# Patient Record
Sex: Male | Born: 1945
Health system: Southern US, Community
[De-identification: ages and names within clinical notes are randomized; demographics above are authoritative.]

## PROBLEM LIST (undated history)

## (undated) DIAGNOSIS — Z974 Presence of external hearing-aid: Secondary | ICD-10-CM

## (undated) DIAGNOSIS — C439 Malignant melanoma of skin, unspecified: Secondary | ICD-10-CM

## (undated) DIAGNOSIS — C61 Malignant neoplasm of prostate: Secondary | ICD-10-CM

## (undated) DIAGNOSIS — K5792 Diverticulitis of intestine, part unspecified, without perforation or abscess without bleeding: Secondary | ICD-10-CM

## (undated) DIAGNOSIS — E785 Hyperlipidemia, unspecified: Secondary | ICD-10-CM

## (undated) DIAGNOSIS — Z973 Presence of spectacles and contact lenses: Secondary | ICD-10-CM

## (undated) DIAGNOSIS — I10 Essential (primary) hypertension: Secondary | ICD-10-CM

## (undated) DIAGNOSIS — R972 Elevated prostate specific antigen [PSA]: Secondary | ICD-10-CM

## (undated) HISTORY — PX: OTHER SURGICAL HISTORY: SHX169

## (undated) HISTORY — DX: Essential (primary) hypertension: I10

## (undated) HISTORY — PX: SPINE SURGERY: SHX786

## (undated) HISTORY — PX: EYE SURGERY: SHX253

## (undated) HISTORY — DX: Hyperlipidemia, unspecified: E78.5

## (undated) HISTORY — DX: Diverticulitis of intestine, part unspecified, without perforation or abscess without bleeding: K57.92

---

## 1997-12-19 ENCOUNTER — Other Ambulatory Visit: Admission: RE | Admit: 1997-12-19 | Discharge: 1997-12-19 | Payer: Self-pay | Admitting: Family Medicine

## 1997-12-19 ENCOUNTER — Encounter: Admission: RE | Admit: 1997-12-19 | Discharge: 1997-12-19 | Payer: Self-pay | Admitting: Sports Medicine

## 1998-05-18 ENCOUNTER — Ambulatory Visit (HOSPITAL_COMMUNITY): Admission: RE | Admit: 1998-05-18 | Discharge: 1998-05-19 | Payer: Self-pay | Admitting: Ophthalmology

## 1998-05-18 ENCOUNTER — Encounter: Payer: Self-pay | Admitting: Ophthalmology

## 1999-01-11 ENCOUNTER — Encounter: Admission: RE | Admit: 1999-01-11 | Discharge: 1999-01-11 | Payer: Self-pay | Admitting: Family Medicine

## 1999-02-23 ENCOUNTER — Encounter: Admission: RE | Admit: 1999-02-23 | Discharge: 1999-02-23 | Payer: Self-pay | Admitting: Family Medicine

## 1999-03-13 ENCOUNTER — Encounter: Admission: RE | Admit: 1999-03-13 | Discharge: 1999-03-13 | Payer: Self-pay | Admitting: Family Medicine

## 1999-06-07 ENCOUNTER — Encounter: Admission: RE | Admit: 1999-06-07 | Discharge: 1999-06-07 | Payer: Self-pay | Admitting: Family Medicine

## 1999-09-07 ENCOUNTER — Encounter: Admission: RE | Admit: 1999-09-07 | Discharge: 1999-09-07 | Payer: Self-pay | Admitting: Family Medicine

## 2000-09-08 ENCOUNTER — Encounter: Admission: RE | Admit: 2000-09-08 | Discharge: 2000-09-08 | Payer: Self-pay | Admitting: Family Medicine

## 2000-09-09 ENCOUNTER — Encounter: Admission: RE | Admit: 2000-09-09 | Discharge: 2000-09-09 | Payer: Self-pay | Admitting: Sports Medicine

## 2001-06-04 ENCOUNTER — Encounter: Admission: RE | Admit: 2001-06-04 | Discharge: 2001-06-04 | Payer: Self-pay | Admitting: Family Medicine

## 2001-06-04 ENCOUNTER — Encounter: Payer: Self-pay | Admitting: Family Medicine

## 2001-06-26 ENCOUNTER — Encounter: Admission: RE | Admit: 2001-06-26 | Discharge: 2001-09-24 | Payer: Self-pay | Admitting: Family Medicine

## 2001-09-21 ENCOUNTER — Encounter: Admission: RE | Admit: 2001-09-21 | Discharge: 2001-09-21 | Payer: Self-pay | Admitting: Family Medicine

## 2001-09-21 ENCOUNTER — Ambulatory Visit (HOSPITAL_COMMUNITY): Admission: RE | Admit: 2001-09-21 | Discharge: 2001-09-21 | Payer: Self-pay | Admitting: Family Medicine

## 2001-09-22 ENCOUNTER — Encounter: Admission: RE | Admit: 2001-09-22 | Discharge: 2001-09-22 | Payer: Self-pay | Admitting: Family Medicine

## 2001-10-20 ENCOUNTER — Encounter: Admission: RE | Admit: 2001-10-20 | Discharge: 2001-10-20 | Payer: Self-pay | Admitting: Family Medicine

## 2001-10-28 ENCOUNTER — Encounter: Payer: Self-pay | Admitting: Neurology

## 2001-10-28 ENCOUNTER — Ambulatory Visit (HOSPITAL_COMMUNITY): Admission: RE | Admit: 2001-10-28 | Discharge: 2001-10-28 | Payer: Self-pay | Admitting: Neurology

## 2001-11-02 ENCOUNTER — Encounter: Admission: RE | Admit: 2001-11-02 | Discharge: 2001-11-02 | Payer: Self-pay | Admitting: Family Medicine

## 2001-11-12 ENCOUNTER — Encounter: Payer: Self-pay | Admitting: Neurology

## 2001-11-12 ENCOUNTER — Ambulatory Visit (HOSPITAL_COMMUNITY): Admission: RE | Admit: 2001-11-12 | Discharge: 2001-11-12 | Payer: Self-pay | Admitting: Neurology

## 2002-09-21 ENCOUNTER — Encounter: Admission: RE | Admit: 2002-09-21 | Discharge: 2002-09-21 | Payer: Self-pay | Admitting: Family Medicine

## 2002-09-24 ENCOUNTER — Encounter: Admission: RE | Admit: 2002-09-24 | Discharge: 2002-09-24 | Payer: Self-pay | Admitting: Family Medicine

## 2003-09-20 ENCOUNTER — Encounter: Admission: RE | Admit: 2003-09-20 | Discharge: 2003-09-20 | Payer: Self-pay | Admitting: Family Medicine

## 2003-09-23 ENCOUNTER — Encounter: Admission: RE | Admit: 2003-09-23 | Discharge: 2003-09-23 | Payer: Self-pay | Admitting: Family Medicine

## 2004-09-17 ENCOUNTER — Ambulatory Visit: Payer: Self-pay | Admitting: Family Medicine

## 2004-09-26 ENCOUNTER — Ambulatory Visit: Payer: Self-pay | Admitting: Family Medicine

## 2004-10-08 ENCOUNTER — Ambulatory Visit: Payer: Self-pay | Admitting: Family Medicine

## 2005-09-04 ENCOUNTER — Ambulatory Visit: Payer: Self-pay | Admitting: Family Medicine

## 2005-09-06 ENCOUNTER — Ambulatory Visit: Payer: Self-pay | Admitting: Family Medicine

## 2005-09-10 ENCOUNTER — Ambulatory Visit (HOSPITAL_COMMUNITY): Admission: RE | Admit: 2005-09-10 | Discharge: 2005-09-10 | Payer: Self-pay | Admitting: Family Medicine

## 2006-01-03 ENCOUNTER — Ambulatory Visit: Payer: Self-pay | Admitting: Family Medicine

## 2006-03-06 ENCOUNTER — Ambulatory Visit: Payer: Self-pay | Admitting: Family Medicine

## 2006-09-18 DIAGNOSIS — G8929 Other chronic pain: Secondary | ICD-10-CM | POA: Insufficient documentation

## 2006-09-18 DIAGNOSIS — M5382 Other specified dorsopathies, cervical region: Secondary | ICD-10-CM

## 2006-09-18 DIAGNOSIS — N400 Enlarged prostate without lower urinary tract symptoms: Secondary | ICD-10-CM | POA: Insufficient documentation

## 2006-09-18 DIAGNOSIS — I10 Essential (primary) hypertension: Secondary | ICD-10-CM | POA: Insufficient documentation

## 2006-09-18 DIAGNOSIS — K5732 Diverticulitis of large intestine without perforation or abscess without bleeding: Secondary | ICD-10-CM | POA: Insufficient documentation

## 2006-09-18 DIAGNOSIS — M545 Low back pain: Secondary | ICD-10-CM

## 2006-09-18 DIAGNOSIS — E785 Hyperlipidemia, unspecified: Secondary | ICD-10-CM | POA: Insufficient documentation

## 2006-09-18 DIAGNOSIS — E7800 Pure hypercholesterolemia, unspecified: Secondary | ICD-10-CM

## 2006-09-18 HISTORY — DX: Pure hypercholesterolemia, unspecified: E78.00

## 2006-09-18 HISTORY — DX: Diverticulitis of large intestine without perforation or abscess without bleeding: K57.32

## 2006-09-18 HISTORY — DX: Other specified dorsopathies, cervical region: M53.82

## 2006-09-29 ENCOUNTER — Encounter: Payer: Self-pay | Admitting: Family Medicine

## 2006-09-29 ENCOUNTER — Ambulatory Visit: Payer: Self-pay | Admitting: Family Medicine

## 2006-09-29 LAB — CONVERTED CEMR LAB
ALT: 10 units/L (ref 0–53)
AST: 16 units/L (ref 0–37)
Albumin: 4.2 g/dL (ref 3.5–5.2)
Alkaline Phosphatase: 76 units/L (ref 39–117)
BUN: 15 mg/dL (ref 6–23)
CO2: 25 meq/L (ref 19–32)
Calcium: 9.2 mg/dL (ref 8.4–10.5)
Chloride: 107 meq/L (ref 96–112)
Cholesterol: 139 mg/dL (ref 0–200)
Creatinine, Ser: 0.96 mg/dL (ref 0.40–1.50)
Glucose, Bld: 107 mg/dL — ABNORMAL HIGH (ref 70–99)
HDL: 48 mg/dL (ref >39)
LDL Cholesterol: 77 mg/dL (ref 0–99)
PSA: 2 ng/mL (ref 0.10–4.00)
Potassium: 4.4 meq/L (ref 3.5–5.3)
Sodium: 145 meq/L (ref 135–145)
Total Bilirubin: 0.7 mg/dL (ref 0.3–1.2)
Total CHOL/HDL Ratio: 2.9
Total Protein: 6.7 g/dL (ref 6.0–8.3)
Triglycerides: 72 mg/dL (ref <150)
VLDL: 14 mg/dL (ref 0–40)

## 2006-10-03 ENCOUNTER — Ambulatory Visit: Payer: Self-pay | Admitting: Family Medicine

## 2007-03-12 ENCOUNTER — Telehealth: Payer: Self-pay | Admitting: Family Medicine

## 2007-08-13 ENCOUNTER — Telehealth: Payer: Self-pay | Admitting: *Deleted

## 2007-09-02 ENCOUNTER — Ambulatory Visit: Payer: Self-pay | Admitting: Family Medicine

## 2007-09-02 LAB — CONVERTED CEMR LAB
ALT: 14 units/L (ref 0–53)
AST: 21 units/L (ref 0–37)
Albumin: 4.5 g/dL (ref 3.5–5.2)
Alkaline Phosphatase: 69 units/L (ref 39–117)
BUN: 13 mg/dL (ref 6–23)
CO2: 29 meq/L (ref 19–32)
Calcium: 9.5 mg/dL (ref 8.4–10.5)
Chloride: 105 meq/L (ref 96–112)
Cholesterol: 161 mg/dL (ref 0–200)
Creatinine, Ser: 1.01 mg/dL (ref 0.40–1.50)
Glucose, Bld: 106 mg/dL — ABNORMAL HIGH (ref 70–99)
HDL: 49 mg/dL (ref >39)
LDL Cholesterol: 96 mg/dL (ref 0–99)
PSA: 2.54 ng/mL (ref 0.10–4.00)
Potassium: 4.8 meq/L (ref 3.5–5.3)
Sodium: 142 meq/L (ref 135–145)
Total Bilirubin: 1.1 mg/dL (ref 0.3–1.2)
Total CHOL/HDL Ratio: 3.3
Total Protein: 7.2 g/dL (ref 6.0–8.3)
Triglycerides: 81 mg/dL (ref <150)
VLDL: 16 mg/dL (ref 0–40)

## 2007-09-07 ENCOUNTER — Ambulatory Visit: Payer: Self-pay | Admitting: Family Medicine

## 2007-11-16 ENCOUNTER — Telehealth: Payer: Self-pay | Admitting: Family Medicine

## 2008-10-04 ENCOUNTER — Telehealth: Payer: Self-pay | Admitting: Family Medicine

## 2008-10-19 ENCOUNTER — Ambulatory Visit: Payer: Self-pay | Admitting: Family Medicine

## 2008-10-19 ENCOUNTER — Encounter: Payer: Self-pay | Admitting: Family Medicine

## 2008-10-19 LAB — CONVERTED CEMR LAB
ALT: 14 units/L (ref 0–53)
AST: 23 units/L (ref 0–37)
Albumin: 4.3 g/dL (ref 3.5–5.2)
Alkaline Phosphatase: 74 units/L (ref 39–117)
BUN: 16 mg/dL (ref 6–23)
CO2: 26 meq/L (ref 19–32)
Calcium: 9.5 mg/dL (ref 8.4–10.5)
Chloride: 102 meq/L (ref 96–112)
Cholesterol: 165 mg/dL (ref 0–200)
Creatinine, Ser: 1.07 mg/dL (ref 0.40–1.50)
Glucose, Bld: 99 mg/dL (ref 70–99)
HCT: 45.1 % (ref 39.0–52.0)
HDL: 59 mg/dL (ref >39)
Hemoglobin: 15.1 g/dL (ref 13.0–17.0)
LDL Cholesterol: 93 mg/dL (ref 0–99)
MCHC: 33.5 g/dL (ref 30.0–36.0)
MCV: 96.8 fL (ref 78.0–100.0)
PSA: 2.74 ng/mL (ref 0.10–4.00)
Platelets: 168 10*3/uL (ref 150–400)
Potassium: 4.2 meq/L (ref 3.5–5.3)
RBC: 4.66 M/uL (ref 4.22–5.81)
RDW: 13.6 % (ref 11.5–15.5)
Sodium: 142 meq/L (ref 135–145)
Total Bilirubin: 1.2 mg/dL (ref 0.3–1.2)
Total CHOL/HDL Ratio: 2.8
Total Protein: 7.1 g/dL (ref 6.0–8.3)
Triglycerides: 65 mg/dL (ref <150)
VLDL: 13 mg/dL (ref 0–40)
Vit D, 25-Hydroxy: 22 ng/mL — ABNORMAL LOW (ref 30–89)
WBC: 5.3 10*3/uL (ref 4.0–10.5)

## 2008-10-26 ENCOUNTER — Ambulatory Visit: Payer: Self-pay | Admitting: Family Medicine

## 2008-10-26 DIAGNOSIS — L570 Actinic keratosis: Secondary | ICD-10-CM | POA: Insufficient documentation

## 2008-10-26 DIAGNOSIS — N529 Male erectile dysfunction, unspecified: Secondary | ICD-10-CM | POA: Insufficient documentation

## 2008-10-26 HISTORY — DX: Male erectile dysfunction, unspecified: N52.9

## 2008-10-26 HISTORY — DX: Actinic keratosis: L57.0

## 2008-10-27 ENCOUNTER — Encounter: Payer: Self-pay | Admitting: Family Medicine

## 2008-10-27 ENCOUNTER — Ambulatory Visit: Payer: Self-pay | Admitting: Family Medicine

## 2009-01-13 ENCOUNTER — Encounter: Payer: Self-pay | Admitting: Family Medicine

## 2010-03-19 ENCOUNTER — Encounter: Payer: Self-pay | Admitting: Family Medicine

## 2010-07-26 ENCOUNTER — Encounter: Payer: Self-pay | Admitting: Family Medicine

## 2010-07-31 ENCOUNTER — Ambulatory Visit: Admission: RE | Admit: 2010-07-31 | Discharge: 2010-07-31 | Payer: Self-pay | Source: Home / Self Care

## 2010-08-01 ENCOUNTER — Encounter: Payer: Self-pay | Admitting: Family Medicine

## 2010-08-01 LAB — CONVERTED CEMR LAB
ALT: 18 U/L
AST: 23 U/L
Albumin: 4.6 g/dL
Alkaline Phosphatase: 77 U/L
BUN: 18 mg/dL
CO2: 29 meq/L
Calcium: 9.7 mg/dL
Chloride: 105 meq/L
Cholesterol: 210 mg/dL — ABNORMAL HIGH
Creatinine, Ser: 1.07 mg/dL
Glucose, Bld: 107 mg/dL — ABNORMAL HIGH
HCT: 48.6 %
HDL: 54 mg/dL
Hemoglobin: 15.3 g/dL
LDL Cholesterol: 137 mg/dL — ABNORMAL HIGH
MCHC: 31.5 g/dL
MCV: 99.2 fL
PSA: 2.23 ng/mL
Platelets: 189 K/uL
Potassium: 4.4 meq/L
RBC: 4.9 M/uL
RDW: 13.7 %
Sodium: 143 meq/L
Total Bilirubin: 0.9 mg/dL
Total CHOL/HDL Ratio: 3.9
Total Protein: 7.1 g/dL
Triglycerides: 93 mg/dL
VLDL: 19 mg/dL
WBC: 6.1 10*3/microliter

## 2010-08-03 ENCOUNTER — Ambulatory Visit
Admission: RE | Admit: 2010-08-03 | Discharge: 2010-08-03 | Payer: Self-pay | Source: Home / Self Care | Attending: Family Medicine | Admitting: Family Medicine

## 2010-08-21 NOTE — Miscellaneous (Signed)
Summary: Rx refills  Clinical Lists Changes Called and given refills.  [place] Medications: Changed medication from ACYCLOVIR 800 MG TABS (ACYCLOVIR) Take 1 tablet by mouth twice a day to ACYCLOVIR 800 MG TABS (ACYCLOVIR) Take 1 tablet by mouth twice a day - Signed Changed medication from CIALIS 20 MG TABS (TADALAFIL) One by mouth daily as needed. to CIALIS 20 MG TABS (TADALAFIL) One by mouth daily as needed. - Signed Rx of ACYCLOVIR 800 MG TABS (ACYCLOVIR) Take 1 tablet by mouth twice a day;  #60 x 4;  Signed;  Entered by: Doralee Albino MD;  Authorized by: Doralee Albino MD;  Method used: [facility] to [facility]  [phone]*, [address] [place], [place]  [address], Ph: [phone] or [phone], Fax: [phone] Rx of CIALIS 20 MG TABS (TADALAFIL) One by mouth daily as needed.;  #6 x 12;  Signed;  Entered by: Doralee Albino MD;  Authorized by: Doralee Albino MD;  Method used: Print then Give to Patient    Prescriptions: CIALIS 20 MG TABS (TADALAFIL) One by mouth daily as needed.  #6 x 12   Entered and Authorized by:   Doralee Albino MD   Signed by:   Doralee Albino MD on 03/19/2010   Method used:   Print then Give to Patient   RxID:   [id number] ACYCLOVIR 800 MG TABS (ACYCLOVIR) Take 1 tablet by mouth twice a day  #60 x 4   Entered and Authorized by:   Doralee Albino MD   Signed by:   Doralee Albino MD on 03/19/2010   Method used:   Electronically to        [facility]  [phone]* (retail)       [address] [place], [place]  [address]       Ph: [phone] or [phone]       Fax: [phone]   RxID:   [id number]

## 2010-08-23 ENCOUNTER — Encounter: Payer: Self-pay | Admitting: *Deleted

## 2010-08-23 NOTE — Miscellaneous (Signed)
Summary: Lab orders before physical  Clinical Lists Changes  Problems: Removed problem of NEED PROPH VACCINATION&INOCULAT OTH VIRAL DZ (ICD-V04.89) Removed problem of IMPAIRED FASTING GLUCOSE (ICD-790.21) Removed problem of ROUTINE GENERAL MEDICAL EXAM@HEALTH  CARE FACL (ICD-V70.0) Removed problem of AFTERCARE, LONG-TERM USE, MEDICATIONS NEC (ICD-V58.69) Removed problem of SCREENING FOR MALIGNANT NEOPLASM, PROSTATE (ICD-V76.44) Orders: Added new Test order of Comp Met-[facility] [phone]) - Signed Added new Test order of Lipid-[facility] [phone]) - Signed Added new Test order of CBC-[facility] ([id number]) - Signed Added new Test order of PSA-[facility] ([id number]) - Signed

## 2010-08-23 NOTE — Assessment & Plan Note (Signed)
Summary: cpe/eo   Vital Signs:  Patient profile:   65 year old male Height:      77 inches Weight:      205.7 pounds BMI:     24.48 Temp:     97.9 degrees F oral Pulse rate:   59 / minute BP sitting:   152 / 82  (left arm) Cuff size:   regular  Vitals Entered By: Jimmy Footman, CMA (August 03, 2010 8:59 AM) CC: cpe Is Patient Diabetic? No Pain Assessment Patient in pain? no        CC:  cpe.  History of Present Illness: Brings in BP sheet.  Home BPs are great.  BP in vitals was rushed. Younger brother had sudden cardiac death 3 weeks ago - revisit risk factors.  He has always bruised easily and therefore I have previously avoided ASA for primary prevention of CAD Review blood work done before PE Skin lesions need checked. Had one bout of diverticulitis based on symptoms - took augmentin and symptoms resolved over  ~1 week.   LDL up a bit - admits to period of unhealthy eating and does not take lipitor daily.  He will take regularly  Habits & Providers  Alcohol-Tobacco-Diet     Alcohol drinks/day: <1     Tobacco Status: never     Diet Comments: health  Exercise-Depression-Behavior     Does Patient Exercise: yes     Exercise Counseling: not indicated; exercise is adequate     Type of exercise: treadmill     Exercise (avg: min/session): 30-60     Times/week: 5     Have you felt down or hopeless? no     Have you felt little pleasure in things? no     Depression Counseling: not indicated; screening negative for depression     STD Risk: never     Drug Use: never     Seat Belt Use: always     Sun Exposure: infrequent  Current Medications (verified): 1)  Acyclovir 800 Mg Tabs (Acyclovir) .... Take 1 Tablet By Mouth Twice A Day 2)  Amoxicillin-Pot Clavulanate 875-125 Mg Tabs (Amoxicillin-Pot Clavulanate) .... Take 1 Tablet By Mouth Twice A Day 3)  Diazepam 5 Mg Tabs (Diazepam) .Marland Kitchen.. 1 Tablet By Mouth At Bedtime 4)  Lipitor 10 Mg Tabs (Atorvastatin Calcium) .... Take 1  Tablet By Mouth At Bedtime 5)  Cialis 20 Mg Tabs (Tadalafil) .... One By Mouth Daily As Needed.  Allergies (verified): No Known Drug Allergies  Past History:  Past medical, surgical, family and social histories (including risk factors) reviewed, and no changes noted (except as noted below).  Past Medical History: Reviewed history from 10/03/2006 and no changes required. bleeds easily but normal coags , Recurrent herpes stomatitis Has been screened 2007 for AAA due to pos FHx  Past Surgical History: Reviewed history from 09/18/2006 and no changes required. IVP and cysto 1999 normal -, lumbar laminectomy/diskectomy 1996 -, stress [phone] normal -  Family History: Reviewed history from 10/03/2006 and no changes required. - DM, Ca,  + HBP, obesity, CHF, strongly positive for abd aortic aneurism, weakly + Altzheimers, CAD  Social History: Reviewed history from 09/07/2007 and no changes required. nonsmoker, insignificant ETOH; healthy diet; exercises on treadmill 7d/wkSTD Risk:  never Drug Use:  never Seat Belt Use:  always Sun Exposure-Excessive:  infrequent  Review of Systems  The patient denies chest pain, dyspnea on exertion, peripheral edema, abdominal pain, unusual weight change, and abnormal bleeding.  Physical Exam  General:  Well-developed,well-nourished,in no acute distress; alert,appropriate and cooperative throughout examination Head:  actinic keratosis on scalp, frozen Eyes:  No corneal or conjunctival inflammation noted. EOMI. Perrla. Funduscopic exam benign, without hemorrhages, exudates or papilledema. Vision grossly normal. Ears:  External ear exam shows no significant lesions or deformities.  Otoscopic examination reveals clear canals, tympanic membranes are intact bilaterally without bulging, retraction, inflammation or discharge. Hearing is grossly normal bilaterally. Neck:  No deformities, masses, or tenderness noted. Lungs:  Normal respiratory effort,  chest expands symmetrically. Lungs are clear to auscultation, no crackles or wheezes. Heart:  Normal rate and regular rhythm. S1 and S2 normal without gallop, murmur, click, rub or other extra sounds. Abdomen:  Bowel sounds positive,abdomen soft and non-tender without masses, organomegaly or hernias noted. Extremities:  No clubbing, cyanosis, edema, or deformity noted with normal full range of motion of all joints.   Neurologic:  No cranial nerve deficits noted. Station and gait are normal. Plantar reflexes are down-going bilaterally. DTRs are symmetrical throughout. Sensory, motor and coordinative functions appear intact.   Impression & Recommendations:  Problem # 1:  Preventive Health Care (ICD-V70.0)  Problem # 2:  HYPERTENSION, BENIGN SYSTEMIC (ICD-401.1) Add daily 81 mg ASA for primary prevention of CAD. No change - follow home BPs  BP today: 152/82 Prior BP: 119/71 (10/26/2008)  Labs Reviewed: K+: 4.4 (08/01/2010) Creat: : 1.07 (08/01/2010)   Chol: 210 (08/01/2010)   HDL: 54 (08/01/2010)   LDL: 137 (08/01/2010)   TG: 93 (08/01/2010)  Problem # 3:  HYPERCHOLESTEROLEMIA (ICD-272.0) Better compliance with daily lipitor, recheck LDL in 3 months. His updated medication list for this problem includes:    Lipitor 10 Mg Tabs (Atorvastatin calcium) .Marland Kitchen... Take 1 tablet by mouth at bedtime  Future Orders: Direct LDL-[facility] ([id number]) ... 07/25/2011  Complete Medication List: 1)  Acyclovir 800 Mg Tabs (Acyclovir) .... Take 1 tablet by mouth twice a day 2)  Amoxicillin-pot Clavulanate 875-125 Mg Tabs (Amoxicillin-pot clavulanate) .... Take 1 tablet by mouth twice a day 3)  Diazepam 5 Mg Tabs (Diazepam) .Marland Kitchen.. 1 tablet by mouth at bedtime 4)  Lipitor 10 Mg Tabs (Atorvastatin calcium) .... Take 1 tablet by mouth at bedtime 5)  Cialis 20 Mg Tabs (Tadalafil) .... One by mouth daily as needed. 6)  Aspirin 81 Mg Tbec (Aspirin) .... One by mouth every day  Other Orders: [facility] - Est  40-64 yrs  ([address]) Prescriptions: ACYCLOVIR 800 MG TABS (ACYCLOVIR) Take 1 tablet by mouth twice a day  #60 x 3   Entered and Authorized by:   Doralee Albino MD   Signed by:   Doralee Albino MD on 08/03/2010   Method used:   Handwritten   RxID:   [id number] AMOXICILLIN-POT CLAVULANATE 875-125 MG TABS (AMOXICILLIN-POT CLAVULANATE) Take 1 tablet by mouth twice a day  #20 x 3   Entered and Authorized by:   Doralee Albino MD   Signed by:   Doralee Albino MD on 08/03/2010   Method used:   Handwritten   RxID:   [id number]    Orders Added: 1)  Direct LDL-[facility] [[id number]] 2)  [facility] - Est  40-64 yrs [[id number]]

## 2011-02-28 ENCOUNTER — Other Ambulatory Visit: Payer: Self-pay | Admitting: Family Medicine

## 2011-02-28 DIAGNOSIS — N529 Male erectile dysfunction, unspecified: Secondary | ICD-10-CM

## 2011-02-28 MED ORDER — VARDENAFIL HCL 10 MG PO TABS: 10.0000 mg | ORAL_TABLET | Freq: Every day | ORAL | Status: DC | PRN

## 2011-02-28 NOTE — Assessment & Plan Note (Signed)
Switch from cialis to levitra due to cost

## 2011-03-01 ENCOUNTER — Other Ambulatory Visit: Payer: Self-pay | Admitting: Family Medicine

## 2011-03-01 DIAGNOSIS — N529 Male erectile dysfunction, unspecified: Secondary | ICD-10-CM

## 2011-03-01 MED ORDER — VARDENAFIL HCL 20 MG PO TABS: 20.0000 mg | ORAL_TABLET | ORAL | Status: DC | PRN

## 2011-03-01 NOTE — Assessment & Plan Note (Signed)
Inexplicably, cost is lower for higher dose.  He will use 20 mg dose, perhaps cutting in half

## 2011-05-03 ENCOUNTER — Telehealth: Payer: Self-pay | Admitting: Family Medicine

## 2011-05-03 MED ORDER — AMOXICILLIN-POT CLAVULANATE 875-125 MG PO TABS: 1.0000 | ORAL_TABLET | Freq: Two times a day (BID) | ORAL | Status: DC

## 2011-05-03 NOTE — Telephone Encounter (Signed)
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  Is aware Dr. Leveda Anna is out of town.  Would like you to check with Dr. Sheffield Slider to have him send to [facility] at [facility].  Would like Dr. Sheffield Slider to call him at home if he need to discuss further.

## 2011-05-03 NOTE — Telephone Encounter (Signed)
Will forward to Dr Hale 

## 2011-05-03 NOTE — Telephone Encounter (Signed)
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 His Aumentin Rx is old. His colonoscopy in 2007 showed diverticulosis only and was to be repeated in 10 years. I am refilling his Augmentin and he will come to see Dr Leveda Anna if his symptoms are not improved in a few days.

## 2011-08-01 ENCOUNTER — Telehealth: Payer: Self-pay | Admitting: Family Medicine

## 2011-08-01 DIAGNOSIS — E7800 Pure hypercholesterolemia, unspecified: Secondary | ICD-10-CM

## 2011-08-01 DIAGNOSIS — I10 Essential (primary) hypertension: Secondary | ICD-10-CM

## 2011-08-01 NOTE — Telephone Encounter (Signed)
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 Leveda Anna would like ordered Monday.  There are no orders though.

## 2011-08-02 NOTE — Assessment & Plan Note (Signed)
Check labs prior to physical

## 2011-08-05 ENCOUNTER — Other Ambulatory Visit: Payer: BC Managed Care – PPO

## 2011-08-05 DIAGNOSIS — I10 Essential (primary) hypertension: Secondary | ICD-10-CM

## 2011-08-05 DIAGNOSIS — E7800 Pure hypercholesterolemia, unspecified: Secondary | ICD-10-CM

## 2011-08-05 LAB — LIPID PANEL
Cholesterol: 163 mg/dL (ref 0–200)
LDL Cholesterol: 99 mg/dL (ref 0–99)
Triglycerides: 72 mg/dL (ref <150)
VLDL: 14 mg/dL (ref 0–40)

## 2011-08-05 LAB — CBC
MCH: 31.6 pg (ref 26.0–34.0)
MCHC: 32.2 g/dL (ref 30.0–36.0)
RDW: 13.2 % (ref 11.5–15.5)

## 2011-08-05 LAB — COMPLETE METABOLIC PANEL WITH GFR
ALT: 11 U/L (ref 0–53)
AST: 27 U/L (ref 0–37)
Albumin: 4.2 g/dL (ref 3.5–5.2)
Alkaline Phosphatase: 72 U/L (ref 39–117)
BUN: 18 mg/dL (ref 6–23)
Creat: 1 mg/dL (ref 0.50–1.35)
GFR, Est African American: >89 mL/min
GFR, Est Non African American: 79 mL/min
Total Bilirubin: 0.8 mg/dL (ref 0.3–1.2)
Total Protein: 6.6 g/dL (ref 6.0–8.3)

## 2011-08-05 NOTE — Progress Notes (Signed)
Cmp, cbc and flp done today Alan Gonzalez 

## 2011-08-06 ENCOUNTER — Encounter: Payer: Self-pay | Admitting: Family Medicine

## 2011-08-09 ENCOUNTER — Encounter: Payer: Self-pay | Admitting: Family Medicine

## 2011-08-09 ENCOUNTER — Ambulatory Visit (INDEPENDENT_AMBULATORY_CARE_PROVIDER_SITE_OTHER): Payer: BC Managed Care – PPO | Admitting: Family Medicine

## 2011-08-09 VITALS — BP 138/85 | HR 65 | Resp 12 | Ht 77.0 in | Wt 200.0 lb

## 2011-08-09 DIAGNOSIS — I10 Essential (primary) hypertension: Secondary | ICD-10-CM

## 2011-08-09 DIAGNOSIS — K5732 Diverticulitis of large intestine without perforation or abscess without bleeding: Secondary | ICD-10-CM

## 2011-08-09 DIAGNOSIS — E7800 Pure hypercholesterolemia, unspecified: Secondary | ICD-10-CM

## 2011-08-09 DIAGNOSIS — Z23 Encounter for immunization: Secondary | ICD-10-CM

## 2011-08-09 MED ORDER — ACYCLOVIR 800 MG PO TABS: 800.0000 mg | ORAL_TABLET | Freq: Two times a day (BID) | ORAL | Status: DC

## 2011-08-09 MED ORDER — OLOPATADINE HCL 0.1 % OP SOLN: 1.0000 [drp] | Freq: Two times a day (BID) | OPHTHALMIC | Status: AC

## 2011-08-09 NOTE — Progress Notes (Signed)
  Subjective:    Patient ID: Alan Gonzalez, male    DOB: October 14, 1945, 66 y.o.   MRN: [id number]  HPI  Annual physical.  Doing great.  No complaints.  He has really done great with his back pain with no flairs for several years.  Fit, active and eats a healthy diet. Has occasional flair of oral herpes simplex - cont acyclovir Had one flair of diverticulitis in Oct which resolved in 3-4 days with augmentin. Up to date on health maint except needs tetanus Labs from earlier this week all good.  Cont current treatments. Discussed PSA.  Will no longer test given his age and USPSTF recs.    Review of Systems Denies CP, SOB, DOE, bleeding or stool changes     Objective:   Physical Exam HEENT nl Neck supple Lungs clear Cardiac RRR without m or g Abd benign Ext nl       Assessment & Plan:

## 2011-08-09 NOTE — Assessment & Plan Note (Signed)
Home BP record reveils well controled with diet and exercise

## 2011-08-09 NOTE — Assessment & Plan Note (Signed)
Cont current treatment.

## 2011-08-09 NOTE — Patient Instructions (Signed)
Great work with diet and exercise. Stay on all the same meds You received a tetanus shot today.

## 2011-08-09 NOTE — Assessment & Plan Note (Signed)
Well controled on current meds.  He is primary prevention

## 2012-03-16 ENCOUNTER — Other Ambulatory Visit: Payer: Self-pay | Admitting: Family Medicine

## 2012-03-16 MED ORDER — ATORVASTATIN CALCIUM 10 MG PO TABS: 10.0000 mg | ORAL_TABLET | Freq: Every day | ORAL | Status: DC

## 2012-03-16 MED ORDER — AMOXICILLIN-POT CLAVULANATE 875-125 MG PO TABS: 1.0000 | ORAL_TABLET | Freq: Two times a day (BID) | ORAL | Status: DC

## 2012-03-16 MED ORDER — ACYCLOVIR 800 MG PO TABS: 800.0000 mg | ORAL_TABLET | Freq: Two times a day (BID) | ORAL | Status: DC

## 2012-04-09 ENCOUNTER — Ambulatory Visit (INDEPENDENT_AMBULATORY_CARE_PROVIDER_SITE_OTHER): Payer: Medicare Other | Admitting: *Deleted

## 2012-04-09 ENCOUNTER — Ambulatory Visit: Payer: BC Managed Care – PPO | Admitting: Home Health Services

## 2012-04-09 DIAGNOSIS — Z23 Encounter for immunization: Secondary | ICD-10-CM

## 2012-04-22 ENCOUNTER — Ambulatory Visit (INDEPENDENT_AMBULATORY_CARE_PROVIDER_SITE_OTHER): Payer: Medicare Other | Admitting: Home Health Services

## 2012-04-22 ENCOUNTER — Encounter: Payer: Self-pay | Admitting: Home Health Services

## 2012-04-22 VITALS — BP 143/89 | HR 53 | Temp 97.6°F | Ht 77.0 in | Wt 197.6 lb

## 2012-04-22 DIAGNOSIS — Z139 Encounter for screening, unspecified: Secondary | ICD-10-CM

## 2012-04-22 DIAGNOSIS — Z0000 Encounter for general adult medical examination without abnormal findings: Secondary | ICD-10-CM | POA: Diagnosis not present

## 2012-04-22 NOTE — Progress Notes (Signed)
Patient here for annual wellness visit, patient reports: Risk Factors/Conditions needing evaluation or treatment: Pt does not have any new risk factors that need evaluation. Home Safety: Pt lives with wife in 2 story home.  Pt reports having smoke detectors and adaptive equipment in bathroom. Other Information: Corrective lens: Pt wears daily corrective lens.  Pt has annual eye exams. Dentures: Pt does not have dentures.  Pt has annual dental exams. Memory: Pt denies memory problems. Patient's Mini Mental Score (recorded in doc. flowsheet): 30  Balance/Gait: Pt does not have any notictable impairment Balance Abnormal Patient value  Sitting balance    Sit to stand    Attempts to arise    Immediate standing balance    Standing balance    Nudge    Eyes closed- Romberg    Tandem stance    Back lean    Neck Rotation    360 degree turn    Sitting down     Gait Abnormal Patient value  Initiation of gait    Step length-left    Step length-right    Step height-left    Step height-right    Step symmetry    Step continuity    Path deviation    Trunk movement    Walking stance        Annual Wellness Visit Requirements Recorded Today In  Medical, family, social history Past Medical, Family, Social History Section  Current providers Care team  Current medications Medications  Wt, BP, Ht, BMI Vital signs  Visual acuity (welcome visit) Hearing/vision  Hearing assessment (welcome visit) Hearing/vision  Tobacco, alcohol, illicit drug use History  ADL Nurse Assessment  Depression Screening Nurse Assessment  Cognitive impairment Nurse Assessment  Mini Mental Status Document Flowsheet  Fall Risk Nurse Assessment  Home Safety Progress Note  End of Life Planning (welcome visit) Social Documentation  Medicare preventative services Progress Note  Risk factors/conditions needing evaluation/treatment Progress Note  Personalized health advice Patient Instructions, goals, letter  Diet &  Exercise Social Documentation  Emergency Contact Social Documentation  Seat Belts Social Documentation  Sun exposure/protection Social Documentation    Medicare Prevention Plan:   Recommended Medicare Prevention Screenings Men over 65 Test For Frequency Date of Last- BOLD if needed  Colorectal Cancer 1-10 yrs 3/07  Prostate Cancer Never or yearly Discuss with PCP if concerned  Aortic Aneurysm Once if 65-75 with hx of smoking Pt reported done 5 years ago  Cholesterol 5 yrs 1/13  Diabetes yearly 1/13  HIV yearly delcined  Influenza Shot yearly 9/13  Pneumonia Shot once Pt will consider   Zostavax Shot once 4/10

## 2012-04-23 ENCOUNTER — Encounter: Payer: Self-pay | Admitting: Home Health Services

## 2012-04-23 NOTE — Patient Instructions (Signed)
1. Continue exercising 30 minutes daily.  Consider incorporating strengthening exercises into your routine. 2. Consider getting pneumococcal vaccine.  3. Continue eating balance diet of vegetables/fruits, lean protein, and some starches. 4. Follow up with Dr. Leveda Anna around February for lipid check.

## 2012-04-23 NOTE — Progress Notes (Signed)
Patient ID: Alan Gonzalez, male   DOB: [birth year], 66 y.o.   MRN: [id number] I have reviewed this visit and discussed with Arlys John and agree with her documentation.

## 2012-05-01 DIAGNOSIS — H02059 Trichiasis without entropian unspecified eye, unspecified eyelid: Secondary | ICD-10-CM | POA: Diagnosis not present

## 2012-05-01 DIAGNOSIS — H1045 Other chronic allergic conjunctivitis: Secondary | ICD-10-CM | POA: Diagnosis not present

## 2012-05-01 DIAGNOSIS — H2510 Age-related nuclear cataract, unspecified eye: Secondary | ICD-10-CM | POA: Diagnosis not present

## 2012-05-26 ENCOUNTER — Other Ambulatory Visit: Payer: Self-pay | Admitting: Family Medicine

## 2012-05-26 DIAGNOSIS — N529 Male erectile dysfunction, unspecified: Secondary | ICD-10-CM

## 2012-05-26 MED ORDER — VARDENAFIL HCL 20 MG PO TABS: 20.0000 mg | ORAL_TABLET | ORAL | Status: DC | PRN

## 2012-07-10 DIAGNOSIS — L03119 Cellulitis of unspecified part of limb: Secondary | ICD-10-CM | POA: Diagnosis not present

## 2012-07-10 DIAGNOSIS — L02419 Cutaneous abscess of limb, unspecified: Secondary | ICD-10-CM | POA: Diagnosis not present

## 2012-08-17 ENCOUNTER — Other Ambulatory Visit: Payer: Self-pay | Admitting: Family Medicine

## 2012-08-17 DIAGNOSIS — E7800 Pure hypercholesterolemia, unspecified: Secondary | ICD-10-CM

## 2012-08-17 DIAGNOSIS — I10 Essential (primary) hypertension: Secondary | ICD-10-CM

## 2012-08-17 NOTE — Progress Notes (Signed)
Seeing me in 5 days.  Ordered fasting blood work.  Called and notified.

## 2012-08-20 ENCOUNTER — Other Ambulatory Visit: Payer: Medicare Other

## 2012-08-20 DIAGNOSIS — E7800 Pure hypercholesterolemia, unspecified: Secondary | ICD-10-CM

## 2012-08-20 LAB — LIPID PANEL
LDL Cholesterol: 150 mg/dL — ABNORMAL HIGH (ref 0–99)
Triglycerides: 63 mg/dL (ref <150)
VLDL: 13 mg/dL (ref 0–40)

## 2012-08-20 LAB — COMPLETE METABOLIC PANEL WITH GFR
ALT: 12 U/L (ref 0–53)
AST: 22 U/L (ref 0–37)
Albumin: 4.3 g/dL (ref 3.5–5.2)
CO2: 29 mEq/L (ref 19–32)
Calcium: 9.3 mg/dL (ref 8.4–10.5)
Chloride: 104 mEq/L (ref 96–112)
Creat: 1.01 mg/dL (ref 0.50–1.35)
GFR, Est African American: >89 mL/min
Potassium: 4.2 mEq/L (ref 3.5–5.3)

## 2012-08-20 NOTE — Progress Notes (Signed)
CMP DONE TODAY Alan Gonzalez 

## 2012-08-20 NOTE — Progress Notes (Signed)
FLP DRAWN BAJORDAN, MLS

## 2012-08-20 NOTE — Addendum Note (Signed)
Addended by: [place], [place] Yeats on: 08/20/2012 11:14 AM   Modules accepted: Orders

## 2012-08-21 ENCOUNTER — Ambulatory Visit (INDEPENDENT_AMBULATORY_CARE_PROVIDER_SITE_OTHER): Payer: Medicare Other | Admitting: Family Medicine

## 2012-08-21 ENCOUNTER — Encounter: Payer: Self-pay | Admitting: Family Medicine

## 2012-08-21 VITALS — BP 133/83 | HR 50 | Temp 98.1°F | Ht 77.0 in | Wt 198.5 lb

## 2012-08-21 DIAGNOSIS — Z23 Encounter for immunization: Secondary | ICD-10-CM

## 2012-08-21 DIAGNOSIS — L989 Disorder of the skin and subcutaneous tissue, unspecified: Secondary | ICD-10-CM | POA: Insufficient documentation

## 2012-08-21 DIAGNOSIS — L03116 Cellulitis of left lower limb: Secondary | ICD-10-CM

## 2012-08-21 DIAGNOSIS — L02419 Cutaneous abscess of limb, unspecified: Secondary | ICD-10-CM | POA: Diagnosis not present

## 2012-08-21 DIAGNOSIS — I10 Essential (primary) hypertension: Secondary | ICD-10-CM

## 2012-08-21 DIAGNOSIS — E7800 Pure hypercholesterolemia, unspecified: Secondary | ICD-10-CM

## 2012-08-21 DIAGNOSIS — L03119 Cellulitis of unspecified part of limb: Secondary | ICD-10-CM | POA: Diagnosis not present

## 2012-08-21 HISTORY — DX: Cellulitis of left lower limb: L03.116

## 2012-08-21 MED ORDER — DOXYCYCLINE HYCLATE 100 MG PO TABS: 100.0000 mg | ORAL_TABLET | Freq: Two times a day (BID) | ORAL | Status: DC

## 2012-08-21 NOTE — Assessment & Plan Note (Signed)
Two worrisome skin lesions for biopsy (see PE)

## 2012-08-21 NOTE — Patient Instructions (Addendum)
Start back on your lipitor. Watch the BP.  The conservative numbers for home blood pressure treatment cutoff are 130/80. Make an appointment soon for the skin lesion removal. Sign up for my chart. Pick up the antibiotic for your leg

## 2012-08-21 NOTE — Assessment & Plan Note (Addendum)
Doxy, Mild, no evidence of osteo

## 2012-08-21 NOTE — Progress Notes (Signed)
  Subjective:    Patient ID: Alan Gonzalez, male    DOB: Mar 19, 1946, 67 y.o.   MRN: [id number]  HPI Here for review of chol results.  Purposely off x 3 months to see if still needs.  He does. Following home BPs which are running good.  No change needed Leg injury one month ago when ladder fell beneath him.  Right ant shin initially infected, healed and no breaking down again. Needs pneumovax Skin lesion on Left temple and Rt shoulder.  Review of Systems     Objective:   Physical Exam Lungs clear Cardiac RRR without m or g Rt leg ant shin skin breakdown and erythema,  No mass. No boney pain. Left temple and left shoulder irregular pigmented lesions which are probably pigmented seb Ks but enough worry for melanoma to biopsy.      Assessment & Plan:

## 2012-08-21 NOTE — Assessment & Plan Note (Signed)
Ten year risk of MI based on current #s is 13%.  Based on last year #s is 10% He will restart lipitor.

## 2012-08-21 NOTE — Assessment & Plan Note (Signed)
Well controled with diet and exercise.

## 2012-08-26 ENCOUNTER — Other Ambulatory Visit: Payer: Self-pay | Admitting: Dermatology

## 2012-08-26 DIAGNOSIS — L821 Other seborrheic keratosis: Secondary | ICD-10-CM | POA: Diagnosis not present

## 2012-08-26 DIAGNOSIS — D235 Other benign neoplasm of skin of trunk: Secondary | ICD-10-CM | POA: Diagnosis not present

## 2012-08-26 DIAGNOSIS — C44611 Basal cell carcinoma of skin of unspecified upper limb, including shoulder: Secondary | ICD-10-CM | POA: Diagnosis not present

## 2012-08-26 DIAGNOSIS — D485 Neoplasm of uncertain behavior of skin: Secondary | ICD-10-CM | POA: Diagnosis not present

## 2012-08-26 DIAGNOSIS — D1801 Hemangioma of skin and subcutaneous tissue: Secondary | ICD-10-CM | POA: Diagnosis not present

## 2013-05-03 DIAGNOSIS — Z23 Encounter for immunization: Secondary | ICD-10-CM | POA: Diagnosis not present

## 2013-05-27 ENCOUNTER — Other Ambulatory Visit: Payer: Self-pay

## 2013-06-15 ENCOUNTER — Other Ambulatory Visit: Payer: Self-pay | Admitting: Family Medicine

## 2013-06-15 DIAGNOSIS — N529 Male erectile dysfunction, unspecified: Secondary | ICD-10-CM

## 2013-06-16 NOTE — Assessment & Plan Note (Signed)
Refill per e request 

## 2013-07-26 ENCOUNTER — Other Ambulatory Visit: Payer: Self-pay | Admitting: Family Medicine

## 2013-07-26 DIAGNOSIS — D696 Thrombocytopenia, unspecified: Secondary | ICD-10-CM

## 2013-07-26 DIAGNOSIS — E7800 Pure hypercholesterolemia, unspecified: Secondary | ICD-10-CM

## 2013-07-26 DIAGNOSIS — I10 Essential (primary) hypertension: Secondary | ICD-10-CM

## 2013-07-26 HISTORY — DX: Thrombocytopenia, unspecified: D69.6

## 2013-07-27 DIAGNOSIS — D1801 Hemangioma of skin and subcutaneous tissue: Secondary | ICD-10-CM | POA: Diagnosis not present

## 2013-07-27 DIAGNOSIS — D485 Neoplasm of uncertain behavior of skin: Secondary | ICD-10-CM | POA: Diagnosis not present

## 2013-07-27 DIAGNOSIS — L821 Other seborrheic keratosis: Secondary | ICD-10-CM | POA: Diagnosis not present

## 2013-08-23 ENCOUNTER — Encounter: Payer: Self-pay | Admitting: Home Health Services

## 2013-08-23 ENCOUNTER — Other Ambulatory Visit: Payer: Medicare Other

## 2013-08-23 ENCOUNTER — Ambulatory Visit (INDEPENDENT_AMBULATORY_CARE_PROVIDER_SITE_OTHER): Payer: Medicare Other | Admitting: Home Health Services

## 2013-08-23 VITALS — Ht 77.0 in | Wt 195.0 lb

## 2013-08-23 DIAGNOSIS — I10 Essential (primary) hypertension: Secondary | ICD-10-CM

## 2013-08-23 DIAGNOSIS — D696 Thrombocytopenia, unspecified: Secondary | ICD-10-CM

## 2013-08-23 DIAGNOSIS — E7800 Pure hypercholesterolemia, unspecified: Secondary | ICD-10-CM | POA: Diagnosis not present

## 2013-08-23 DIAGNOSIS — Z0000 Encounter for general adult medical examination without abnormal findings: Secondary | ICD-10-CM | POA: Diagnosis not present

## 2013-08-23 LAB — COMPLETE METABOLIC PANEL WITH GFR
ALBUMIN: 4.1 g/dL (ref 3.5–5.2)
ALT: 17 U/L (ref 0–53)
AST: 34 U/L (ref 0–37)
Alkaline Phosphatase: 65 U/L (ref 39–117)
BUN: 13 mg/dL (ref 6–23)
CALCIUM: 9.2 mg/dL (ref 8.4–10.5)
CHLORIDE: 102 meq/L (ref 96–112)
CO2: 31 mEq/L (ref 19–32)
Creat: 1.03 mg/dL (ref 0.50–1.35)
GFR, Est African American: 87 mL/min
GFR, Est Non African American: 75 mL/min
Glucose, Bld: 100 mg/dL — ABNORMAL HIGH (ref 70–99)
POTASSIUM: 4.3 meq/L (ref 3.5–5.3)
SODIUM: 142 meq/L (ref 135–145)
Total Bilirubin: 1.2 mg/dL (ref 0.2–1.2)
Total Protein: 6.5 g/dL (ref 6.0–8.3)

## 2013-08-23 LAB — LIPID PANEL
Cholesterol: 149 mg/dL (ref 0–200)
HDL: 56 mg/dL (ref >39)
LDL CALC: 80 mg/dL (ref 0–99)
Total CHOL/HDL Ratio: 2.7 Ratio
Triglycerides: 64 mg/dL (ref <150)
VLDL: 13 mg/dL (ref 0–40)

## 2013-08-23 LAB — CBC
HCT: 43.8 % (ref 39.0–52.0)
Hemoglobin: 14.8 g/dL (ref 13.0–17.0)
MCH: 32 pg (ref 26.0–34.0)
MCHC: 33.8 g/dL (ref 30.0–36.0)
MCV: 94.8 fL (ref 78.0–100.0)
PLATELETS: 185 10*3/uL (ref 150–400)
RBC: 4.62 MIL/uL (ref 4.22–5.81)
RDW: 14 % (ref 11.5–15.5)
WBC: 5.2 10*3/uL (ref 4.0–10.5)

## 2013-08-23 NOTE — Progress Notes (Signed)
CMP,FLP AND CBC DONE TODAY Jennalyn Cawley 

## 2013-08-23 NOTE — Progress Notes (Signed)
Patient here for annual wellness visit, patient reports: Risk Factors/Conditions needing evaluation or treatment: Pt does not have any new risk factors that need evaluation. Home Safety: Pt lives with wife in 2 story home.  Pt reports having smoke detectors and does not have adaptive equipment in bathroom.  Other Information: Corrective lens: Pt wears daily corrective lens.  Has regular eye exams. Dentures: Pt does not have any dentures. Has regular dental exams. Memory: Pt denies any memory problems.  Patient's Mini Mental Score (recorded in doc. flowsheet): 30 Pt reports some problems with hearing but nothing unmanageable.    Balance/Gait: Pt does not have any noticeable impairment when walking.  Pt reports falling 1x this past year off a ladder with minimal injury.   Pt exercises 30-45 minutes daily.  Balance Abnormal Patient value  Sitting balance    Sit to stand    Attempts to arise    Immediate standing balance    Standing balance    Nudge    Eyes closed- Romberg    Tandem stance    Back lean    Neck Rotation    360 degree turn    Sitting down     Gait Abnormal Patient value  Initiation of gait    Step length-left    Step length-right    Step height-left    Step height-right    Step symmetry    Step continuity    Path deviation    Trunk movement    Walking stance        Annual Wellness Visit Requirements Recorded Today In  Medical, family, social history Past Medical, Family, Social History Section  Current providers Care team  Current medications Medications  Wt, BP, Ht, BMI Vital signs  Tobacco, alcohol, illicit drug use History  ADL Nurse Assessment  Depression Screening Nurse Assessment  Cognitive impairment Nurse Assessment  Mini Mental Status Document Flowsheet  Fall Risk Fall/Depression  Home Safety Progress Note  End of Life Planning (welcome visit) Social Documentation  Medicare preventative services Progress Note  Risk factors/conditions needing  evaluation/treatment Progress Note  Personalized health advice Patient Instructions, goals, letter  Diet & Exercise Social Documentation  Emergency Contact Social Documentation  Seat Belts Social Documentation  Sun exposure/protection Social Documentation

## 2013-08-23 NOTE — Progress Notes (Signed)
Patient ID: Alan Gonzalez, male   DOB: 04-19-1946, 68 y.o.   MRN: [id number] I have reviewed this visit and discussed with Alan Gonzalez and agree with her documentation

## 2013-08-26 ENCOUNTER — Encounter: Payer: Self-pay | Admitting: Family Medicine

## 2013-08-27 ENCOUNTER — Ambulatory Visit (INDEPENDENT_AMBULATORY_CARE_PROVIDER_SITE_OTHER): Payer: Medicare Other | Admitting: Family Medicine

## 2013-08-27 ENCOUNTER — Encounter: Payer: Self-pay | Admitting: Family Medicine

## 2013-08-27 VITALS — BP 124/77 | HR 61 | Wt 195.1 lb

## 2013-08-27 DIAGNOSIS — Z23 Encounter for immunization: Secondary | ICD-10-CM | POA: Diagnosis not present

## 2013-08-27 DIAGNOSIS — D696 Thrombocytopenia, unspecified: Secondary | ICD-10-CM | POA: Diagnosis not present

## 2013-08-27 DIAGNOSIS — N529 Male erectile dysfunction, unspecified: Secondary | ICD-10-CM | POA: Diagnosis not present

## 2013-08-27 DIAGNOSIS — L821 Other seborrheic keratosis: Secondary | ICD-10-CM

## 2013-08-27 DIAGNOSIS — E7800 Pure hypercholesterolemia, unspecified: Secondary | ICD-10-CM

## 2013-08-27 HISTORY — DX: Other seborrheic keratosis: L82.1

## 2013-08-27 MED ORDER — ATORVASTATIN CALCIUM 10 MG PO TABS: 10.0000 mg | ORAL_TABLET | Freq: Every day | ORAL | Status: DC

## 2013-08-27 NOTE — Assessment & Plan Note (Signed)
Stable on current meds 

## 2013-08-27 NOTE — Assessment & Plan Note (Signed)
Stable

## 2013-08-27 NOTE — Assessment & Plan Note (Signed)
Two frozen

## 2013-08-27 NOTE — Patient Instructions (Signed)
Great seeing you. Great work on the cholesterol. Let me know about the screening results. Also let me and derm know how you reacted to the freezing.

## 2013-08-27 NOTE — Progress Notes (Signed)
   Subjective:    Patient ID: Alan Gonzalez, male    DOB: December 27, 1945, 68 y.o.   MRN: [id number]  HPI FU high cholesterol.  Labs done before hand show great response to lipitor 10.  Continue.  He has notice pattern of mild increase in BS with lipitor.  No worry with FBS=100 and his body habitus.  Needs congugated pneumococcal vaccine. Asked about community screening flyer and I recommended AAA screening (family history) and bone density.  He will do and get me results.  Two pigmented seb Ks wants frozen      Review of Systems     Objective:   Physical Exam  Pigmented seb K on Left brow and left shoulder, both frozen.        Assessment & Plan:

## 2013-08-27 NOTE — Assessment & Plan Note (Signed)
Good response to Rx

## 2013-12-29 ENCOUNTER — Encounter: Payer: Self-pay | Admitting: Family Medicine

## 2013-12-29 DIAGNOSIS — Z0000 Encounter for general adult medical examination without abnormal findings: Secondary | ICD-10-CM | POA: Insufficient documentation

## 2013-12-29 NOTE — Progress Notes (Signed)
Patient ID: Alan Gonzalez, male   DOB: Feb 16, 1946, 68 y.o.   MRN: [id number] Documented outside screening labs under problem list

## 2014-01-18 ENCOUNTER — Other Ambulatory Visit: Payer: Self-pay | Admitting: Family Medicine

## 2014-01-28 DIAGNOSIS — H2510 Age-related nuclear cataract, unspecified eye: Secondary | ICD-10-CM | POA: Diagnosis not present

## 2014-01-28 DIAGNOSIS — H1045 Other chronic allergic conjunctivitis: Secondary | ICD-10-CM | POA: Diagnosis not present

## 2014-01-28 DIAGNOSIS — H43819 Vitreous degeneration, unspecified eye: Secondary | ICD-10-CM | POA: Diagnosis not present

## 2014-02-23 DIAGNOSIS — L0100 Impetigo, unspecified: Secondary | ICD-10-CM | POA: Diagnosis not present

## 2014-02-23 DIAGNOSIS — R21 Rash and other nonspecific skin eruption: Secondary | ICD-10-CM | POA: Diagnosis not present

## 2014-02-23 DIAGNOSIS — B029 Zoster without complications: Secondary | ICD-10-CM | POA: Diagnosis not present

## 2014-05-13 DIAGNOSIS — Z23 Encounter for immunization: Secondary | ICD-10-CM | POA: Diagnosis not present

## 2014-07-26 DIAGNOSIS — D225 Melanocytic nevi of trunk: Secondary | ICD-10-CM | POA: Diagnosis not present

## 2014-07-26 DIAGNOSIS — D485 Neoplasm of uncertain behavior of skin: Secondary | ICD-10-CM | POA: Diagnosis not present

## 2014-07-26 DIAGNOSIS — L570 Actinic keratosis: Secondary | ICD-10-CM | POA: Diagnosis not present

## 2014-07-26 DIAGNOSIS — L821 Other seborrheic keratosis: Secondary | ICD-10-CM | POA: Diagnosis not present

## 2014-07-26 DIAGNOSIS — D1801 Hemangioma of skin and subcutaneous tissue: Secondary | ICD-10-CM | POA: Diagnosis not present

## 2014-09-09 ENCOUNTER — Encounter: Payer: Self-pay | Admitting: Family Medicine

## 2014-09-09 ENCOUNTER — Ambulatory Visit (INDEPENDENT_AMBULATORY_CARE_PROVIDER_SITE_OTHER): Payer: Medicare Other | Admitting: Family Medicine

## 2014-09-09 VITALS — BP 142/81 | HR 54 | Temp 97.7°F | Ht 77.0 in | Wt 200.3 lb

## 2014-09-09 DIAGNOSIS — E7800 Pure hypercholesterolemia, unspecified: Secondary | ICD-10-CM

## 2014-09-09 DIAGNOSIS — K5732 Diverticulitis of large intestine without perforation or abscess without bleeding: Secondary | ICD-10-CM | POA: Diagnosis not present

## 2014-09-09 DIAGNOSIS — G609 Hereditary and idiopathic neuropathy, unspecified: Secondary | ICD-10-CM | POA: Diagnosis not present

## 2014-09-09 DIAGNOSIS — B001 Herpesviral vesicular dermatitis: Secondary | ICD-10-CM | POA: Insufficient documentation

## 2014-09-09 DIAGNOSIS — B009 Herpesviral infection, unspecified: Secondary | ICD-10-CM

## 2014-09-09 DIAGNOSIS — R739 Hyperglycemia, unspecified: Secondary | ICD-10-CM

## 2014-09-09 DIAGNOSIS — G629 Polyneuropathy, unspecified: Secondary | ICD-10-CM

## 2014-09-09 DIAGNOSIS — B0089 Other herpesviral infection: Secondary | ICD-10-CM

## 2014-09-09 DIAGNOSIS — R5383 Other fatigue: Secondary | ICD-10-CM | POA: Insufficient documentation

## 2014-09-09 DIAGNOSIS — E559 Vitamin D deficiency, unspecified: Secondary | ICD-10-CM | POA: Diagnosis not present

## 2014-09-09 HISTORY — DX: Hyperglycemia, unspecified: R73.9

## 2014-09-09 HISTORY — DX: Vitamin D deficiency, unspecified: E55.9

## 2014-09-09 HISTORY — DX: Polyneuropathy, unspecified: G62.9

## 2014-09-09 HISTORY — DX: Other herpesviral infection: B00.89

## 2014-09-09 LAB — CBC
HEMATOCRIT: 46.7 % (ref 39.0–52.0)
HEMOGLOBIN: 15.8 g/dL (ref 13.0–17.0)
MCH: 31.8 pg (ref 26.0–34.0)
MCHC: 33.8 g/dL (ref 30.0–36.0)
MCV: 94 fL (ref 78.0–100.0)
MPV: 11.3 fL (ref 8.6–12.4)
Platelets: 179 10*3/uL (ref 150–400)
RBC: 4.97 MIL/uL (ref 4.22–5.81)
RDW: 14.2 % (ref 11.5–15.5)
WBC: 5.6 10*3/uL (ref 4.0–10.5)

## 2014-09-09 LAB — COMPREHENSIVE METABOLIC PANEL
ALT: 14 U/L (ref 0–53)
AST: 20 U/L (ref 0–37)
Albumin: 4.4 g/dL (ref 3.5–5.2)
Alkaline Phosphatase: 67 U/L (ref 39–117)
BUN: 17 mg/dL (ref 6–23)
CHLORIDE: 107 meq/L (ref 96–112)
CO2: 30 mEq/L (ref 19–32)
Calcium: 9.7 mg/dL (ref 8.4–10.5)
Creat: 1.06 mg/dL (ref 0.50–1.35)
GLUCOSE: 101 mg/dL — AB (ref 70–99)
POTASSIUM: 5.5 meq/L — AB (ref 3.5–5.3)
Sodium: 142 mEq/L (ref 135–145)
TOTAL PROTEIN: 7.2 g/dL (ref 6.0–8.3)
Total Bilirubin: 0.7 mg/dL (ref 0.2–1.2)

## 2014-09-09 LAB — LIPID PANEL
CHOLESTEROL: 238 mg/dL — AB (ref 0–200)
HDL: 56 mg/dL (ref >39)
LDL Cholesterol: 165 mg/dL — ABNORMAL HIGH (ref 0–99)
TRIGLYCERIDES: 86 mg/dL (ref <150)
Total CHOL/HDL Ratio: 4.3 Ratio
VLDL: 17 mg/dL (ref 0–40)

## 2014-09-09 LAB — POCT GLYCOSYLATED HEMOGLOBIN (HGB A1C): HEMOGLOBIN A1C: 5.6

## 2014-09-09 MED ORDER — AMOXICILLIN-POT CLAVULANATE 875-125 MG PO TABS: 1.0000 | ORAL_TABLET | Freq: Two times a day (BID) | ORAL | Status: DC

## 2014-09-09 MED ORDER — ACYCLOVIR 800 MG PO TABS: 800.0000 mg | ORAL_TABLET | Freq: Two times a day (BID) | ORAL | Status: DC

## 2014-09-09 MED ORDER — ATORVASTATIN CALCIUM 10 MG PO TABS: 10.0000 mg | ORAL_TABLET | Freq: Every day | ORAL | Status: DC

## 2014-09-09 NOTE — Assessment & Plan Note (Addendum)
Hx of low vit d.  Needs recheck

## 2014-09-09 NOTE — Progress Notes (Signed)
   Subjective:    Patient ID: Alan Gonzalez, male    DOB: 31-Mar-1946, 69 y.o.   MRN: [id number]  HPI Here for recheck of multiple problems and a refill of medications.  Patient prefers written prescriptions. 1. Recurrent oral herpes simplex.  Well managed with acyclovir.  Needs refill 2. Occasional diverticulitis.  Manages with diet and intermitant antibiotics.  Needs refill.  One episode in the last 12 months. 3. New complaint of fatigue.  Does not stop him from being very active.  He just has a new, distinct feeling of fatigue.  No depressive symptoms.  No dyspnea, chest pain or bleeding.   4. Needs lipid panel.  Because he has lost weight and last lipid panel was great, we agreed to stop lipitor 2 months ago.  This FLP is off medications. 5. Sensation of mild numbness in both feet.      Review of Systems     Objective:   Physical ExamHeent normal, conjunctiva do not suggest anemia. Neck no thyromegally Lungs clear Cardiac RRR without m or g Abd benign Ext good pulses, no edema. Sensation grossly intact. No foot callous or skin breakdown.        Assessment & Plan:

## 2014-09-09 NOTE — Patient Instructions (Signed)
I will call with lab results. Sorry for the computer issues.   I doubt anything will come of these complaints.

## 2014-09-09 NOTE — Assessment & Plan Note (Signed)
Check off statin

## 2014-09-09 NOTE — Assessment & Plan Note (Signed)
Elevated on previous labs

## 2014-09-09 NOTE — Assessment & Plan Note (Signed)
Nothing focal.  Will check screening labs.

## 2014-09-09 NOTE — Assessment & Plan Note (Signed)
Bilateral nature suggest neuropathy rather than radiculopathy.  Screening labs.

## 2014-09-10 LAB — VITAMIN B12: Vitamin B-12: 266 pg/mL (ref 211–911)

## 2014-09-10 LAB — VITAMIN D 25 HYDROXY (VIT D DEFICIENCY, FRACTURES): VIT D 25 HYDROXY: 20 ng/mL — AB (ref 30–100)

## 2014-09-10 LAB — TSH: TSH: 1.883 u[IU]/mL (ref 0.350–4.500)

## 2014-09-12 ENCOUNTER — Encounter: Payer: Self-pay | Admitting: Family Medicine

## 2014-09-21 ENCOUNTER — Other Ambulatory Visit: Payer: Self-pay | Admitting: Radiology

## 2015-03-13 ENCOUNTER — Encounter: Payer: Self-pay | Admitting: Family Medicine

## 2015-03-13 ENCOUNTER — Ambulatory Visit
Admission: RE | Admit: 2015-03-13 | Discharge: 2015-03-13 | Disposition: A | Discharge status: Home / Self Care | Payer: Medicare Other | Source: Ambulatory Visit | Attending: Family Medicine | Admitting: Family Medicine

## 2015-03-13 ENCOUNTER — Ambulatory Visit (INDEPENDENT_AMBULATORY_CARE_PROVIDER_SITE_OTHER): Payer: Medicare Other | Admitting: Family Medicine

## 2015-03-13 VITALS — BP 148/80 | HR 60 | Resp 12

## 2015-03-13 DIAGNOSIS — N200 Calculus of kidney: Secondary | ICD-10-CM

## 2015-03-13 DIAGNOSIS — R109 Unspecified abdominal pain: Secondary | ICD-10-CM

## 2015-03-13 DIAGNOSIS — K5900 Constipation, unspecified: Secondary | ICD-10-CM | POA: Diagnosis not present

## 2015-03-13 DIAGNOSIS — R1011 Right upper quadrant pain: Secondary | ICD-10-CM | POA: Diagnosis not present

## 2015-03-13 HISTORY — DX: Calculus of kidney: N20.0

## 2015-03-13 LAB — POCT URINALYSIS DIPSTICK
Bilirubin, UA: NEGATIVE
Glucose, UA: NEGATIVE
Nitrite, UA: NEGATIVE
PH UA: 6.5
PROTEIN UA: 30
SPEC GRAV UA: 1.02
Urobilinogen, UA: 0.2

## 2015-03-13 LAB — POCT UA - MICROSCOPIC ONLY

## 2015-03-13 MED ORDER — HYDROCODONE-ACETAMINOPHEN 5-325 MG PO TABS: 1.0000 | ORAL_TABLET | Freq: Four times a day (QID) | ORAL | Status: DC | PRN

## 2015-03-13 MED ORDER — TAMSULOSIN HCL 0.4 MG PO CAPS: 0.4000 mg | ORAL_CAPSULE | Freq: Every day | ORAL | Status: DC

## 2015-03-13 NOTE — Progress Notes (Signed)
   Subjective:    Patient ID: Alan Gonzalez, male    DOB: Dec 01, 1945, 69 y.o.   MRN: [id number]  HPI Awoke last night with right flank pain radiating to rt lower quadrent and groin.  Some frequency.  No dysuria.  No fever chills, or vomiting.  Pain has come and gone.  Severe 8/10 at its worst.  No previous kidney stone.  Did have brown urine.  Does have hx of diverticulitis.  This is a different pain character and location.   Fine until onset of pain.     Review of Systems     Objective:   Physical Exam VS noted Lungs clear Abd no CVA tenderness Some mild right mid to lower quadrant tenderness.          Assessment & Plan:

## 2015-03-13 NOTE — Assessment & Plan Note (Signed)
Start with abd flat plate to see if I can confirm kidney stone.  Seems highly likely with classic history and bloody urine.  Rx vicodan and flomax.  Strain urine.

## 2015-03-13 NOTE — Patient Instructions (Signed)
Get your x ray - I will call with results.  We can always follow up with the CT scan if still questions. Call me immediately if fever or chills.   The flomax is to help the ureter muscles relax.  Strain your urine.  Bring in stone if you catch it.   Drink lots of fluids.

## 2015-04-04 ENCOUNTER — Other Ambulatory Visit: Payer: Medicare Other

## 2015-04-04 ENCOUNTER — Other Ambulatory Visit: Payer: Self-pay | Admitting: Family Medicine

## 2015-04-04 DIAGNOSIS — N200 Calculus of kidney: Secondary | ICD-10-CM | POA: Diagnosis not present

## 2015-04-04 NOTE — Assessment & Plan Note (Signed)
Patient has collected several small "salt crystal" sized particles by straining urine.  He will bring in for stone analysis.  Order entered.

## 2015-04-04 NOTE — Progress Notes (Signed)
Stone analysis done today Warden/ranger

## 2015-04-07 LAB — STONE ANALYSIS: STONE WEIGHT KSTONE: 0.001 g

## 2015-04-10 ENCOUNTER — Telehealth: Payer: Self-pay | Admitting: Family Medicine

## 2015-04-10 DIAGNOSIS — N200 Calculus of kidney: Secondary | ICD-10-CM

## 2015-04-10 NOTE — Assessment & Plan Note (Signed)
Informed patient.  He will stay hydrated and get serum uric acid level drawn.

## 2015-04-10 NOTE — Telephone Encounter (Signed)
Stone analysis shows uric acid kidney stone.

## 2015-04-12 ENCOUNTER — Other Ambulatory Visit: Payer: Medicare Other

## 2015-04-12 DIAGNOSIS — N200 Calculus of kidney: Secondary | ICD-10-CM

## 2015-04-12 DIAGNOSIS — M1008 Idiopathic gout, vertebrae: Secondary | ICD-10-CM | POA: Diagnosis not present

## 2015-04-12 LAB — URIC ACID: URIC ACID, SERUM: 6.2 mg/dL (ref 4.0–7.8)

## 2015-04-12 NOTE — Progress Notes (Signed)
Uric acid done today marci holder 

## 2015-04-13 NOTE — Progress Notes (Signed)
Patient ID: Alan Gonzalez, male   DOB: 29-Sep-1945, 69 y.o.   MRN: [id number] Called and left message that uric acid was normal.  Options are to wait, this was his first kidney stone, or to treat with potassium citrate.  He will call or e mail back to discuss further.

## 2015-05-03 ENCOUNTER — Ambulatory Visit (HOSPITAL_COMMUNITY)
Admission: RE | Admit: 2015-05-03 | Discharge: 2015-05-03 | Disposition: A | Discharge status: Home / Self Care | Payer: Medicare Other | Source: Ambulatory Visit | Attending: Family Medicine | Admitting: Family Medicine

## 2015-05-03 ENCOUNTER — Ambulatory Visit (INDEPENDENT_AMBULATORY_CARE_PROVIDER_SITE_OTHER): Payer: Medicare Other | Admitting: Family Medicine

## 2015-05-03 VITALS — BP 150/88 | HR 57 | Temp 97.0°F | Ht 77.0 in | Wt 208.2 lb

## 2015-05-03 DIAGNOSIS — E7800 Pure hypercholesterolemia, unspecified: Secondary | ICD-10-CM | POA: Diagnosis not present

## 2015-05-03 DIAGNOSIS — R739 Hyperglycemia, unspecified: Secondary | ICD-10-CM | POA: Diagnosis not present

## 2015-05-03 DIAGNOSIS — R001 Bradycardia, unspecified: Secondary | ICD-10-CM | POA: Insufficient documentation

## 2015-05-03 DIAGNOSIS — R0609 Other forms of dyspnea: Secondary | ICD-10-CM | POA: Diagnosis not present

## 2015-05-03 DIAGNOSIS — R61 Generalized hyperhidrosis: Secondary | ICD-10-CM | POA: Diagnosis not present

## 2015-05-03 DIAGNOSIS — R9431 Abnormal electrocardiogram [ECG] [EKG]: Secondary | ICD-10-CM | POA: Insufficient documentation

## 2015-05-03 DIAGNOSIS — Z23 Encounter for immunization: Secondary | ICD-10-CM | POA: Diagnosis not present

## 2015-05-03 DIAGNOSIS — R0600 Dyspnea, unspecified: Secondary | ICD-10-CM | POA: Insufficient documentation

## 2015-05-03 DIAGNOSIS — Z1159 Encounter for screening for other viral diseases: Secondary | ICD-10-CM | POA: Diagnosis not present

## 2015-05-03 LAB — HEPATITIS C ANTIBODY: HCV Ab: NEGATIVE

## 2015-05-03 LAB — LDL CHOLESTEROL, DIRECT: LDL DIRECT: 105 mg/dL (ref <130)

## 2015-05-03 NOTE — Patient Instructions (Signed)
I will be in touch. Likely cardiology referral See if you can buy or borrow a blood sugar meter and test when you are feeling bad.

## 2015-05-03 NOTE — Assessment & Plan Note (Addendum)
Needs direct LDL now that back on statin.  Fine as expected.

## 2015-05-04 ENCOUNTER — Encounter: Payer: Self-pay | Admitting: Family Medicine

## 2015-05-04 NOTE — Assessment & Plan Note (Signed)
Only with spells.  While his symptoms all fit nicely with hypoglycemia, I am worried that I am missing something cardiac.  Will check echo.  Likely follow with cards referral and stress testing.

## 2015-05-04 NOTE — Progress Notes (Signed)
   Subjective:    Patient ID: Alan Gonzalez, male    DOB: [birth year], 69 y.o.   MRN: [id number]  HPI Follow up spells.  I have the working diagnosis of hypoglycemia.  Has spells of lightheadedness, generalized weakness, sweating and clamminess.  If he has during activity, he must stop because of weakness.  Usually relieved by eating something.  Denies tachycardia, palpitations or chest pain.  These spells are more likely to occur with activity but some have occurred at rest.  He also has some sense of SOB during these spells.   Risk factors for CAD are hypercholesterolemia and family history.  Has not been on ASA due to easy bruising.  Very active and fit.  The spells are not predictable.  Often can do max exercise without problems.    Review of Systems     Objective:   Physical ExamLungs clear  Cardiac RRR without m or g Ext normal pulses, no edema        Assessment & Plan:

## 2015-05-04 NOTE — Assessment & Plan Note (Signed)
I would like to confirm if possible.  He will try to get a blood sugar meter and test when spell happens.

## 2015-05-10 ENCOUNTER — Other Ambulatory Visit: Payer: Self-pay

## 2015-05-10 ENCOUNTER — Other Ambulatory Visit: Payer: Self-pay | Admitting: Family Medicine

## 2015-05-10 ENCOUNTER — Ambulatory Visit (HOSPITAL_COMMUNITY): Payer: Medicare Other | Attending: Cardiology

## 2015-05-10 DIAGNOSIS — I340 Nonrheumatic mitral (valve) insufficiency: Secondary | ICD-10-CM | POA: Insufficient documentation

## 2015-05-10 DIAGNOSIS — R0609 Other forms of dyspnea: Secondary | ICD-10-CM

## 2015-05-10 DIAGNOSIS — R0600 Dyspnea, unspecified: Secondary | ICD-10-CM | POA: Diagnosis not present

## 2015-05-10 DIAGNOSIS — I351 Nonrheumatic aortic (valve) insufficiency: Secondary | ICD-10-CM | POA: Diagnosis not present

## 2015-05-10 NOTE — Assessment & Plan Note (Signed)
Echo good.  Still feels bad, generally weak.

## 2015-05-10 NOTE — Assessment & Plan Note (Signed)
Echo normal.  Still feels bad.  Will proceed with cards referral and likely stress test.

## 2015-05-16 ENCOUNTER — Encounter: Payer: Self-pay | Admitting: Interventional Cardiology

## 2015-05-16 ENCOUNTER — Ambulatory Visit (INDEPENDENT_AMBULATORY_CARE_PROVIDER_SITE_OTHER): Payer: Medicare Other | Admitting: Interventional Cardiology

## 2015-05-16 ENCOUNTER — Encounter (HOSPITAL_COMMUNITY): Payer: Self-pay | Admitting: *Deleted

## 2015-05-16 ENCOUNTER — Encounter: Payer: Self-pay | Admitting: *Deleted

## 2015-05-16 VITALS — BP 150/86 | HR 55 | Ht 77.0 in | Wt 209.4 lb

## 2015-05-16 DIAGNOSIS — R9431 Abnormal electrocardiogram [ECG] [EKG]: Secondary | ICD-10-CM | POA: Diagnosis not present

## 2015-05-16 DIAGNOSIS — I10 Essential (primary) hypertension: Secondary | ICD-10-CM

## 2015-05-16 DIAGNOSIS — I5189 Other ill-defined heart diseases: Secondary | ICD-10-CM

## 2015-05-16 DIAGNOSIS — R0609 Other forms of dyspnea: Secondary | ICD-10-CM | POA: Diagnosis not present

## 2015-05-16 DIAGNOSIS — E7800 Pure hypercholesterolemia, unspecified: Secondary | ICD-10-CM | POA: Diagnosis not present

## 2015-05-16 DIAGNOSIS — I519 Heart disease, unspecified: Secondary | ICD-10-CM

## 2015-05-16 DIAGNOSIS — R0600 Dyspnea, unspecified: Secondary | ICD-10-CM

## 2015-05-16 HISTORY — DX: Other ill-defined heart diseases: I51.89

## 2015-05-16 NOTE — Progress Notes (Signed)
Cardiology Office Note   Date:  05/16/2015   ID:  Alan Gonzalez, DOB Jan 02, 1946, MRN [id number]  PCP:  Zigmund Gottron, MD  Cardiologist:  Sinclair Grooms, MD   Chief Complaint  Patient presents with  . Shortness of Breath      History of Present Illness: Alan Gonzalez is a 69 y.o. male who presents for evaluation of exercise intolerance and exertional dyspnea.  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 He engages in cardio and isometric activities. Over the past 2-4 months he has experienced intolerance to his exercise protocol both aerobic and anaerobic. Exercise tolerance is decreased. There is some exertional dyspnea. He denies chest discomfort. He has not noted lower extremity swelling or orthopnea. Palpitations have not been a component of his complaint. He has never had syncope.  He has had some diaphoresis and lightheadedness.    Past Medical History  Diagnosis Date  . Hyperlipidemia   . Hypertension   . Diverticulitis     Past Surgical History  Procedure Laterality Date  . Spine surgery       Current Outpatient Prescriptions  Medication Sig Dispense Refill  . acyclovir (ZOVIRAX) 800 MG tablet Take 1 tablet (800 mg total) by mouth 2 (two) times daily. 20 tablet 12  . amoxicillin-clavulanate (AUGMENTIN) 875-125 MG per tablet Take 1 tablet by mouth 2 (two) times daily. 30 tablet 3  . atorvastatin (LIPITOR) 10 MG tablet Take 1 tablet (10 mg total) by mouth at bedtime. 90 tablet 3  . tamsulosin (FLOMAX) 0.4 MG CAPS capsule Take 1 capsule (0.4 mg total) by mouth daily. 30 capsule 0   No current facility-administered medications for this visit.    Allergies:   Review of patient's allergies indicates no known allergies.    Social History:  The patient  reports that he has never smoked. He has never used smokeless tobacco. He reports that he drinks about 3.5 oz of alcohol per week. He  reports that he does not use illicit drugs.   Family History:  The patient's family history includes COPD in his father; Gonzalez in his mother and sister; Heart disease in his brother.    ROS:  Please see the history of present illness.   Otherwise, review of systems are positive for nocturnal headaches that awaken him from sleep, easy bruising, lightheadedness and dizziness without syncope, some vision disturbance, and snoring. He awakens twice each night. When he awakens with headache, his blood pressure is always elevated. He monitors his blood pressure routinely and has noted blood pressures generally less than 979 mmHg systolic.   All other systems are reviewed and negative.    PHYSICAL EXAM: VS:  BP 150/86 mmHg  Pulse 55  Ht 6\' 5"  (1.956 m)  Wt 94.983 kg (209 lb 6.4 oz)  BMI 24.83 kg/m2  SpO2 98% , BMI Body mass index is 24.83 kg/(m^2). GEN: Well nourished, well developed, in no acute distress HEENT: normal Neck: no JVD, carotid bruits, or masses Cardiac: RRR.  There is no murmur, rub, or gallop. There is no edema. Respiratory:  clear to auscultation bilaterally, normal work of breathing. GI: soft, nontender, nondistended, + BS MS: no deformity or atrophy Skin: warm and dry, no rash Neuro:  Strength and sensation are intact Psych: euthymic mood, full affect   EKG:  EKG is not ordered today. The ekg on 05/03/15 demonstrates sinus bradycardia, incomplete right bundle branch block, poor R wave progression  V1 through V4 compatible with prior anterior infarction (versus malposition of precordial leads)   Recent Labs: 09/09/2014: ALT 14; BUN 17; Creat 1.06; Hemoglobin 15.8; Platelets 179; Potassium 5.5*; Sodium 142; TSH 1.883    Lipid Panel    Component Value Date/Time   CHOL 238* 09/09/2014 0940   TRIG 86 09/09/2014 0940   HDL 56 09/09/2014 0940   CHOLHDL 4.3 09/09/2014 0940   VLDL 17 09/09/2014 0940   LDLCALC 165* 09/09/2014 0940   LDLDIRECT 105 05/03/2015 1054      Wt  Readings from Last 3 Encounters:  05/16/15 94.983 kg (209 lb 6.4 oz)  05/03/15 94.439 kg (208 lb 3.2 oz)  09/09/14 90.855 kg (200 lb 4.8 oz)      Other studies Reviewed: Additional studies/ records that were reviewed today include: primary physician office notes.. The findings include suspicion of hypoglycemia per Dr. Andria Frames..  Echocardiogram: 05/10/15 Study Conclusions  - Left ventricle: The cavity size was normal. Wall thickness was normal. Systolic function was normal. The estimated ejection fraction was in the range of 55% to 60%. Wall motion was normal; there were no regional wall motion abnormalities. Doppler parameters are consistent with abnormal left ventricular relaxation (grade 1 diastolic dysfunction). - Aortic valve: There was no stenosis. There was trivial regurgitation. - Mitral valve: There was trivial regurgitation. - Right ventricle: The cavity size was normal. Systolic function was normal. - Tricuspid valve: Peak RV-RA gradient (S): 12 mm Hg. - Pulmonary arteries: PA peak pressure: 15 mm Hg (S). - Inferior vena cava: The vessel was normal in size. The respirophasic diameter changes were in the normal range (>= 50%), consistent with normal central venous pressure.   ASSESSMENT AND PLAN:  1. Exertional dyspnea Rule out myocardial ischemia especially given the EKG appearance.  2. Abnormal EKG with poor R-wave progression, likely lead position Poor R-wave progression V1 through V4 and incomplete right bundle branch block. High-risk false positive electrocardiographic response.  3. Hypertension essential. Exercise treadmill testing will help evaluate level of blood pressure control.  4. Hyperlipidemia Currently on statin therapy  5. Diastolic dysfunction without evidence of volume overload or true diastolic heart failure  Current medicines are reviewed at length with the patient today.  The patient has the following concerns regarding  medicines: none.  The following changes/actions have been instituted:    Stress Cardiolite  Follow-up will be pending findings.  Labs/ tests ordered today include:   Orders Placed This Encounter  Procedures  . Myocardial Perfusion Imaging     Disposition:   FU with HS in PRN    Signed, Sinclair Grooms, MD  05/16/2015 1:15 PM    [facility] [address], [place], [place]  [address] Phone: [phone]; Fax: [phone]

## 2015-05-16 NOTE — Patient Instructions (Signed)
Medication Instructions:  No changes.  Labwork: None today  Testing/Procedures: Your physician has requested that you have en exercise stress myoview. For further information please visit [email or web address]. Please follow instruction sheet, as given.    Follow-Up: You do not need to schedule a follow up appointment with Dr Tamala Julian.        If you need a refill on your cardiac medications before your next appointment, please call your pharmacy.

## 2015-05-16 NOTE — Progress Notes (Signed)
Patient given detailed instructions per Myocardial Perfusion Study Information Sheet for the test on 05/18/15 at 0730. Patient notified to arrive 15 minutes early and that it is imperative to arrive on time for appointment to keep from having the test rescheduled.  If you need to cancel or reschedule your appointment, please call the office within 24 hours of your appointment. Failure to do so may result in a cancellation of your appointment, and a $50 no show fee. Patient verbalized understanding.Ysabela Keisler, Ranae Palms

## 2015-05-18 ENCOUNTER — Ambulatory Visit (HOSPITAL_COMMUNITY): Payer: Medicare Other | Attending: Cardiovascular Disease

## 2015-05-18 DIAGNOSIS — R9431 Abnormal electrocardiogram [ECG] [EKG]: Secondary | ICD-10-CM | POA: Insufficient documentation

## 2015-05-18 DIAGNOSIS — R5383 Other fatigue: Secondary | ICD-10-CM | POA: Insufficient documentation

## 2015-05-18 DIAGNOSIS — R42 Dizziness and giddiness: Secondary | ICD-10-CM | POA: Insufficient documentation

## 2015-05-18 DIAGNOSIS — R9439 Abnormal result of other cardiovascular function study: Secondary | ICD-10-CM | POA: Insufficient documentation

## 2015-05-18 DIAGNOSIS — E7800 Pure hypercholesterolemia, unspecified: Secondary | ICD-10-CM | POA: Diagnosis not present

## 2015-05-18 DIAGNOSIS — R0609 Other forms of dyspnea: Secondary | ICD-10-CM | POA: Insufficient documentation

## 2015-05-18 DIAGNOSIS — I10 Essential (primary) hypertension: Secondary | ICD-10-CM | POA: Diagnosis not present

## 2015-05-18 DIAGNOSIS — R0600 Dyspnea, unspecified: Secondary | ICD-10-CM

## 2015-05-18 MED ORDER — TECHNETIUM TC 99M SESTAMIBI GENERIC - CARDIOLITE
31.1000 | Freq: Once | INTRAVENOUS | Status: AC | PRN
  Administered 2015-05-18: 31.1 via INTRAVENOUS

## 2015-05-18 MED ORDER — TECHNETIUM TC 99M SESTAMIBI GENERIC - CARDIOLITE
10.3000 | Freq: Once | INTRAVENOUS | Status: AC | PRN
  Administered 2015-05-18: 10 via INTRAVENOUS

## 2015-05-19 LAB — MYOCARDIAL PERFUSION IMAGING
CHL CUP MPHR: 152 {beats}/min
CHL CUP NUCLEAR SDS: 0
CHL CUP RESTING HR STRESS: 51 {beats}/min
CHL CUP STRESS STAGE 1 DBP: 88 mmHg
CHL CUP STRESS STAGE 1 SBP: 150 mmHg
CHL CUP STRESS STAGE 2 SPEED: 0 mph
CHL CUP STRESS STAGE 3 GRADE: 10 %
CHL CUP STRESS STAGE 3 HR: 76 {beats}/min
CHL CUP STRESS STAGE 4 DBP: 84 mmHg
CHL CUP STRESS STAGE 4 SPEED: 2.5 mph
CHL CUP STRESS STAGE 6 SPEED: 4.2 mph
CHL CUP STRESS STAGE 7 HR: 131 {beats}/min
CHL CUP STRESS STAGE 7 SPEED: 0 mph
CSEPEW: 13.4 METS
CSEPPBP: 202 mmHg
CSEPPHR: 162 {beats}/min
CSEPPMHR: 106 %
Exercise duration (min): 12 min
Exercise duration (sec): 0 s
LHR: 0.31
LV dias vol: 141 mL
LV sys vol: 75 mL
Percent HR: 106 %
RPE: 16
SRS: 3
SSS: 3
Stage 1 Grade: 0 %
Stage 1 HR: 61 {beats}/min
Stage 1 Speed: 0 mph
Stage 2 Grade: 0 %
Stage 2 HR: 61 {beats}/min
Stage 3 Speed: 1.7 mph
Stage 4 Grade: 12 %
Stage 4 HR: 96 {beats}/min
Stage 4 SBP: 196 mmHg
Stage 5 DBP: 86 mmHg
Stage 5 Grade: 14 %
Stage 5 HR: 130 {beats}/min
Stage 5 SBP: 209 mmHg
Stage 5 Speed: 3.4 mph
Stage 6 DBP: 87 mmHg
Stage 6 Grade: 16 %
Stage 6 HR: 162 {beats}/min
Stage 6 SBP: 202 mmHg
Stage 7 DBP: 86 mmHg
Stage 7 Grade: 0 %
Stage 7 SBP: 192 mmHg
Stage 8 Grade: 0 %
Stage 8 Speed: 0 mph
TID: 1.02

## 2015-05-29 ENCOUNTER — Telehealth: Payer: Self-pay

## 2015-05-29 NOTE — Telephone Encounter (Signed)
-----   Message from Belva Crome, MD sent at 05/26/2015  2:16 PM EDT ----- EF 47% but this is related to modality not being a good measure of LV function . We already know the heart is strong by echo (more reliable). No blood flow abnormality noted, therefore, reassured that heart is doing well.

## 2015-05-29 NOTE — Telephone Encounter (Signed)
Called to give pt myoview results.lmtcb 

## 2015-05-30 NOTE — Telephone Encounter (Signed)
F/u  Pt returning phone call- will be available until 3:30pm; Please call back and discuss.

## 2015-05-30 NOTE — Telephone Encounter (Signed)
Returned pt call. Pt aware of myoview results. EF 47% but this is related to modality not being a good measure of LV function . We already know the heart is strong by echo (more reliable).No blood flow abnormality noted, therefore, reassured that heart is doing well. Pt sts that he runs on the treadmill 3-4 a week, his heartrate goes up ton the 200bpms during exercise.pt recovery time in normal, he would like Dr.Smith's opinion on whether or not he should push his self that hard. Adv him I will fwd the question to Dr.Smith

## 2015-06-01 ENCOUNTER — Encounter: Payer: Self-pay | Admitting: Interventional Cardiology

## 2015-06-02 NOTE — Telephone Encounter (Signed)
Avoid activities that increase the heart rate greater than 160 bpm

## 2015-06-05 NOTE — Telephone Encounter (Signed)
Dr.Smith's response sent via my chart, as a f/u response to pt question

## 2015-06-06 ENCOUNTER — Telehealth: Payer: Self-pay

## 2015-06-06 MED ORDER — AMLODIPINE BESYLATE 5 MG PO TABS: 5.0000 mg | ORAL_TABLET | Freq: Every day | ORAL | Status: DC

## 2015-06-06 NOTE — Telephone Encounter (Signed)
Pt aware of Dr.Smith's instructions that were sent via My Chart Please send to his pharmacy amlodipine 5 mg daily.  Needs to monitor BP at home.  Needs to monitor and report how he is doing after 1-2 weeks.  F/u with me or extender in 1 month.  Rx sent to pt pharmacy [facility]. F/u appt scheduled with Dr.Smith for 12/16 8:45am Pt will bring bp readings with him to  his upcoming appt

## 2015-07-07 ENCOUNTER — Ambulatory Visit (INDEPENDENT_AMBULATORY_CARE_PROVIDER_SITE_OTHER): Payer: Medicare Other | Admitting: Interventional Cardiology

## 2015-07-07 ENCOUNTER — Encounter: Payer: Self-pay | Admitting: Interventional Cardiology

## 2015-07-07 VITALS — BP 138/82 | HR 55 | Ht 77.0 in | Wt 209.4 lb

## 2015-07-07 DIAGNOSIS — R9431 Abnormal electrocardiogram [ECG] [EKG]: Secondary | ICD-10-CM

## 2015-07-07 DIAGNOSIS — R0609 Other forms of dyspnea: Secondary | ICD-10-CM

## 2015-07-07 DIAGNOSIS — E7800 Pure hypercholesterolemia, unspecified: Secondary | ICD-10-CM

## 2015-07-07 DIAGNOSIS — I10 Essential (primary) hypertension: Secondary | ICD-10-CM | POA: Diagnosis not present

## 2015-07-07 DIAGNOSIS — R0600 Dyspnea, unspecified: Secondary | ICD-10-CM

## 2015-07-07 DIAGNOSIS — I519 Heart disease, unspecified: Secondary | ICD-10-CM

## 2015-07-07 NOTE — Patient Instructions (Addendum)
Medication Instructions:  Your physician has recommended you make the following change in your medication:  1.  STOP the Amlodipine  Labwork: NONE ORDERED  Testing/Procedures: NONE ORDERED  Follow-Up: Your physician wants you to follow-up in: AS NEEDED   Any Other Special Instructions Will Be Listed Below (If Applicable).   If you need a refill on your cardiac medications before your next appointment, please call your pharmacy.

## 2015-07-07 NOTE — Progress Notes (Signed)
Cardiology Office Note   Date:  07/07/2015   ID:  Alan Gonzalez, DOB 04/17/1946, MRN [id number]  PCP:  Zigmund Gottron, MD  Cardiologist:  Sinclair Grooms, MD   Chief Complaint  Patient presents with  . Shortness of Breath      History of Present Illness: Alan Gonzalez is a 69 y.o. male who presents for fatigue and exertional dyspnea  Alan Gonzalez is been evaluated and has normal LV function by echo without structural abnormality. He has a negative myocardial perfusion study without evidence of ischemia. He had have mild hypertensive blood pressure response with exercise and also preexercise. Because of this amlodipine 5 mg per day was started. He brought in convincing data premedication and post medication that his blood pressures are basically the same and essentially normal. He has no side effects on amlodipine.    Past Medical History  Diagnosis Date  . Hyperlipidemia   . Hypertension   . Diverticulitis     Past Surgical History  Procedure Laterality Date  . Spine surgery       Current Outpatient Prescriptions  Medication Sig Dispense Refill  . acyclovir (ZOVIRAX) 800 MG tablet Take 800 mg by mouth 2 (two) times daily as needed (MOUTH ULCERS).    Marland Kitchen amLODipine (NORVASC) 5 MG tablet Take 1 tablet (5 mg total) by mouth daily. 30 tablet 11  . amoxicillin-clavulanate (AUGMENTIN) 875-125 MG tablet Take 1 tablet by mouth 2 (two) times daily as needed (Diverticulitis).    Marland Kitchen atorvastatin (LIPITOR) 10 MG tablet Take 1 tablet (10 mg total) by mouth at bedtime. 90 tablet 3   No current facility-administered medications for this visit.    Allergies:   Review of patient's allergies indicates no known allergies.    Social History:  The patient  reports that he has never smoked. He has never used smokeless tobacco. He reports that he drinks about 3.5 oz of alcohol per week. He reports that he does not use illicit drugs.   Family History:  The patient's family history  includes COPD in his father; Cancer in his mother and sister; Heart disease in his brother.    ROS:  Please see the history of present illness.   Otherwise, review of systems are positive for none.   All other systems are reviewed and negative.    PHYSICAL EXAM: VS:  BP 138/82 mmHg  Pulse 55  Ht 6\' 5"  (1.956 m)  Wt 209 lb 6.4 oz (94.983 kg)  BMI 24.83 kg/m2  SpO2 98% , BMI Body mass index is 24.83 kg/(m^2). GEN: Well nourished, well developed, in no acute distress HEENT: normal Neck: no JVD, carotid bruits, or masses Cardiac: RRR.  There is no murmur, rub, or gallop. There is no edema. Respiratory:  clear to auscultation bilaterally, normal work of breathing. GI: soft, nontender, nondistended, + BS MS: no deformity or atrophy Skin: warm and dry, no rash Neuro:  Strength and sensation are intact Psych: euthymic mood, full affect   EKG:  EKG is not ordered today.    Recent Labs: 09/09/2014: ALT 14; BUN 17; Creat 1.06; Hemoglobin 15.8; Platelets 179; Potassium 5.5*; Sodium 142; TSH 1.883    Lipid Panel    Component Value Date/Time   CHOL 238* 09/09/2014 0940   TRIG 86 09/09/2014 0940   HDL 56 09/09/2014 0940   CHOLHDL 4.3 09/09/2014 0940   VLDL 17 09/09/2014 0940   LDLCALC 165* 09/09/2014 0940   LDLDIRECT 105 05/03/2015 1054  Wt Readings from Last 3 Encounters:  07/07/15 209 lb 6.4 oz (94.983 kg)  05/18/15 209 lb (94.802 kg)  05/16/15 209 lb 6.4 oz (94.983 kg)      Other studies Reviewed: Additional studies/ records that were reviewed today include: Blood pressure data recorded by the patient. The findings include blood pressures on amlodipine and off amlodipine on normal.    ASSESSMENT AND PLAN:  1. Left ventricular diastolic dysfunction Possibly accounting for some exercise induced dyspnea  2. Essential hypertension Excellent control  3. HYPERCHOLESTEROLEMIA Followed by primary care  4. Abnormal EKG No structural or blood flow abnormalities have  been identified by extensive evaluation  5. Exertional dyspnea Exercise induced dyspnea is not relieved by calcium channel blocker therapy. I will therefore discontinue amlodipine and place no restrictions on the patient.    Current medicines are reviewed at length with the patient today.  The patient has the following concerns regarding medicines: Wonders if he needs to stay on any therapy.  The following changes/actions have been instituted:    Discontinue amlodipine  Cardiology follow-up not required  Labs/ tests ordered today include:  No orders of the defined types were placed in this encounter.     Disposition:   FU with HS in PRN  Signed, Sinclair Grooms, MD  07/07/2015 9:15 AM    [facility] [place], [place], [place]  [address] Phone: [phone]; Fax: [phone]

## 2015-08-16 DIAGNOSIS — D485 Neoplasm of uncertain behavior of skin: Secondary | ICD-10-CM | POA: Diagnosis not present

## 2015-08-16 DIAGNOSIS — L723 Sebaceous cyst: Secondary | ICD-10-CM | POA: Diagnosis not present

## 2015-08-16 DIAGNOSIS — C44319 Basal cell carcinoma of skin of other parts of face: Secondary | ICD-10-CM | POA: Diagnosis not present

## 2015-08-16 DIAGNOSIS — L821 Other seborrheic keratosis: Secondary | ICD-10-CM | POA: Diagnosis not present

## 2015-08-16 DIAGNOSIS — D1801 Hemangioma of skin and subcutaneous tissue: Secondary | ICD-10-CM | POA: Diagnosis not present

## 2015-08-16 DIAGNOSIS — D225 Melanocytic nevi of trunk: Secondary | ICD-10-CM | POA: Diagnosis not present

## 2015-08-31 ENCOUNTER — Encounter: Payer: Self-pay | Admitting: Family Medicine

## 2015-08-31 ENCOUNTER — Ambulatory Visit (INDEPENDENT_AMBULATORY_CARE_PROVIDER_SITE_OTHER): Payer: Medicare Other | Admitting: Family Medicine

## 2015-08-31 VITALS — BP 138/76 | HR 56 | Temp 97.9°F | Ht 77.0 in | Wt 206.6 lb

## 2015-08-31 DIAGNOSIS — I5189 Other ill-defined heart diseases: Secondary | ICD-10-CM

## 2015-08-31 DIAGNOSIS — E7800 Pure hypercholesterolemia, unspecified: Secondary | ICD-10-CM | POA: Diagnosis not present

## 2015-08-31 DIAGNOSIS — I10 Essential (primary) hypertension: Secondary | ICD-10-CM

## 2015-08-31 DIAGNOSIS — I519 Heart disease, unspecified: Secondary | ICD-10-CM | POA: Diagnosis not present

## 2015-08-31 NOTE — Assessment & Plan Note (Addendum)
Borderline.  Some white coat.  BP highest early in morning and during exercise.  Majority of resting BP is normal Continue to monitor.  No meds for now.

## 2015-08-31 NOTE — Patient Instructions (Signed)
Get your fasting labs any time after 2/19.  Call the day before. Keep doing what your doing and enjoy the granddaughter.

## 2015-09-01 NOTE — Assessment & Plan Note (Signed)
Asymptomatic on now meds but on a low salt diet.  No change.

## 2015-09-01 NOTE — Progress Notes (Signed)
   Subjective:    Patient ID: Alan Gonzalez, male    DOB: June 26, 1946, 70 y.o.   MRN: [id number]  HPI Follow up hypertension and high cholesterol.  Issues 1. Had a work up of spells by cards, including a stress test.  He had an exaggerated BP response to initial exercise.  Dr. Tamala Julian put him on amlodipine and then decided to take him off.  Fitness level is great.  BPs are highest at onset of exercise and on morning awakening.  He brings in an extensive BP log which confirms that far more than 1/2 of BPs are at goal.  Spells are less frequent. 2. Possible hypoglycemia as the cause of spells.  He has monitored his BS and also brings in a log.  No sugars are low (and now are excessviely high.)  He has checked his BS during a lightheaded spell and the measure is typically in the 70-80 range.  He has lessened the frequency of spells with more frequent snacks and regularly eating breakfast. 3. He needs to wait another couple of weeks prior to next lipid panel.  Tolerating atorvastatin without problems. 4. Wt is right at midpoint of ideal BMI.  He ask because his diet change has caused him to gain about 10 lbs. 4. He had grade 1 diastolic dysfunction on echo.  No SOB or ankle swelling.  Great exercise capacity.     Review of Systems     Objective:   Physical ExamLungs clear Cardiac RRR without m or g        Assessment & Plan:

## 2015-09-01 NOTE — Assessment & Plan Note (Signed)
Check lipids in two weeks.

## 2015-09-07 DIAGNOSIS — C44319 Basal cell carcinoma of skin of other parts of face: Secondary | ICD-10-CM | POA: Diagnosis not present

## 2015-09-15 ENCOUNTER — Other Ambulatory Visit: Payer: Medicare Other

## 2015-09-15 DIAGNOSIS — I10 Essential (primary) hypertension: Secondary | ICD-10-CM | POA: Diagnosis not present

## 2015-09-15 LAB — COMPLETE METABOLIC PANEL WITH GFR
ALBUMIN: 4 g/dL (ref 3.6–5.1)
ALK PHOS: 64 U/L (ref 40–115)
ALT: 17 U/L (ref 9–46)
AST: 21 U/L (ref 10–35)
BUN: 18 mg/dL (ref 7–25)
CO2: 30 mmol/L (ref 20–31)
Calcium: 9.1 mg/dL (ref 8.6–10.3)
Chloride: 103 mmol/L (ref 98–110)
Creat: 1.25 mg/dL (ref 0.70–1.25)
GFR, EST AFRICAN AMERICAN: 68 mL/min (ref >=60)
GFR, EST NON AFRICAN AMERICAN: 59 mL/min — AB (ref >=60)
GLUCOSE: 103 mg/dL — AB (ref 65–99)
POTASSIUM: 5 mmol/L (ref 3.5–5.3)
SODIUM: 140 mmol/L (ref 135–146)
Total Bilirubin: 0.7 mg/dL (ref 0.2–1.2)
Total Protein: 6.8 g/dL (ref 6.1–8.1)

## 2015-09-15 LAB — LIPID PANEL
Cholesterol: 177 mg/dL (ref 125–200)
HDL: 56 mg/dL (ref >=40)
LDL CALC: 104 mg/dL (ref <130)
Total CHOL/HDL Ratio: 3.2 Ratio (ref <=5.0)
Triglycerides: 85 mg/dL (ref <150)
VLDL: 17 mg/dL (ref <30)

## 2015-09-15 NOTE — Progress Notes (Signed)
cmp and flp done today Alan Gonzalez 

## 2015-09-18 ENCOUNTER — Encounter: Payer: Self-pay | Admitting: Family Medicine

## 2015-10-26 ENCOUNTER — Other Ambulatory Visit: Payer: Self-pay | Admitting: Family Medicine

## 2015-11-02 DIAGNOSIS — K5730 Diverticulosis of large intestine without perforation or abscess without bleeding: Secondary | ICD-10-CM | POA: Diagnosis not present

## 2015-11-02 DIAGNOSIS — Z1211 Encounter for screening for malignant neoplasm of colon: Secondary | ICD-10-CM | POA: Diagnosis not present

## 2016-02-02 DIAGNOSIS — H2513 Age-related nuclear cataract, bilateral: Secondary | ICD-10-CM | POA: Diagnosis not present

## 2016-02-02 DIAGNOSIS — H10413 Chronic giant papillary conjunctivitis, bilateral: Secondary | ICD-10-CM | POA: Diagnosis not present

## 2016-02-20 IMAGING — NM NM MISC PROCEDURE
6 series · 36 of 36 positions shown · non-contrast
Comparison: none

[Series 1: stress-gsp · 6.40mm/px · 6 of 512 frames shown]
[frame 43/512]
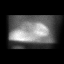
[frame 128/512]
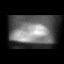
[frame 214/512]
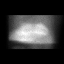
[frame 299/512]
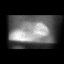
[frame 384/512]
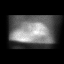
[frame 470/512]
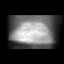

[Series 1: wbr_r-card_st rest · 6.4mm · 6.40mm/px · 6 of 24 frames shown]
[frame 3/24]
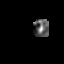
[frame 7/24]
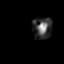
[frame 11/24]
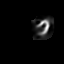
[frame 15/24]
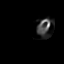
[frame 19/24]
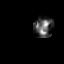
[frame 23/24]
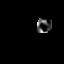

[Series 1: wbr_s-card_st stress-sum-em · 6.4mm · 6.40mm/px · 6 of 23 frames shown]
[frame 2/23]
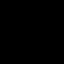
[frame 6/23]
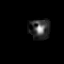
[frame 10/23]
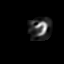
[frame 14/23]
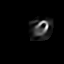
[frame 18/23]
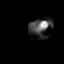
[frame 22/23]
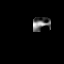

[Series 1: rest · 6.40mm/px · 6 of 64 frames shown]
[frame 6/64]
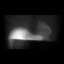
[frame 16/64]
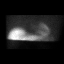
[frame 27/64]
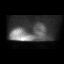
[frame 38/64]
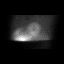
[frame 48/64]
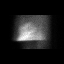
[frame 59/64]
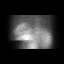

[Series 1: stress-sum-em · 6.40mm/px · 6 of 64 frames shown]
[frame 6/64]
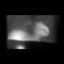
[frame 16/64]
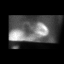
[frame 27/64]
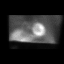
[frame 38/64]
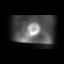
[frame 48/64]
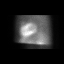
[frame 59/64]
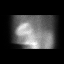

[Series 1: wbr_s-card_st stress-gsp · 6.4mm · 6.40mm/px · 6 of 177 frames shown]
[frame 15/177]
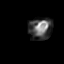
[frame 45/177]
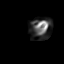
[frame 74/177]
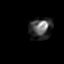
[frame 104/177]
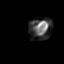
[frame 133/177]
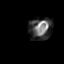
[frame 163/177]
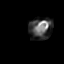

[36 of 36 positions shown; findings below may reference images not displayed]

Canned report from images found in remote index.

Refer to host system for actual result text.

## 2016-05-27 DIAGNOSIS — Z23 Encounter for immunization: Secondary | ICD-10-CM | POA: Diagnosis not present

## 2016-06-04 ENCOUNTER — Other Ambulatory Visit: Payer: Self-pay | Admitting: Family Medicine

## 2016-06-04 MED ORDER — DIAZEPAM 5 MG PO TABS: 5.0000 mg | ORAL_TABLET | Freq: Two times a day (BID) | ORAL | 1 refills | Status: DC | PRN

## 2016-08-21 DIAGNOSIS — Z85828 Personal history of other malignant neoplasm of skin: Secondary | ICD-10-CM | POA: Diagnosis not present

## 2016-08-21 DIAGNOSIS — D1801 Hemangioma of skin and subcutaneous tissue: Secondary | ICD-10-CM | POA: Diagnosis not present

## 2016-08-21 DIAGNOSIS — D485 Neoplasm of uncertain behavior of skin: Secondary | ICD-10-CM | POA: Diagnosis not present

## 2016-08-21 DIAGNOSIS — L570 Actinic keratosis: Secondary | ICD-10-CM | POA: Diagnosis not present

## 2016-08-21 DIAGNOSIS — D225 Melanocytic nevi of trunk: Secondary | ICD-10-CM | POA: Diagnosis not present

## 2016-08-26 ENCOUNTER — Telehealth: Payer: Self-pay

## 2016-08-26 NOTE — Telephone Encounter (Signed)
Last year blood work done 2/24.  Should wait one year to be certain Medicare pays.  Patient informed.

## 2016-08-26 NOTE — Telephone Encounter (Signed)
Pt has an appt on 2/15. Do you want him to come in and get his blood work done before the appt? He has medicaid, not sure they will pay for it if done before appointment. Please call on [phone] Ottis Stain, CMA

## 2016-09-05 ENCOUNTER — Ambulatory Visit (INDEPENDENT_AMBULATORY_CARE_PROVIDER_SITE_OTHER): Payer: Medicare Other | Admitting: Family Medicine

## 2016-09-05 ENCOUNTER — Encounter: Payer: Self-pay | Admitting: Family Medicine

## 2016-09-05 DIAGNOSIS — G629 Polyneuropathy, unspecified: Secondary | ICD-10-CM

## 2016-09-05 DIAGNOSIS — E559 Vitamin D deficiency, unspecified: Secondary | ICD-10-CM | POA: Diagnosis present

## 2016-09-05 DIAGNOSIS — E7800 Pure hypercholesterolemia, unspecified: Secondary | ICD-10-CM | POA: Diagnosis not present

## 2016-09-05 DIAGNOSIS — I10 Essential (primary) hypertension: Secondary | ICD-10-CM

## 2016-09-05 DIAGNOSIS — D696 Thrombocytopenia, unspecified: Secondary | ICD-10-CM

## 2016-09-05 NOTE — Assessment & Plan Note (Signed)
Recheck platelets.  He is not on ASA for primary prevention of CAD because of this issue.

## 2016-09-05 NOTE — Patient Instructions (Signed)
I will measure your vitamin d level again.  You should take a vit d (with calcium) daily.   You can get your blood work done any time after 09/14/16. The new shingles vaccine is Shingrix if you want to google it.

## 2016-09-05 NOTE — Assessment & Plan Note (Signed)
Recheck level 

## 2016-09-05 NOTE — Progress Notes (Signed)
   Subjective:    Patient ID: Alan Gonzalez, male    DOB: Jul 30, 1945, 71 y.o.   MRN: [id number]  HPI FU Multi issues: 1. 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  Required considerable physical exertion and some stress.  He did lose some weight in the process.  He also has gotten less sleep over the past 6 months. 2. Back pain is stable.  I did recently refill the diazepam which he uses rarely as a muscle relaxer. 3. C/O new bilateral elbow pain.  Worse in the mornings.  Feels it is deep in joints.  Also has a second type pain when he leans on his elbows. 4. Believes he injured left thumb sometime during the construction process.  He wants me to look in that it has not healed over the past month.   5. Diet remains generally healthy.  Amount of physical activity is great. 6. Up to date on HPDP.  Will need the new Shingrix vaccine.  Due for lipid panel later this month.   7. Neuropathy. Stable.  Both feet are frequently numb.  Denies dysasthesias.     Review of Systems Denies HA, CP, DOE, change in appetite, bowel or bladder.  No bleeding.  No worrisome skin lesions.  No memory concerns.       Objective:   Physical Exam HEENT normal Neck supple without masses Lungs clear Cardiac RRR without m or g Abd benign Ext no edema. Neuro motor and sensory grossly intact.        Assessment & Plan:

## 2016-09-05 NOTE — Assessment & Plan Note (Signed)
Nicely controled by diet and exercise.

## 2016-09-05 NOTE — Assessment & Plan Note (Signed)
Very healthy male with good habits and no at risk behavior.

## 2016-09-05 NOTE — Assessment & Plan Note (Signed)
Recheck labs, cont atorvastatin.

## 2016-09-05 NOTE — Assessment & Plan Note (Signed)
Stable, no meds needed.

## 2016-09-17 ENCOUNTER — Other Ambulatory Visit: Payer: Medicare Other

## 2016-09-17 DIAGNOSIS — E559 Vitamin D deficiency, unspecified: Secondary | ICD-10-CM

## 2016-09-17 DIAGNOSIS — E7800 Pure hypercholesterolemia, unspecified: Secondary | ICD-10-CM | POA: Diagnosis not present

## 2016-09-17 DIAGNOSIS — D696 Thrombocytopenia, unspecified: Secondary | ICD-10-CM

## 2016-09-17 LAB — LIPID PANEL
CHOL/HDL RATIO: 2.9 ratio (ref <5.0)
Cholesterol: 171 mg/dL (ref <200)
HDL: 59 mg/dL (ref >40)
LDL Cholesterol: 99 mg/dL (ref <100)
Triglycerides: 63 mg/dL (ref <150)
VLDL: 13 mg/dL (ref <30)

## 2016-09-17 LAB — CBC
HCT: 44.9 % (ref 38.5–50.0)
Hemoglobin: 14.8 g/dL (ref 13.2–17.1)
MCH: 31 pg (ref 27.0–33.0)
MCHC: 33 g/dL (ref 32.0–36.0)
MCV: 93.9 fL (ref 80.0–100.0)
MPV: 12 fL (ref 7.5–12.5)
PLATELETS: 184 10*3/uL (ref 140–400)
RBC: 4.78 MIL/uL (ref 4.20–5.80)
RDW: 13.9 % (ref 11.0–15.0)
WBC: 5.8 10*3/uL (ref 3.8–10.8)

## 2016-09-17 LAB — COMPLETE METABOLIC PANEL WITH GFR
ALBUMIN: 4 g/dL (ref 3.6–5.1)
ALK PHOS: 75 U/L (ref 40–115)
ALT: 14 U/L (ref 9–46)
AST: 19 U/L (ref 10–35)
BILIRUBIN TOTAL: 0.5 mg/dL (ref 0.2–1.2)
BUN: 16 mg/dL (ref 7–25)
CALCIUM: 9.1 mg/dL (ref 8.6–10.3)
CO2: 26 mmol/L (ref 20–31)
Chloride: 106 mmol/L (ref 98–110)
Creat: 1.1 mg/dL (ref 0.70–1.25)
GFR, EST AFRICAN AMERICAN: 79 mL/min (ref >=60)
GFR, Est Non African American: 68 mL/min (ref >=60)
GLUCOSE: 108 mg/dL — AB (ref 65–99)
POTASSIUM: 4.9 mmol/L (ref 3.5–5.3)
Sodium: 141 mmol/L (ref 135–146)
TOTAL PROTEIN: 6.9 g/dL (ref 6.1–8.1)

## 2016-09-18 ENCOUNTER — Encounter: Payer: Self-pay | Admitting: Family Medicine

## 2016-09-18 LAB — VITAMIN D 25 HYDROXY (VIT D DEFICIENCY, FRACTURES): Vit D, 25-Hydroxy: 27 ng/mL — ABNORMAL LOW (ref 30–100)

## 2017-02-04 ENCOUNTER — Other Ambulatory Visit: Payer: Self-pay | Admitting: Family Medicine

## 2017-02-17 ENCOUNTER — Other Ambulatory Visit: Payer: Self-pay | Admitting: Family Medicine

## 2017-02-17 MED ORDER — SILDENAFIL CITRATE 100 MG PO TABS: 100.0000 mg | ORAL_TABLET | Freq: Every day | ORAL | 3 refills | Status: DC | PRN

## 2017-04-07 DIAGNOSIS — Z23 Encounter for immunization: Secondary | ICD-10-CM | POA: Diagnosis not present

## 2017-06-05 ENCOUNTER — Ambulatory Visit (INDEPENDENT_AMBULATORY_CARE_PROVIDER_SITE_OTHER): Payer: Medicare Other | Admitting: *Deleted

## 2017-06-05 ENCOUNTER — Other Ambulatory Visit: Payer: Self-pay

## 2017-06-05 ENCOUNTER — Encounter: Payer: Self-pay | Admitting: *Deleted

## 2017-06-05 VITALS — BP 130/70 | HR 55 | Temp 97.7°F | Ht 77.0 in | Wt 207.2 lb

## 2017-06-05 DIAGNOSIS — Z0000 Encounter for general adult medical examination without abnormal findings: Secondary | ICD-10-CM

## 2017-06-05 NOTE — Progress Notes (Signed)
Patient ID: Alan Gonzalez, male   DOB: [birth year], 71 y.o.   MRN: [id number] I have reviewed this visit and discussed with Howell Rucks, RN, BSN, and agree with her documentation.

## 2017-06-05 NOTE — Patient Instructions (Addendum)
Alan Gonzalez,  Thank you for taking time to come for yourMedicare Wellness Visit. I appreciate your ongoing commitment to your health goals. Please review the following plan we discussed and let me know if I can assist you in the future.   These are the goals we discussed:  Goals    None    Maintain current level of physical activities   Fall Prevention in the Home Falls can cause injuries. They can happen to people of all ages. There are many things you can do to make your home safe and to help prevent falls. What can I do on the outside of my home?  Regularly fix the edges of walkways and driveways and fix any cracks.  Remove anything that might make you trip as you walk through a door, such as a raised step or threshold.  Trim any bushes or trees on the path to your home.  Use bright outdoor lighting.  Clear any walking paths of anything that might make someone trip, such as rocks or tools.  Regularly check to see if handrails are loose or broken. Make sure that both sides of any steps have handrails.  Any raised decks and porches should have guardrails on the edges.  Have any leaves, snow, or ice cleared regularly.  Use sand or salt on walking paths during winter.  Clean up any spills in your garage right away. This includes oil or grease spills. What can I do in the bathroom?  Use night lights.  Install grab bars by the toilet and in the tub and shower. Do not use towel bars as grab bars.  Use non-skid mats or decals in the tub or shower.  If you need to sit down in the shower, use a plastic, non-slip stool.  Keep the floor dry. Clean up any water that spills on the floor as soon as it happens.  Remove soap buildup in the tub or shower regularly.  Attach bath mats securely with double-sided non-slip rug tape.  Do not have throw rugs and other things on the floor that can make you trip. What can I do in the bedroom?  Use night lights.  Make sure that you  have a light by your bed that is easy to reach.  Do not use any sheets or blankets that are too big for your bed. They should not hang down onto the floor.  Have a firm chair that has side arms. You can use this for support while you get dressed.  Do not have throw rugs and other things on the floor that can make you trip. What can I do in the kitchen?  Clean up any spills right away.  Avoid walking on wet floors.  Keep items that you use a lot in easy-to-reach places.  If you need to reach something above you, use a strong step stool that has a grab bar.  Keep electrical cords out of the way.  Do not use floor polish or wax that makes floors slippery. If you must use wax, use non-skid floor wax.  Do not have throw rugs and other things on the floor that can make you trip. What can I do with my stairs?  Do not leave any items on the stairs.  Make sure that there are handrails on both sides of the stairs and use them. Fix handrails that are broken or loose. Make sure that handrails are as long as the stairways.  Check any carpeting to make sure  that it is firmly attached to the stairs. Fix any carpet that is loose or worn.  Avoid having throw rugs at the top or bottom of the stairs. If you do have throw rugs, attach them to the floor with carpet tape.  Make sure that you have a light switch at the top of the stairs and the bottom of the stairs. If you do not have them, ask someone to add them for you. What else can I do to help prevent falls?  Wear shoes that: ? Do not have high heels. ? Have rubber bottoms. ? Are comfortable and fit you well. ? Are closed at the toe. Do not wear sandals.  If you use a stepladder: ? Make sure that it is fully opened. Do not climb a closed stepladder. ? Make sure that both sides of the stepladder are locked into place. ? Ask someone to hold it for you, if possible.  Clearly mark and make sure that you can see: ? Any grab bars or  handrails. ? First and last steps. ? Where the edge of each step is.  Use tools that help you move around (mobility aids) if they are needed. These include: ? Canes. ? Walkers. ? Scooters. ? Crutches.  Turn on the lights when you go into a dark area. Replace any light bulbs as soon as they burn out.  Set up your furniture so you have a clear path. Avoid moving your furniture around.  If any of your floors are uneven, fix them.  If there are any pets around you, be aware of where they are.  Review your medicines with your doctor. Some medicines can make you feel dizzy. This can increase your chance of falling. Ask your doctor what other things that you can do to help prevent falls. This information is not intended to replace advice given to you by your health care provider. Make sure you discuss any questions you have with your health care provider. Document Released: 05/04/2009 Document Revised: 12/14/2015 Document Reviewed: 08/12/2014 Elsevier Interactive Patient Education  2018 [employer] Maintenance, Male A healthy lifestyle and preventive care is important for your health and wellness. Ask your health care provider about what schedule of regular examinations is right for you. What should I know about weight and diet? Eat a Healthy Diet  Eat plenty of vegetables, fruits, whole grains, low-fat dairy products, and lean protein.  Do not eat a lot of foods high in solid fats, added sugars, or salt.  Maintain a Healthy Weight Regular exercise can help you achieve or maintain a healthy weight. You should:  Do at least 150 minutes of exercise each week. The exercise should increase your heart rate and make you sweat (moderate-intensity exercise).  Do strength-training exercises at least twice a week.  Watch Your Levels of Cholesterol and Blood Lipids  Have your blood tested for lipids and cholesterol every 5 years starting at 70 years of age. If you are at high  risk for heart disease, you should start having your blood tested when you are 70 years old. You may need to have your cholesterol levels checked more often if: ? Your lipid or cholesterol levels are high. ? You are older than 70 years of age. ? You are at high risk for heart disease.  What should I know about cancer screening? Many types of cancers can be detected early and may often be prevented. Lung Cancer  You should be screened every year  for lung cancer if: ? You are a current smoker who has smoked for at least 30 years. ? You are a former smoker who has quit within the past 15 years.  Talk to your health care provider about your screening options, when you should start screening, and how often you should be screened.  Colorectal Cancer  Routine colorectal cancer screening usually begins at 70 years of age and should be repeated every 5-10 years until you are 70 years old. You may need to be screened more often if early forms of precancerous polyps or small growths are found. Your health care provider may recommend screening at an earlier age if you have risk factors for colon cancer.  Your health care provider may recommend using home test kits to check for hidden blood in the stool.  A small camera at the end of a tube can be used to examine your colon (sigmoidoscopy or colonoscopy). This checks for the earliest forms of colorectal cancer.  Prostate and Testicular Cancer  Depending on your age and overall health, your health care provider may do certain tests to screen for prostate and testicular cancer.  Talk to your health care provider about any symptoms or concerns you have about testicular or prostate cancer.  Skin Cancer  Check your skin from head to toe regularly.  Tell your health care provider about any new moles or changes in moles, especially if: ? There is a change in a mole's size, shape, or color. ? You have a mole that is larger than a pencil  eraser.  Always use sunscreen. Apply sunscreen liberally and repeat throughout the day.  Protect yourself by wearing long sleeves, pants, a wide-brimmed hat, and sunglasses when outside.  What should I know about heart disease, diabetes, and high blood pressure?  If you are 8-79 years of age, have your blood pressure checked every 3-5 years. If you are 62 years of age or older, have your blood pressure checked every year. You should have your blood pressure measured twice-once when you are at a hospital or clinic, and once when you are not at a hospital or clinic. Record the average of the two measurements. To check your blood pressure when you are not at a hospital or clinic, you can use: ? An automated blood pressure machine at a pharmacy. ? A home blood pressure monitor.  Talk to your health care provider about your target blood pressure.  If you are between 51-30 years old, ask your health care provider if you should take aspirin to prevent heart disease.  Have regular diabetes screenings by checking your fasting blood sugar level. ? If you are at a normal weight and have a low risk for diabetes, have this test once every three years after the age of 17. ? If you are overweight and have a high risk for diabetes, consider being tested at a younger age or more often.  A one-time screening for abdominal aortic aneurysm (AAA) by ultrasound is recommended for men aged 69-75 years who are current or former smokers. What should I know about preventing infection? Hepatitis B If you have a higher risk for hepatitis B, you should be screened for this virus. Talk with your health care provider to find out if you are at risk for hepatitis B infection. Hepatitis C Blood testing is recommended for:  Everyone born from [birth year] through 1964.  Anyone with known risk factors for hepatitis C.  Sexually Transmitted Diseases (STDs)  You should  be screened each year for STDs including gonorrhea and  chlamydia if: ? You are sexually active and are younger than 71 years of age. ? You are older than 70 years of age and your health care provider tells you that you are at risk for this type of infection. ? Your sexual activity has changed since you were last screened and you are at an increased risk for chlamydia or gonorrhea. Ask your health care provider if you are at risk.  Talk with your health care provider about whether you are at high risk of being infected with HIV. Your health care provider may recommend a prescription medicine to help prevent HIV infection.  What else can I do?  Schedule regular health, dental, and eye exams.  Stay current with your vaccines (immunizations).  Do not use any tobacco products, such as cigarettes, chewing tobacco, and e-cigarettes. If you need help quitting, ask your health care provider.  Limit alcohol intake to no more than 2 drinks per day. One drink equals 12 ounces of beer, 5 ounces of wine, or 1 ounces of hard liquor.  Do not use street drugs.  Do not share needles.  Ask your health care provider for help if you need support or information about quitting drugs.  Tell your health care provider if you often feel depressed.  Tell your health care provider if you have ever been abused or do not feel safe at home. This information is not intended to replace advice given to you by your health care provider. Make sure you discuss any questions you have with your health care provider. Document Released: 01/04/2008 Document Revised: 03/06/2016 Document Reviewed: 04/11/2015 Elsevier Interactive Patient Education  [employer].

## 2017-06-05 NOTE — Progress Notes (Signed)
Subjective:   Alan Gonzalez is a 71 y.o. male who presents for Medicare Annual/Subsequent preventive examination.  Cardiac Risk Factors include: advanced age (>4men, >22 women);dyslipidemia;hypertension;male gender     Objective:    Vitals: BP 130/70 (BP Location: Left Arm, Patient Position: Sitting, Cuff Size: Normal)   Pulse (!) 55   Temp 97.7 F (36.5 C) (Oral)   Ht 6\' 5"  (1.956 m)   Wt 207 lb 3.2 oz (94 kg)   SpO2 99%   BMI 24.57 kg/m   Body mass index is 24.57 kg/m.  Tobacco Social History   Tobacco Use  Smoking Status Never Smoker  Smokeless Tobacco Never Used     Patient has never smoked and has no plans to start.   Past Medical History:  Diagnosis Date  . Diverticulitis   . Hyperlipidemia   . Hypertension    Past Surgical History:  Procedure Laterality Date  . SPINE SURGERY     Family History  Problem Relation Age of Onset  . Cancer Mother        Vulvar  . COPD Father   . Cancer Sister        breast  . Heart disease Sister   . Heart disease Brother   . Thyroid disease Sister    Social History   Substance and Sexual Activity  Sexual Activity Yes   Comment: monagomous    Outpatient Encounter Medications as of 06/05/2017  Medication Sig  . atorvastatin (LIPITOR) 10 MG tablet TAKE 1 TABLET BY MOUTH DAILY AT BEDTIME  . sildenafil (VIAGRA) 100 MG tablet Take 1 tablet (100 mg total) by mouth daily as needed for erectile dysfunction.  Marland Kitchen acyclovir (ZOVIRAX) 800 MG tablet Take 800 mg by mouth 2 (two) times daily as needed (MOUTH ULCERS).  Marland Kitchen amoxicillin-clavulanate (AUGMENTIN) 875-125 MG tablet Take 1 tablet by mouth 2 (two) times daily as needed (Diverticulitis).  . diazepam (VALIUM) 5 MG tablet Take 1 tablet (5 mg total) by mouth every 12 (twelve) hours as needed for muscle spasms. (Patient not taking: Reported on 06/05/2017)   No facility-administered encounter medications on file as of 06/05/2017.   Patient also takes Vit D 1000 IU  daily  Activities of Daily Living In your present state of health, do you have any difficulty performing the following activities: 06/05/2017  Hearing? Y  Vision? Y  Difficulty concentrating or making decisions? N  Walking or climbing stairs? N  Dressing or bathing? N  Doing errands, shopping? N  Preparing Food and eating ? N  Using the Toilet? N  In the past six months, have you accidently leaked urine? N  Do you have problems with loss of bowel control? N  Managing your Medications? N  Managing your Finances? N  Housekeeping or managing your Housekeeping? N  Some recent data might be hidden   Home Safety:  My home has a working smoke alarm:  Yes X 3           My home throw rugs have been fastened down to the floor or removed:  No, discussed removing or tacking down I have a non-slip surface or non-slip mats in the bathtub and shower:  Non-slip surface        All my home's stairs have handrails, including any outdoor stairs  Two level home with handrails inside and 2 outside steps without handrail          My home's floors, stairs and hallways are free from clutter, wires and  cords:  Yes     I have animals in my home  No I wear seatbelts consistently:  Yes   Patient Care Team: Zenia Resides, MD as PCP - General Clent Jacks, MD (Ophthalmology) Mariana Arn (Dentistry)   Assessment:     Exercise Activities and Dietary recommendations Current Exercise Habits: Structured exercise class, Type of exercise: strength training/weights;Other - see comments(Aerobics), Time (Minutes): 60, Frequency (Times/Week): 6, Weekly Exercise (Minutes/Week): 360, Intensity: Intense, Exercise limited by: None identified  Goals    None    Maintain current level of physical activity (pt-stated)  Fall Risk Fall Risk  06/05/2017 09/05/2016 08/31/2015 05/03/2015 09/09/2014  Falls in the past year? No No No No No  Number falls in past yr: - - - - -  Injury with Fall? - - - - -   Depression  Screen PHQ 2/9 Scores 06/05/2017 09/05/2016 08/31/2015 05/03/2015  PHQ - 2 Score 0 0 0 0   TUG Test:  Done in 7 seconds. Falls prevention discussed in detail and literature given.  Cognitive Function: Mini-Cog  Passed with score 4/5   Cognitive Function MMSE - Mini Mental State Exam 08/23/2013 04/23/2012  Orientation to time 5 5  Orientation to Place 5 5  Registration 3 3  Attention/ Calculation 5 5  Recall 3 3  Language- name 2 objects 2 2  Language- repeat 1 1  Language- follow 3 step command 3 3  Language- read & follow direction 1 1  Write a sentence 1 1  Copy design 1 1  Total score 30 30        Immunization History  Administered Date(s) Administered  . Influenza Split 04/09/2012  . Influenza Whole 05/24/2008  . Influenza,inj,Quad PF,6+ Mos 06/02/2014, 05/03/2015  . Influenza-Unspecified 05/03/2013, 06/05/2016  . Pneumococcal Conjugate-13 08/27/2013  . Pneumococcal Polysaccharide-23 08/21/2012  . Td 02/20/1999  . Tdap 08/09/2011  . Zoster 10/27/2008   Screening Tests Health Maintenance  Topic Date Due  . INFLUENZA VACCINE  02/19/2017  . LIPID PANEL  09/17/2017  . TETANUS/TDAP  08/08/2021  . COLONOSCOPY  11/01/2025  . Hepatitis C Screening  Completed  . PNA vac Low Risk Adult  Completed      Plan:   Patient received flu vaccine at [facility] on 04/25/2017. Added to historical immunizations.  I have personally reviewed and noted the following in the patient's chart:   . Medical and social history . Use of alcohol, tobacco or illicit drugs  . Current medications and supplements . Functional ability and status . Nutritional status . Physical activity . Advanced directives . List of other physicians . Hospitalizations, surgeries, and ER visits in previous 12 months . Vitals . Screenings to include cognitive, depression, and falls . Referrals and appointments  In addition, I have reviewed and discussed with patient certain preventive protocols, quality  metrics, and best practice recommendations. A written personalized care plan for preventive services as well as general preventive health recommendations were provided to patient.     Velora Heckler, RN  06/05/2017

## 2017-08-12 ENCOUNTER — Telehealth: Payer: Self-pay | Admitting: Family Medicine

## 2017-08-12 NOTE — Telephone Encounter (Signed)
Last bloodwork done on 09/17/16.  To insure Medicare coverage, wait a full calendar year.  Called and LM with patient that we would check labs after the visit.

## 2017-08-12 NOTE — Telephone Encounter (Signed)
Pt called to make his yearly physical appt w/Hensel. He made it for 2/19 and would like someone to give him a call back to tell him if Hensel wants him to get bloodwork done before the physical. Please advise

## 2017-08-25 DIAGNOSIS — Z85828 Personal history of other malignant neoplasm of skin: Secondary | ICD-10-CM | POA: Diagnosis not present

## 2017-08-25 DIAGNOSIS — L723 Sebaceous cyst: Secondary | ICD-10-CM | POA: Diagnosis not present

## 2017-08-25 DIAGNOSIS — D224 Melanocytic nevi of scalp and neck: Secondary | ICD-10-CM | POA: Diagnosis not present

## 2017-08-25 DIAGNOSIS — D225 Melanocytic nevi of trunk: Secondary | ICD-10-CM | POA: Diagnosis not present

## 2017-09-09 ENCOUNTER — Ambulatory Visit (INDEPENDENT_AMBULATORY_CARE_PROVIDER_SITE_OTHER): Payer: Medicare Other | Admitting: Family Medicine

## 2017-09-09 ENCOUNTER — Other Ambulatory Visit: Payer: Self-pay

## 2017-09-09 ENCOUNTER — Encounter: Payer: Self-pay | Admitting: Family Medicine

## 2017-09-09 DIAGNOSIS — E7800 Pure hypercholesterolemia, unspecified: Secondary | ICD-10-CM

## 2017-09-09 DIAGNOSIS — D696 Thrombocytopenia, unspecified: Secondary | ICD-10-CM | POA: Diagnosis not present

## 2017-09-09 DIAGNOSIS — N200 Calculus of kidney: Secondary | ICD-10-CM | POA: Diagnosis not present

## 2017-09-09 DIAGNOSIS — Z0000 Encounter for general adult medical examination without abnormal findings: Secondary | ICD-10-CM

## 2017-09-09 DIAGNOSIS — Z8349 Family history of other endocrine, nutritional and metabolic diseases: Secondary | ICD-10-CM | POA: Insufficient documentation

## 2017-09-09 DIAGNOSIS — Z0001 Encounter for general adult medical examination with abnormal findings: Secondary | ICD-10-CM | POA: Diagnosis not present

## 2017-09-09 DIAGNOSIS — R319 Hematuria, unspecified: Secondary | ICD-10-CM | POA: Diagnosis not present

## 2017-09-09 HISTORY — DX: Family history of other endocrine, nutritional and metabolic diseases: Z83.49

## 2017-09-09 NOTE — Assessment & Plan Note (Signed)
Very health male with no at risk behaviors. 

## 2017-09-09 NOTE — Assessment & Plan Note (Signed)
Will refer to uro.  I want to attribute all this to kidney stones but am cognizant of the possibility of bladder or renal cancer.

## 2017-09-09 NOTE — Assessment & Plan Note (Addendum)
Will check ferritin and recommended genetic testing.

## 2017-09-09 NOTE — Patient Instructions (Signed)
Get your blood work done any time after 2/27. Someone will call with the urology referral Send me the genetic tests to order for hemochromatosis. I will call with results.   Stay healthy.

## 2017-09-09 NOTE — Assessment & Plan Note (Signed)
Check labs 

## 2017-09-09 NOTE — Assessment & Plan Note (Signed)
Likely cause of hematuria.  Still will proceed with urology referral.

## 2017-09-09 NOTE — Assessment & Plan Note (Signed)
Cbc

## 2017-09-09 NOTE — Progress Notes (Signed)
   Subjective:    Patient ID: Alan Gonzalez, male    DOB: 04-Jun-1946, 72 y.o.   MRN: [id number]  HPI Annual health maint exam.  Alan Gonzalez continues to be a very healthy male with great lifestyle choices and no at risk behavior.  Has vigorous activities (largely built his own [place] cabin[place]  No tobacco, moderate alcohol.  Monogamous.  Wears seat belt and no risk taking behaviors.  A few issues. 1. High cholesterol.  Due for check. 2. Hx of kidney stones.  Several recent bouts of gross hematuria accompanied by mild right flank discomfort.  This was a previous problem for which he remotely had cysto to RO bladder cancer.   3. Mild erectile dysfunction with suboptimal response to cialis.   4. Thrombocytopenia - more a qualitative platelet defect with easy bleeding.  No ASA and minimal NSAIDs.   5. New problem to me - Father had symptomatic hemochromatosis requiring regular blood letting.  By Alan Gonzalez daughter is a [employer] and tells [place] that he should be tested - mostly for the sake of his children.  Alan Gonzalez is asymptomatic unless you attribute some vague, intermitant fatique to hemochromatosis.  Never had elevated LFTs. 6. Otherwise up to date on all HPDP recs.    Review of Systems No CP, DOB, cough, or change in appetite, BM or weight.     Objective:   Physical Exam HEENT WNL,  Neck supple without mass Lungs clear, Cardiac RRR without m or g Abd benign Ext WNL       Assessment & Plan:

## 2017-09-16 ENCOUNTER — Telehealth: Payer: Self-pay | Admitting: Family Medicine

## 2017-09-16 NOTE — Telephone Encounter (Signed)
Pt wanted me to let Hensel know that he still has not gotten a call from the urologist.

## 2017-09-17 NOTE — Telephone Encounter (Signed)
I will ask [facility] team nurses to check.

## 2017-09-18 ENCOUNTER — Other Ambulatory Visit: Payer: Medicare Other

## 2017-09-18 DIAGNOSIS — D696 Thrombocytopenia, unspecified: Secondary | ICD-10-CM

## 2017-09-18 DIAGNOSIS — E7800 Pure hypercholesterolemia, unspecified: Secondary | ICD-10-CM | POA: Diagnosis not present

## 2017-09-18 DIAGNOSIS — Z8349 Family history of other endocrine, nutritional and metabolic diseases: Secondary | ICD-10-CM

## 2017-09-19 ENCOUNTER — Encounter: Payer: Self-pay | Admitting: Family Medicine

## 2017-09-23 LAB — CMP14+EGFR
A/G RATIO: 1.5 (ref 1.2–2.2)
ALBUMIN: 4.3 g/dL (ref 3.5–4.8)
ALT: 14 IU/L (ref 0–44)
AST: 16 IU/L (ref 0–40)
Alkaline Phosphatase: 91 IU/L (ref 39–117)
BUN / CREAT RATIO: 14 (ref 10–24)
BUN: 15 mg/dL (ref 8–27)
Bilirubin Total: 0.6 mg/dL (ref 0.0–1.2)
CALCIUM: 9.4 mg/dL (ref 8.6–10.2)
CO2: 25 mmol/L (ref 20–29)
CREATININE: 1.11 mg/dL (ref 0.76–1.27)
Chloride: 102 mmol/L (ref 96–106)
GFR, EST AFRICAN AMERICAN: 77 mL/min/{1.73_m2} (ref >59)
GFR, EST NON AFRICAN AMERICAN: 67 mL/min/{1.73_m2} (ref >59)
GLOBULIN, TOTAL: 2.8 g/dL (ref 1.5–4.5)
Glucose: 104 mg/dL — ABNORMAL HIGH (ref 65–99)
POTASSIUM: 4.8 mmol/L (ref 3.5–5.2)
Sodium: 142 mmol/L (ref 134–144)
Total Protein: 7.1 g/dL (ref 6.0–8.5)

## 2017-09-23 LAB — FERRITIN: Ferritin: 87 ng/mL (ref 30–400)

## 2017-09-23 LAB — CBC
HEMATOCRIT: 45.2 % (ref 37.5–51.0)
Hemoglobin: 15 g/dL (ref 13.0–17.7)
MCH: 31.4 pg (ref 26.6–33.0)
MCHC: 33.2 g/dL (ref 31.5–35.7)
MCV: 95 fL (ref 79–97)
PLATELETS: 203 10*3/uL (ref 150–379)
RBC: 4.78 x10E6/uL (ref 4.14–5.80)
RDW: 13.6 % (ref 12.3–15.4)
WBC: 6.7 10*3/uL (ref 3.4–10.8)

## 2017-09-23 LAB — LIPID PANEL
CHOLESTEROL TOTAL: 169 mg/dL (ref 100–199)
Chol/HDL Ratio: 3.5 ratio (ref 0.0–5.0)
HDL: 48 mg/dL (ref >39)
LDL Calculated: 103 mg/dL — ABNORMAL HIGH (ref 0–99)
TRIGLYCERIDES: 88 mg/dL (ref 0–149)
VLDL Cholesterol Cal: 18 mg/dL (ref 5–40)

## 2017-09-23 LAB — HEMOCHROMATOSIS DNA-PCR(C282Y,H63D)

## 2017-10-07 DIAGNOSIS — R310 Gross hematuria: Secondary | ICD-10-CM | POA: Diagnosis not present

## 2017-10-07 DIAGNOSIS — N486 Induration penis plastica: Secondary | ICD-10-CM | POA: Diagnosis not present

## 2017-10-07 DIAGNOSIS — N5201 Erectile dysfunction due to arterial insufficiency: Secondary | ICD-10-CM | POA: Diagnosis not present

## 2017-10-16 DIAGNOSIS — N200 Calculus of kidney: Secondary | ICD-10-CM | POA: Diagnosis not present

## 2017-10-16 DIAGNOSIS — R310 Gross hematuria: Secondary | ICD-10-CM | POA: Diagnosis not present

## 2017-10-21 DIAGNOSIS — N486 Induration penis plastica: Secondary | ICD-10-CM | POA: Diagnosis not present

## 2017-10-21 DIAGNOSIS — N5201 Erectile dysfunction due to arterial insufficiency: Secondary | ICD-10-CM | POA: Diagnosis not present

## 2017-10-21 DIAGNOSIS — R310 Gross hematuria: Secondary | ICD-10-CM | POA: Diagnosis not present

## 2017-10-21 DIAGNOSIS — N200 Calculus of kidney: Secondary | ICD-10-CM | POA: Diagnosis not present

## 2017-10-23 DIAGNOSIS — H10413 Chronic giant papillary conjunctivitis, bilateral: Secondary | ICD-10-CM | POA: Diagnosis not present

## 2017-10-23 DIAGNOSIS — H2513 Age-related nuclear cataract, bilateral: Secondary | ICD-10-CM | POA: Diagnosis not present

## 2017-12-08 DIAGNOSIS — L02212 Cutaneous abscess of back [any part, except buttock]: Secondary | ICD-10-CM | POA: Diagnosis not present

## 2017-12-08 DIAGNOSIS — L720 Epidermal cyst: Secondary | ICD-10-CM | POA: Diagnosis not present

## 2017-12-08 DIAGNOSIS — Z85828 Personal history of other malignant neoplasm of skin: Secondary | ICD-10-CM | POA: Diagnosis not present

## 2018-03-17 DIAGNOSIS — Z85828 Personal history of other malignant neoplasm of skin: Secondary | ICD-10-CM | POA: Diagnosis not present

## 2018-03-17 DIAGNOSIS — D230 Other benign neoplasm of skin of lip: Secondary | ICD-10-CM | POA: Diagnosis not present

## 2018-03-19 DIAGNOSIS — N200 Calculus of kidney: Secondary | ICD-10-CM | POA: Diagnosis not present

## 2018-04-08 ENCOUNTER — Other Ambulatory Visit: Payer: Self-pay | Admitting: Family Medicine

## 2018-05-04 ENCOUNTER — Other Ambulatory Visit: Payer: Self-pay | Admitting: Family Medicine

## 2018-05-07 DIAGNOSIS — R69 Illness, unspecified: Secondary | ICD-10-CM | POA: Diagnosis not present

## 2018-08-26 DIAGNOSIS — L821 Other seborrheic keratosis: Secondary | ICD-10-CM | POA: Diagnosis not present

## 2018-08-26 DIAGNOSIS — D485 Neoplasm of uncertain behavior of skin: Secondary | ICD-10-CM | POA: Diagnosis not present

## 2018-08-26 DIAGNOSIS — D225 Melanocytic nevi of trunk: Secondary | ICD-10-CM | POA: Diagnosis not present

## 2018-08-26 DIAGNOSIS — D1801 Hemangioma of skin and subcutaneous tissue: Secondary | ICD-10-CM | POA: Diagnosis not present

## 2018-08-26 DIAGNOSIS — Z85828 Personal history of other malignant neoplasm of skin: Secondary | ICD-10-CM | POA: Diagnosis not present

## 2018-09-01 DIAGNOSIS — Z0120 Encounter for dental examination and cleaning without abnormal findings: Secondary | ICD-10-CM | POA: Diagnosis not present

## 2018-09-10 ENCOUNTER — Encounter: Payer: Self-pay | Admitting: Family Medicine

## 2018-09-10 ENCOUNTER — Ambulatory Visit (INDEPENDENT_AMBULATORY_CARE_PROVIDER_SITE_OTHER): Payer: Medicare Other | Admitting: Family Medicine

## 2018-09-10 ENCOUNTER — Other Ambulatory Visit: Payer: Self-pay

## 2018-09-10 DIAGNOSIS — K5732 Diverticulitis of large intestine without perforation or abscess without bleeding: Secondary | ICD-10-CM | POA: Diagnosis not present

## 2018-09-10 DIAGNOSIS — Z0000 Encounter for general adult medical examination without abnormal findings: Secondary | ICD-10-CM

## 2018-09-10 DIAGNOSIS — E7800 Pure hypercholesterolemia, unspecified: Secondary | ICD-10-CM | POA: Diagnosis not present

## 2018-09-10 DIAGNOSIS — G8929 Other chronic pain: Secondary | ICD-10-CM

## 2018-09-10 DIAGNOSIS — M545 Low back pain, unspecified: Secondary | ICD-10-CM

## 2018-09-10 DIAGNOSIS — N529 Male erectile dysfunction, unspecified: Secondary | ICD-10-CM

## 2018-09-10 DIAGNOSIS — D696 Thrombocytopenia, unspecified: Secondary | ICD-10-CM | POA: Diagnosis not present

## 2018-09-10 MED ORDER — DIAZEPAM 5 MG PO TABS: 5.0000 mg | ORAL_TABLET | Freq: Two times a day (BID) | ORAL | 1 refills | Status: DC | PRN

## 2018-09-10 MED ORDER — AMOXICILLIN-POT CLAVULANATE 875-125 MG PO TABS: 1.0000 | ORAL_TABLET | Freq: Two times a day (BID) | ORAL | 3 refills | Status: DC | PRN

## 2018-09-10 NOTE — Patient Instructions (Addendum)
Great seeing you.   No changes.  I will call with lab results See me in one year

## 2018-09-11 LAB — CMP14+EGFR
A/G RATIO: 1.7 (ref 1.2–2.2)
ALT: 16 IU/L (ref 0–44)
AST: 19 IU/L (ref 0–40)
Albumin: 4.6 g/dL (ref 3.7–4.7)
Alkaline Phosphatase: 89 IU/L (ref 39–117)
BUN/Creatinine Ratio: 15 (ref 10–24)
BUN: 19 mg/dL (ref 8–27)
Bilirubin Total: 0.7 mg/dL (ref 0.0–1.2)
CO2: 25 mmol/L (ref 20–29)
Calcium: 9.6 mg/dL (ref 8.6–10.2)
Chloride: 103 mmol/L (ref 96–106)
Creatinine, Ser: 1.23 mg/dL (ref 0.76–1.27)
GFR calc Af Amer: 68 mL/min/{1.73_m2} (ref >59)
GFR calc non Af Amer: 59 mL/min/{1.73_m2} — ABNORMAL LOW (ref >59)
Globulin, Total: 2.7 g/dL (ref 1.5–4.5)
Glucose: 108 mg/dL — ABNORMAL HIGH (ref 65–99)
Potassium: 5.5 mmol/L — ABNORMAL HIGH (ref 3.5–5.2)
SODIUM: 141 mmol/L (ref 134–144)
Total Protein: 7.3 g/dL (ref 6.0–8.5)

## 2018-09-11 LAB — CBC
Hematocrit: 49 % (ref 37.5–51.0)
Hemoglobin: 16.4 g/dL (ref 13.0–17.7)
MCH: 31.7 pg (ref 26.6–33.0)
MCHC: 33.5 g/dL (ref 31.5–35.7)
MCV: 95 fL (ref 79–97)
Platelets: 207 10*3/uL (ref 150–450)
RBC: 5.18 x10E6/uL (ref 4.14–5.80)
RDW: 12.1 % (ref 11.6–15.4)
WBC: 6.2 10*3/uL (ref 3.4–10.8)

## 2018-09-11 LAB — LIPID PANEL
CHOLESTEROL TOTAL: 186 mg/dL (ref 100–199)
Chol/HDL Ratio: 3.3 ratio (ref 0.0–5.0)
HDL: 57 mg/dL (ref >39)
LDL CALC: 113 mg/dL — AB (ref 0–99)
TRIGLYCERIDES: 78 mg/dL (ref 0–149)
VLDL Cholesterol Cal: 16 mg/dL (ref 5–40)

## 2018-09-11 NOTE — Assessment & Plan Note (Signed)
Continue rare prn diazepam despite being Beers drug.  He is a very young 80.

## 2018-09-11 NOTE — Assessment & Plan Note (Signed)
Healthy male with no at risk behaviors. 

## 2018-09-11 NOTE — Assessment & Plan Note (Signed)
Stable on current dose of viagra

## 2018-09-11 NOTE — Progress Notes (Signed)
Established Patient Office Visit  Subjective:  Patient ID: Alan Gonzalez, male    DOB: [birth year]  Age: 73 y.o. MRN: [id number]  CC:  Chief Complaint  Patient presents with  . Annual Exam    HPI Alan Gonzalez presents for annual exam plus chronic issues. Largely healthy with no major concerns.  Minor concerns are: 1. Borderline HBP.  Was on meds for a while, now off.  List of home BP readings all good.  Continue to observe.  I took label of hypertension off problem list. 2. ED.  Treated well with prn Viagra.  Asks about marker for ASCVD.  No other sx.  Denies CP, SOB leg swelling. 3. Chronic low back pain.  Stable.  REmote surgery.  Does well with very rare use of diazepam as a muscle relaxer.   4. Divericulosis with occ flairs of diverticulitis.  He has done very well with home augmentin which he takes once or twice a year. 5. Recurrent oral herpes.  No recent outbreaks.  Does want to keep the acyclovir on he med list. 6. Hypercholesterolemia.  Needs labs.  On low dose atorvastatin for primary prevention.  HPDP up to date.  As above, needs lipids. Very active and healthy diet.  No at risk behaviors.   Past Medical History:  Diagnosis Date  . Diverticulitis   . Hyperlipidemia   . Hypertension     Past Surgical History:  Procedure Laterality Date  . SPINE SURGERY      Family History  Problem Relation Age of Onset  . Cancer Mother        Vulvar  . COPD Father   . Cancer Sister        breast  . Heart disease Sister   . Heart disease Brother   . Thyroid disease Sister     Social History   Socioeconomic History  . Marital status: Married    Spouse name: Alan Gonzalez  . Number of children: 3  . Years of education: PHD  . Highest education level: Not on file  Occupational History  . Occupation: PHD-physcologist     Employer: [employer]  Social Needs  . Financial resource strain: Not on file  . Food insecurity:    Worry: Not on file    Inability: Not  on file  . Transportation needs:    Medical: Not on file    Non-medical: Not on file  Tobacco Use  . Smoking status: Never Smoker  . Smokeless tobacco: Never Used  Substance and Sexual Activity  . Alcohol use: Yes    Alcohol/week: 7.0 standard drinks    Types: 7 drink(s) per week  . Drug use: No  . Sexual activity: Yes    Comment: monagomous  Lifestyle  . Physical activity:    Days per week: Not on file    Minutes per session: Not on file  . Stress: Not on file  Relationships  . Social connections:    Talks on phone: Not on file    Gets together: Not on file    Attends religious service: Not on file    Active member of club or organization: Not on file    Attends meetings of clubs or organizations: Not on file    Relationship status: Not on file  . Intimate partner violence:    Fear of current or ex partner: Not on file    Emotionally abused: Not on file    Physically abused: Not on file  Forced sexual activity: Not on file  Other Topics Concern  . Not on file  Social History Narrative   Health Care POA:    Emergency Contact: wife, Alan Gonzalez [phone]   End of Life Plan:    Diet: Pt has a varied diet of protein, starch, and vegetables/fruits   Seatbelts: Pt reports wearing seatbelt when in vehicles.    Alan Gonzalez Exposure/Protection: Pt reports wearing ball cap, long sleeve   Hobbies: golf, exercise, outdoors, reading      Current Social History 06/05/2017            Patient lives with wife Alan Gonzalez) in two level home 06/05/2017   Transportation: Patient has own vehicle and drives himself 82/95/6213   Important Relationships Spouse, kids, grandkids, friends 06/05/2017    Pets: None 06/05/2017   Education / Work:  PhD/ Psychologist 06/05/2017   Interests / Fun: Golf, travel, keep busy 06/05/2017   Current Stressors: Minor life hassles 06/05/2017   Religious / Personal Beliefs: Catholic 08/65/7846   L. Ducatte, RN, BSN                                                                                                   Outpatient Medications Prior to Visit  Medication Sig Dispense Refill  . acyclovir (ZOVIRAX) 800 MG tablet TAKE 1 TABLET BY MOUTH TWICE A DAY 20 tablet 11  . atorvastatin (LIPITOR) 10 MG tablet TAKE ONE TABLET BY MOUTH AT BEDTIME  90 tablet 3  . cholecalciferol (VITAMIN D) 1000 units tablet Take 1,000 Units daily by mouth.    . sildenafil (VIAGRA) 100 MG tablet Take 1 tablet (100 mg total) by mouth daily as needed for erectile dysfunction. 20 tablet 3  . amoxicillin-clavulanate (AUGMENTIN) 875-125 MG tablet Take 1 tablet by mouth 2 (two) times daily as needed (Diverticulitis).    . diazepam (VALIUM) 5 MG tablet Take 1 tablet (5 mg total) by mouth every 12 (twelve) hours as needed for muscle spasms. (Patient not taking: Reported on 06/05/2017) 30 tablet 1   No facility-administered medications prior to visit.     No Known Allergies  ROS Review of Systems    Denies CP, SOB, ABd pain, rash, focal neuro weakness or numbness. Denies change in bowel, bladder, appetite or wt Objective:    Physical Exam  BP 140/86   Pulse (!) 53   Temp 97.7 F (36.5 C) (Oral)   Ht _0  (1.956 m)   Wt 203 lb 12.8 oz (92.4 kg)   SpO2 99%   BMI 24.17 kg/m  Wt Readings from Last 3 Encounters:  09/10/18 203 lb 12.8 oz (92.4 kg)  09/09/17 206 lb 6.4 oz (93.6 kg)  06/05/17 207 lb 3.2 oz (94 kg)   VS noted.   HEENT WNL Neck supple without masses Lungs clear Cardiac RRR without m or g abd benign Ext no edema Neuro nl gait, mentation and gait.  Motor and sensory grossly normal.  There are no preventive care reminders to display for this patient.  There are no preventive care reminders to display for this patient.  Lab Results  Component Value Date   TSH 1.883 09/09/2014   Lab Results  Component Value Date   WBC 6.2 09/10/2018   HGB 16.4 09/10/2018   HCT 49.0 09/10/2018   MCV 95 09/10/2018   PLT 207 09/10/2018   Lab Results  Component Value  Date   NA 141 09/10/2018   K 5.5 (H) 09/10/2018   CO2 25 09/10/2018   GLUCOSE 108 (H) 09/10/2018   BUN 19 09/10/2018   CREATININE 1.23 09/10/2018   BILITOT 0.7 09/10/2018   ALKPHOS 89 09/10/2018   AST 19 09/10/2018   ALT 16 09/10/2018   PROT 7.3 09/10/2018   ALBUMIN 4.6 09/10/2018   CALCIUM 9.6 09/10/2018   Lab Results  Component Value Date   CHOL 186 09/10/2018   Lab Results  Component Value Date   HDL 57 09/10/2018   Lab Results  Component Value Date   LDLCALC 113 (H) 09/10/2018   Lab Results  Component Value Date   TRIG 78 09/10/2018   Lab Results  Component Value Date   CHOLHDL 3.3 09/10/2018   Lab Results  Component Value Date   HGBA1C 5.6 09/09/2014      Assessment & Plan:   Problem List Items Addressed This Visit    Thrombocytopenia ([place])   Relevant Orders   CBC (Completed)   Low back pain   Relevant Medications   diazepam (VALIUM) 5 MG tablet   HYPERCHOLESTEROLEMIA   Relevant Orders   Lipid panel (Completed)   CMP14+EGFR (Completed)   Diverticulitis of colon   Relevant Medications   amoxicillin-clavulanate (AUGMENTIN) 875-125 MG tablet      Meds ordered this encounter  Medications  . amoxicillin-clavulanate (AUGMENTIN) 875-125 MG tablet    Sig: Take 1 tablet by mouth 2 (two) times daily as needed (Diverticulitis).    Dispense:  20 tablet    Refill:  3  . diazepam (VALIUM) 5 MG tablet    Sig: Take 1 tablet (5 mg total) by mouth every 12 (twelve) hours as needed for muscle spasms.    Dispense:  30 tablet    Refill:  1    Follow-up: No follow-ups on file.    Zenia Resides, MD

## 2018-09-11 NOTE — Assessment & Plan Note (Signed)
Disussed lab result by phone.  Will bump dose to lipitor 20 mg to address slight rise in LDL and possibility of ED being a early marker for ASCVD risk.

## 2018-12-19 ENCOUNTER — Encounter: Payer: Self-pay | Admitting: Family Medicine

## 2018-12-22 MED ORDER — ROSUVASTATIN CALCIUM 10 MG PO TABS: 10.0000 mg | ORAL_TABLET | Freq: Every day | ORAL | 3 refills | Status: DC

## 2019-05-10 ENCOUNTER — Other Ambulatory Visit: Payer: Self-pay

## 2019-05-10 ENCOUNTER — Ambulatory Visit (INDEPENDENT_AMBULATORY_CARE_PROVIDER_SITE_OTHER): Payer: Medicare Other

## 2019-05-10 VITALS — BP 130/75 | Ht 77.0 in | Wt 200.0 lb

## 2019-05-10 DIAGNOSIS — Z0000 Encounter for general adult medical examination without abnormal findings: Secondary | ICD-10-CM

## 2019-05-10 NOTE — Progress Notes (Signed)
Subjective:   Alan Gonzalez is a 73 y.o. male who presents for Medicare Annual/Subsequent preventive examination.  The patient consented to a virtual visit.   Review of Systems: Defer to PCP  Cardiac Risk Factors include: advanced age (>37men, >72 women)  Objective:    Vitals: BP 130/75   Ht 6\' 5"  (1.956 m)   Wt 200 lb (90.7 kg)   BMI 23.72 kg/m   Body mass index is 23.72 kg/m.  Advanced Directives 05/10/2019 09/09/2017 06/05/2017 09/05/2016 08/31/2015 05/03/2015 09/09/2014  Does Patient Have a Medical Advance Directive? Yes No Yes Yes Yes Yes No  Type of Paramedic of [place];Living will - [place];Living will [place];Living will - - -  Does patient want to make changes to medical advance directive? - - No - Patient declined - - - -  Copy of [place] in Chart? No - copy requested - No - copy requested No - copy requested Yes Yes -  Would patient like information on creating a medical advance directive? - No - Patient declined - No - Patient declined - - No - patient declined information   Tobacco Social History   Tobacco Use  Smoking Status Never Smoker  Smokeless Tobacco Never Used       Clinical Intake:  Pre-visit preparation completed: Yes  How often do you need to have someone help you when you read instructions, pamphlets, or other written materials from your doctor or pharmacy?: 1 - Never What is the last grade level you completed in school?: College  Interpreter Needed?: No  Past Medical History:  Diagnosis Date  . Diverticulitis   . Hyperlipidemia    Past Surgical History:  Procedure Laterality Date  . SPINE SURGERY     1990s   Family History  Problem Relation Age of Onset  . Cancer Mother        Vulvar  . COPD Father   . Cancer Sister        breast  . Heart disease Sister   . Heart disease Brother   . Thyroid disease Sister    Social History    Socioeconomic History  . Marital status: Married    Spouse name: Alan Gonzalez  . Number of children: 3  . Years of education: PHD  . Highest education level: Not on file  Occupational History  . Occupation: PHD-physcologist     Employer: [employer]  Social Needs  . Financial resource strain: Not hard at all  . Food insecurity    Worry: Never true    Inability: Never true  . Transportation needs    Medical: No    Non-medical: No  Tobacco Use  . Smoking status: Never Smoker  . Smokeless tobacco: Never Used  Substance and Sexual Activity  . Alcohol use: Yes    Alcohol/week: 7.0 standard drinks    Types: 7 Glasses of wine per week    Comment: one glass of wine per night-medicinal reasons  . Drug use: No  . Sexual activity: Yes    Comment: monagomous  Lifestyle  . Physical activity    Days per week: 4 days    Minutes per session: 70 min  . Stress: Only a little  Relationships  . Social Herbalist on phone: Three times a week    Gets together: More than three times a week    Attends religious service: 1 to 4 times per year  Active member of club or organization: No    Attends meetings of clubs or organizations: Never    Relationship status: Married  Other Topics Concern  . Not on file  Social History Narrative   Emergency Contact: wife, Alan Gonzalez [phone]   Diet: Pt has a varied diet of protein, starch, and vegetables/fruits   Seatbelts: Pt reports wearing seatbelt when in vehicles.    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 acyclovir (ZOVIRAX) 800 MG tablet TAKE 1 TABLET BY MOUTH TWICE A DAY  . amoxicillin-clavulanate (AUGMENTIN) 875-125 MG tablet Take 1 tablet by mouth 2 (two) times daily as needed (Diverticulitis).  . cholecalciferol (VITAMIN D) 1000 units tablet Take 1,000 Units daily by mouth.  . diazepam (VALIUM) 5 MG tablet Take 1 tablet (5 mg total) by mouth every 12 (twelve) hours as needed for muscle spasms.  . rosuvastatin (CRESTOR) 10 MG tablet Take 1 tablet (10 mg total) by mouth daily.  . sildenafil (VIAGRA) 100 MG tablet Take 1 tablet (100 mg total) by mouth daily as needed for erectile dysfunction.   No facility-administered encounter medications on file as of 05/10/2019.    Activities of Daily Living In your present state of health, do you have any difficulty performing the following activities: 05/10/2019  Hearing? N  Vision? N  Difficulty concentrating or making decisions? N  Walking or climbing stairs? N  Dressing or bathing? N  Doing errands, shopping? N  Preparing Food and eating ? N  Using the Toilet? N  In the past six months, have you accidently leaked urine? N  Do you have problems with loss of bowel control? N  Managing your Medications? N  Managing your Finances? N  Housekeeping or managing your Housekeeping? N  Some recent data might be hidden    Patient Care Team: Zenia Resides, MD as PCP - Evalina Field, MD (Ophthalmology) Mariana Arn (Dentistry)   Assessment:   This is a routine wellness examination for Alan Gonzalez.  Exercise Activities and Dietary recommendations Current Exercise Habits: Structured exercise class, Type of exercise: strength training/weights, Time (Minutes): > 60, Frequency (Times/Week): 4,  Weekly Exercise (Minutes/Week): 0, Exercise limited by: None identified  Goals    . Maintain current level of physical activities (pt-stated)    . Maintain current level of physical activity (pt-stated)      Fall Risk  Fall Risk  05/10/2019 09/10/2018 09/09/2017 06/05/2017 09/05/2016  Falls in the past year? 0 0 No No No  Number falls in past yr: - - - - -  Injury with Fall? - - - - -   Is the patient's home free of loose throw rugs in walkways, pet beds, electrical cords, etc?   yes      Grab bars in the bathroom? yes      Handrails on the stairs?   yes      Adequate lighting?   yes  Patient rating of health (0-10): 8   Depression Screen PHQ 2/9 Scores 05/10/2019 05/10/2019 09/10/2018 09/09/2017  PHQ - 2 Score 0 0 0 0   Cognitive Function MMSE - Mini Mental State Exam 08/23/2013 04/23/2012  Orientation to time 5 5  Orientation to Place 5 5  Registration 3 3  Attention/ Calculation 5 5  Recall 3 3  Language- name 2 objects 2 2  Language- repeat 1 1  Language- follow 3 step command 3 3  Language- read & follow direction 1 1  Write a sentence 1 1  Copy design 1 1  Total score 30 30     6CIT Screen 05/10/2019  What Year? 0 points  What month? 0 points  What time? 0 points  Count back from 20 0 points  Months in reverse 0 points  Repeat phrase 0 points  Total Score 0    Immunization History  Administered Date(s) Administered  . Influenza Split 04/09/2012  . Influenza Whole 05/24/2008  . Influenza,inj,Quad PF,6+ Mos 06/02/2014, 05/03/2015  . Influenza-Unspecified 05/03/2013, 06/05/2016, 04/25/2017, 04/21/2018, 05/05/2019  . Pneumococcal Conjugate-13 08/27/2013  . Pneumococcal Polysaccharide-23 08/21/2012  . Td 02/20/1999  . Tdap 08/09/2011  . Zoster 10/27/2008    Screening Tests Health Maintenance  Topic Date Due  . INFLUENZA VACCINE  02/20/2019  . LIPID PANEL  09/11/2019  . TETANUS/TDAP  08/08/2021  . COLONOSCOPY  11/01/2025  . Hepatitis C Screening  Completed   . PNA vac Low Risk Adult  Completed   Cancer Screenings: Lung: Low Dose CT Chest recommended if Age 63-80 years, 30 pack-year currently smoking OR have quit w/in 15years. Patient does not qualify. Colorectal: 2027  Additional Screenings: Hepatitis C: Completed   Plan:  Maintain current physical activity. Due for physical with PCP ~February 2021.  I updated your chart to reflect recent flu vaccine given at [facility].   I have personally reviewed and noted the following in the patient's chart:   . Medical and social history . Use of alcohol, tobacco or illicit drugs  . Current medications and supplements . Functional ability and status . Nutritional status . Physical activity . Advanced directives . List of other physicians . Hospitalizations, surgeries, and ER visits in previous 12 months . Vitals . Screenings to include cognitive, depression, and falls . Referrals and appointments  In addition, I have reviewed and discussed with patient certain preventive protocols, quality metrics, and best practice recommendations. A written personalized care plan for preventive services as well as general preventive health recommendations were provided to patient.  This visit was conducted virtually in the setting of the [place] pandemic.    Dorna Bloom, [place]  05/10/2019

## 2019-05-10 NOTE — Progress Notes (Signed)
Patient ID: Alan Gonzalez, male   DOB: 12/22/1945, 73 y.o.   MRN: [id number]  [name] , Thank you for taking time to come for your Medicare Wellness Visit. I appreciate your ongoing commitment to your health goals. Please review the following plan we discussed and let me know if I can assist you in the future.   These are the goals we discussed: Goals    . Maintain current level of physical activities (pt-stated)    . Maintain current level of physical activity (pt-stated)       This is a list of the screening recommended for you and due dates:  Health Maintenance  Topic Date Due  . Flu Shot  02/20/2019  . Lipid (cholesterol) test  09/11/2019  . Tetanus Vaccine  08/08/2021  . Colon Cancer Screening  11/01/2025  .  Hepatitis C: One time screening is recommended by Center for Disease Control  (CDC) for  adults born from [birth year] through 1964.   Completed  . Pneumonia vaccines  Completed

## 2019-05-10 NOTE — Progress Notes (Signed)
Patient ID: Alan Gonzalez, male   DOB: 08/14/1945, 73 y.o.   MRN: [id number]  I have reviewed this visit and agree with the documentation.

## 2019-05-10 NOTE — Patient Instructions (Addendum)
You spoke to Alan Gonzalez, [place] over the phone for your annual wellness visit.  We discussed goals: Goals    . Maintain current level of physical activities (pt-stated)    . Maintain current level of physical activity (pt-stated)      We also discussed recommended health maintenance. As discussed, you are up to date with everything. I added your flu vaccine.   Health Maintenance  Topic Date Due  . INFLUENZA VACCINE  02/20/2019  . LIPID PANEL  09/11/2019  . TETANUS/TDAP  08/08/2021  . COLONOSCOPY  11/01/2025  . Hepatitis C Screening  Completed  . PNA vac Low Risk Adult  Completed   Maintain current physical activity. Due for physical with PCP ~February 2021.  I updated your chart to reflect recent flu vaccine given at [facility].    Preventive Care 93 Years and Older, Male Preventive care refers to lifestyle choices and visits with your health care provider that can promote health and wellness. This includes:  A yearly physical exam. This is also called an annual well check.  Regular dental and eye exams.  Immunizations.  Screening for certain conditions.  Healthy lifestyle choices, such as diet and exercise. What can I expect for my preventive care visit? Physical exam Your health care provider will check:  Height and weight. These may be used to calculate body mass index (BMI), which is a measurement that tells if you are at a healthy weight.  Heart rate and blood pressure.  Your skin for abnormal spots. Counseling Your health care provider may ask you questions about:  Alcohol, tobacco, and drug use.  Emotional well-being.  Home and relationship well-being.  Sexual activity.  Eating habits.  History of falls.  Memory and ability to understand (cognition).  Work and work Statistician. What immunizations do I need?  Influenza (flu) vaccine  This is recommended every year. Tetanus, diphtheria, and pertussis (Tdap) vaccine  You may need a Td booster  every 10 years. Varicella (chickenpox) vaccine  You may need this vaccine if you have not already been vaccinated. Zoster (shingles) vaccine  You may need this after age 94. Pneumococcal conjugate (PCV13) vaccine  One dose is recommended after age 77. Pneumococcal polysaccharide (PPSV23) vaccine  One dose is recommended after age 47. Measles, mumps, and rubella (MMR) vaccine  You may need at least one dose of MMR if you were born in 1957 or later. You may also need a second dose. Meningococcal conjugate (MenACWY) vaccine  You may need this if you have certain conditions. Hepatitis A vaccine  You may need this if you have certain conditions or if you travel or work in places where you may be exposed to hepatitis A. Hepatitis B vaccine  You may need this if you have certain conditions or if you travel or work in places where you may be exposed to hepatitis B. Haemophilus influenzae type b (Hib) vaccine  You may need this if you have certain conditions. You may receive vaccines as individual doses or as more than one vaccine together in one shot (combination vaccines). Talk with your health care provider about the risks and benefits of combination vaccines. What tests do I need? Blood tests  Lipid and cholesterol levels. These may be checked every 5 years, or more frequently depending on your overall health.  Hepatitis C test.  Hepatitis B test. Screening  Lung cancer screening. You may have this screening every year starting at age 73 if you have a 30-pack-year history of  smoking and currently smoke or have quit within the past 15 years.  Colorectal cancer screening. All adults should have this screening starting at age 5 and continuing until age 53. Your health care provider may recommend screening at age 73 if you are at increased risk. You will have tests every 1-10 years, depending on your results and the type of screening test.  Prostate cancer screening.  Recommendations will vary depending on your family history and other risks.  Diabetes screening. This is done by checking your blood sugar (glucose) after you have not eaten for a while (fasting). You may have this done every 1-3 years.  Abdominal aortic aneurysm (AAA) screening. You may need this if you are a current or former smoker.  Sexually transmitted disease (STD) testing. Follow these instructions at home: Eating and drinking  Eat a diet that includes fresh fruits and vegetables, whole grains, lean protein, and low-fat dairy products. Limit your intake of foods with high amounts of sugar, saturated fats, and salt.  Take vitamin and mineral supplements as recommended by your health care provider.  Do not drink alcohol if your health care provider tells you not to drink.  If you drink alcohol: ? Limit how much you have to 0-2 drinks a day. ? Be aware of how much alcohol is in your drink. In the [place] one drink equals one 12 oz bottle of beer (355 mL), one 5 oz glass of wine (148 mL), or one 1 oz glass of hard liquor (44 mL). Lifestyle  Take daily care of your teeth and gums.  Stay active. Exercise for at least 30 minutes on 5 or more days each week.  Do not use any products that contain nicotine or tobacco, such as cigarettes, e-cigarettes, and chewing tobacco. If you need help quitting, ask your health care provider.  If you are sexually active, practice safe sex. Use a condom or other form of protection to prevent STIs (sexually transmitted infections).  Talk with your health care provider about taking a low-dose aspirin or statin. What's next?  Visit your health care provider once a year for a well check visit.  Ask your health care provider how often you should have your eyes and teeth checked.  Stay up to date on all vaccines. This information is not intended to replace advice given to you by your health care provider. Make sure you discuss any questions you have with  your health care provider. Document Released: 08/04/2015 Document Revised: 07/02/2018 Document Reviewed: 07/02/2018 Elsevier Patient Education  2020 [place] [facility]'s number is [phone]. Please call with questions or concerns about what we discussed today.

## 2019-07-26 ENCOUNTER — Encounter: Payer: Self-pay | Admitting: Family Medicine

## 2019-08-10 ENCOUNTER — Ambulatory Visit: Payer: Medicare Other | Attending: Internal Medicine

## 2019-08-10 DIAGNOSIS — Z23 Encounter for immunization: Secondary | ICD-10-CM | POA: Insufficient documentation

## 2019-08-10 NOTE — Progress Notes (Signed)
   Covid-19 Vaccination Clinic  Name:  Alan Gonzalez    MRN: [id number] DOB: January 17, 1946  08/10/2019  Mr. Alan Gonzalez was observed post Covid-19 immunization for 15 minutes without incidence. He was provided with Vaccine Information Sheet and instruction to access the V-Safe system.   Mr. Alan Gonzalez was instructed to call 911 with any severe reactions post vaccine: Marland Kitchen Difficulty breathing  . Swelling of your face and throat  . A fast heartbeat  . A bad rash all over your body  . Dizziness and weakness    Immunizations Administered    Name Date Dose VIS Date Route   Pfizer COVID-19 Vaccine 08/10/2019  2:00 PM 0.3 mL 07/02/2019 Intramuscular   Manufacturer: [place]   Lot: [id number]   [place]: [id number]

## 2019-08-11 ENCOUNTER — Encounter: Payer: Self-pay | Admitting: Family Medicine

## 2019-08-12 ENCOUNTER — Telehealth: Payer: Self-pay | Admitting: *Deleted

## 2019-08-12 NOTE — Telephone Encounter (Signed)
LVM to call office to assist in getting him an appointment with Dr. Andria Frames.  Please assist in getting this scheduled sooner than later per Dr. Andria Frames in [place] message.Tyrian Peart Zimmerman Rumple, CMA

## 2019-08-19 ENCOUNTER — Ambulatory Visit: Payer: Medicare Other | Admitting: Family Medicine

## 2019-08-19 ENCOUNTER — Ambulatory Visit (HOSPITAL_COMMUNITY)
Admission: RE | Admit: 2019-08-19 | Discharge: 2019-08-19 | Disposition: A | Discharge status: Home / Self Care | Payer: Medicare Other | Source: Ambulatory Visit | Attending: Family Medicine | Admitting: Family Medicine

## 2019-08-19 ENCOUNTER — Encounter: Payer: Self-pay | Admitting: Family Medicine

## 2019-08-19 ENCOUNTER — Other Ambulatory Visit: Payer: Self-pay

## 2019-08-19 VITALS — BP 142/78 | HR 57 | Ht 77.0 in | Wt 209.0 lb

## 2019-08-19 DIAGNOSIS — I519 Heart disease, unspecified: Secondary | ICD-10-CM | POA: Insufficient documentation

## 2019-08-19 DIAGNOSIS — I5189 Other ill-defined heart diseases: Secondary | ICD-10-CM

## 2019-08-19 DIAGNOSIS — I499 Cardiac arrhythmia, unspecified: Secondary | ICD-10-CM | POA: Diagnosis not present

## 2019-08-19 DIAGNOSIS — R002 Palpitations: Secondary | ICD-10-CM | POA: Insufficient documentation

## 2019-08-19 HISTORY — DX: Palpitations: R00.2

## 2019-08-19 NOTE — Patient Instructions (Signed)
I have entered the cards referral.  Someone should call soon. I will touch base with Dr. Tamala Julian to see if he wants me to enter any test orders in advance of the visit.

## 2019-08-19 NOTE — Progress Notes (Signed)
   Subjective:    Patient ID: Alan Gonzalez, male    DOB: Nov 22, 1945, 74 y.o.   MRN: [id number]  HPI Patient feels irregular heart beat.  Especially at rest when heart rate is slowest.  He has researched and the sensation is typical for what he has read about PVCs  Only other symptom is slowly progressive fatigue with exertion.  Progressive over last four years.    No chest pain, syncope or lightheadedness.    Previous cardiac WU in 2016 included normal echo and gated stress tests.  NOTE DISCREPANCY:  EF on echo was normal but EF on gated stress test was only 47% with diffuse hypokinesis.       Review of Systems     Objective:   Physical Exam Lungs clear Cardiac RRR with 2/6 SEM  No peripheral edema.  EKG bradycardia without extra beats.  Still with read of anteroseptal MI age indeterminant.       Assessment & Plan:

## 2019-08-19 NOTE — Assessment & Plan Note (Signed)
Will need some long term monitor.  Will refer to cards.  Message them to see if add orders in advance.

## 2019-08-19 NOTE — Assessment & Plan Note (Signed)
Unclear if he has any CHF.  Mild diastolic dysfunction on Q000111Q echo.  I am more concerned about 47% EF seen on gated study.  Refer to cards.  Touch base to see if they want orders in advance

## 2019-08-30 ENCOUNTER — Ambulatory Visit: Payer: Medicare Other | Attending: Internal Medicine

## 2019-08-30 DIAGNOSIS — Z23 Encounter for immunization: Secondary | ICD-10-CM

## 2019-08-30 NOTE — Progress Notes (Signed)
   Covid-19 Vaccination Clinic  Name:  Alan Gonzalez    MRN: [id number] DOB: 1946/05/02  08/30/2019  Alan Gonzalez was observed post Covid-19 immunization for 15 minutes without incidence. He was provided with Vaccine Information Sheet and instruction to access the V-Safe system.   Alan Gonzalez was instructed to call 911 with any severe reactions post vaccine: Marland Kitchen Difficulty breathing  . Swelling of your face and throat  . A fast heartbeat  . A bad rash all over your body  . Dizziness and weakness    Immunizations Administered    Name Date Dose VIS Date Route   Pfizer COVID-19 Vaccine 08/30/2019  2:38 PM 0.3 mL 07/02/2019 Intramuscular   Manufacturer: [place]   Lot: [id number]   [place]: [id number]

## 2019-08-31 ENCOUNTER — Ambulatory Visit: Payer: Medicare Other

## 2019-09-01 DIAGNOSIS — Z85828 Personal history of other malignant neoplasm of skin: Secondary | ICD-10-CM | POA: Diagnosis not present

## 2019-09-01 DIAGNOSIS — D1801 Hemangioma of skin and subcutaneous tissue: Secondary | ICD-10-CM | POA: Diagnosis not present

## 2019-09-01 DIAGNOSIS — L821 Other seborrheic keratosis: Secondary | ICD-10-CM | POA: Diagnosis not present

## 2019-09-01 DIAGNOSIS — D225 Melanocytic nevi of trunk: Secondary | ICD-10-CM | POA: Diagnosis not present

## 2019-09-07 ENCOUNTER — Encounter: Payer: Self-pay | Admitting: Family Medicine

## 2019-09-07 ENCOUNTER — Ambulatory Visit: Payer: Medicare Other

## 2019-09-07 DIAGNOSIS — N200 Calculus of kidney: Secondary | ICD-10-CM

## 2019-09-07 DIAGNOSIS — E7800 Pure hypercholesterolemia, unspecified: Secondary | ICD-10-CM

## 2019-09-07 DIAGNOSIS — R002 Palpitations: Secondary | ICD-10-CM

## 2019-09-14 ENCOUNTER — Other Ambulatory Visit: Payer: Medicare Other

## 2019-09-14 ENCOUNTER — Other Ambulatory Visit: Payer: Self-pay

## 2019-09-14 DIAGNOSIS — N200 Calculus of kidney: Secondary | ICD-10-CM | POA: Diagnosis not present

## 2019-09-14 DIAGNOSIS — E7800 Pure hypercholesterolemia, unspecified: Secondary | ICD-10-CM | POA: Diagnosis not present

## 2019-09-14 DIAGNOSIS — R002 Palpitations: Secondary | ICD-10-CM | POA: Diagnosis not present

## 2019-09-15 LAB — CMP14+EGFR
ALT: 11 IU/L (ref 0–44)
AST: 20 IU/L (ref 0–40)
Albumin/Globulin Ratio: 2 (ref 1.2–2.2)
Albumin: 4.4 g/dL (ref 3.7–4.7)
Alkaline Phosphatase: 84 IU/L (ref 39–117)
BUN/Creatinine Ratio: 15 (ref 10–24)
BUN: 16 mg/dL (ref 8–27)
Bilirubin Total: 0.6 mg/dL (ref 0.0–1.2)
CO2: 24 mmol/L (ref 20–29)
Calcium: 9.6 mg/dL (ref 8.6–10.2)
Chloride: 104 mmol/L (ref 96–106)
Creatinine, Ser: 1.08 mg/dL (ref 0.76–1.27)
GFR calc Af Amer: 79 mL/min/{1.73_m2} (ref >59)
GFR calc non Af Amer: 68 mL/min/{1.73_m2} (ref >59)
Globulin, Total: 2.2 g/dL (ref 1.5–4.5)
Glucose: 94 mg/dL (ref 65–99)
Potassium: 4.6 mmol/L (ref 3.5–5.2)
Sodium: 142 mmol/L (ref 134–144)
Total Protein: 6.6 g/dL (ref 6.0–8.5)

## 2019-09-15 LAB — LIPID PANEL
Chol/HDL Ratio: 3.1 ratio (ref 0.0–5.0)
Cholesterol, Total: 165 mg/dL (ref 100–199)
HDL: 54 mg/dL (ref >39)
LDL Chol Calc (NIH): 92 mg/dL (ref 0–99)
Triglycerides: 102 mg/dL (ref 0–149)
VLDL Cholesterol Cal: 19 mg/dL (ref 5–40)

## 2019-09-15 LAB — CBC
Hematocrit: 45.8 % (ref 37.5–51.0)
Hemoglobin: 15.3 g/dL (ref 13.0–17.7)
MCH: 31.3 pg (ref 26.6–33.0)
MCHC: 33.4 g/dL (ref 31.5–35.7)
MCV: 94 fL (ref 79–97)
Platelets: 161 10*3/uL (ref 150–450)
RBC: 4.89 x10E6/uL (ref 4.14–5.80)
RDW: 12.5 % (ref 11.6–15.4)
WBC: 6 10*3/uL (ref 3.4–10.8)

## 2019-09-15 LAB — URIC ACID: Uric Acid: 6.6 mg/dL (ref 3.8–8.4)

## 2019-09-15 LAB — TSH: TSH: 2.42 u[IU]/mL (ref 0.450–4.500)

## 2019-09-20 ENCOUNTER — Encounter: Payer: Self-pay | Admitting: Family Medicine

## 2019-09-20 ENCOUNTER — Ambulatory Visit: Payer: Medicare Other | Admitting: Family Medicine

## 2019-09-20 ENCOUNTER — Other Ambulatory Visit: Payer: Self-pay

## 2019-09-20 VITALS — BP 118/60 | HR 56 | Ht 77.0 in | Wt 209.0 lb

## 2019-09-20 DIAGNOSIS — B0089 Other herpesviral infection: Secondary | ICD-10-CM | POA: Diagnosis not present

## 2019-09-20 DIAGNOSIS — G8929 Other chronic pain: Secondary | ICD-10-CM

## 2019-09-20 DIAGNOSIS — R7309 Other abnormal glucose: Secondary | ICD-10-CM

## 2019-09-20 DIAGNOSIS — K5732 Diverticulitis of large intestine without perforation or abscess without bleeding: Secondary | ICD-10-CM | POA: Diagnosis not present

## 2019-09-20 DIAGNOSIS — E7800 Pure hypercholesterolemia, unspecified: Secondary | ICD-10-CM | POA: Diagnosis not present

## 2019-09-20 DIAGNOSIS — Z0000 Encounter for general adult medical examination without abnormal findings: Secondary | ICD-10-CM | POA: Diagnosis not present

## 2019-09-20 DIAGNOSIS — M545 Low back pain, unspecified: Secondary | ICD-10-CM

## 2019-09-20 DIAGNOSIS — R739 Hyperglycemia, unspecified: Secondary | ICD-10-CM

## 2019-09-20 DIAGNOSIS — R002 Palpitations: Secondary | ICD-10-CM

## 2019-09-20 LAB — POCT GLYCOSYLATED HEMOGLOBIN (HGB A1C): HbA1c, POC (prediabetic range): 5.7 % (ref 5.7–6.4)

## 2019-09-20 MED ORDER — ROSUVASTATIN CALCIUM 10 MG PO TABS: 10.0000 mg | ORAL_TABLET | Freq: Every day | ORAL | 3 refills | Status: DC

## 2019-09-20 MED ORDER — TADALAFIL 20 MG PO TABS: 10.0000 mg | ORAL_TABLET | ORAL | 11 refills | Status: DC | PRN

## 2019-09-20 MED ORDER — ZOSTER VAC RECOMB ADJUVANTED 50 MCG/0.5ML IM SUSR: 0.5000 mL | Freq: Once | INTRAMUSCULAR | 0 refills | Status: AC

## 2019-09-20 MED ORDER — DIAZEPAM 5 MG PO TABS: 5.0000 mg | ORAL_TABLET | Freq: Two times a day (BID) | ORAL | 1 refills | Status: DC | PRN

## 2019-09-20 MED ORDER — ACYCLOVIR 800 MG PO TABS: 800.0000 mg | ORAL_TABLET | Freq: Two times a day (BID) | ORAL | 11 refills | Status: DC

## 2019-09-20 MED ORDER — AMOXICILLIN-POT CLAVULANATE 875-125 MG PO TABS: 1.0000 | ORAL_TABLET | Freq: Two times a day (BID) | ORAL | 3 refills | Status: DC | PRN

## 2019-09-20 NOTE — Patient Instructions (Addendum)
If you get your Shinrix vaccination, you are totally up to date. I will be interested to hear what Dr. Tamala Julian has to say. Remind me to check your Hgb A1C each year.  You may be slowly on your way to diabetes.

## 2019-09-20 NOTE — Assessment & Plan Note (Signed)
Very healthy male without any at risk behaviors.

## 2019-09-20 NOTE — Assessment & Plan Note (Signed)
Follow through with cards referral.

## 2019-09-20 NOTE — Assessment & Plan Note (Signed)
Continue prn antibiotics.

## 2019-09-20 NOTE — Assessment & Plan Note (Signed)
A1C is barely in the prediabetic range.  I am reluctant to label as such with this single elevated A1C.  Will follow.

## 2019-09-20 NOTE — Progress Notes (Signed)
    SUBJECTIVE:   CHIEF COMPLAINT / HPI:   Annual physical: Issues: 1. Had normal preexam blood work. 2. Hx of mildly elevated BS.  He bought home A1C test and result was 6.0 which would put him in prediabetic range.  Eats very healthy diet, active and at an ideal weight.   3. Has sensation of extra beats.  See last OV note.  Has an appointment with cardiolgy.  No CP or syncope.  No new symptoms.  Still feels that he gets tired easily.   4. Up to date on HPDP.  Has not yet had the Shingrix vaccination series. 5. Has one or two mild diverticulitis flairs per year.   PERTINENT  PMH / PSH: Family hx - both parents lived into late 85s/90s.  Denies CP, DOE, abd pain, syncope, bleeding, worrisome skin changes, headaches or focal weakness.  Denies change in bowel, bladder, appetite or weight.  OBJECTIVE:   BP 118/60   Pulse (!) 56   Ht 6\' 5"  (1.956 m)   Wt 209 lb (94.8 kg)   SpO2 100%   BMI 24.78 kg/m   Bradycardia again noted.   HEENT WNL Neck supple Lungs clear Cardiac RRR without m or g Abd benign.    ASSESSMENT/PLAN:   Routine general medical examination at a health care facility Very healthy male without any at risk behaviors.  Palpitations Follow through with cards referral.  Elevated blood sugar A1C is barely in the prediabetic range.  I am reluctant to label as such with this single elevated A1C.  Will follow.  Diverticulitis of colon Continue prn antibiotics.     Zenia Resides, MD [place]

## 2019-09-28 NOTE — Progress Notes (Signed)
Cardiology Office Note:    Date:  09/29/2019   ID:  Alan Gonzalez, DOB September 06, 1945, MRN [id number]  PCP:  Alan Resides, MD  Cardiologist:  No primary care provider on file.   Referring MD: Alan Resides, MD   Chief Complaint  Patient presents with  . Hypertension  . Advice Only    Family history of aortic dissection    History of Present Illness:    Alan Gonzalez is a 74 y.o. male with a hx of prediabetes, hyperlipidemia, echo documented diastolic dysfunction with low normal EF 47%, and family h/o hemachromatosis.  Recently seen by Dr Alan Gonzalez and concern about palpitations and and fatigue.  [name] has been experiencing nocturnal palpitations.  No racing or pounding heart.  He is not aware of any irregularity when he is awake, physically active, or exercising.  He denies chest pain.  He continues to workout on a nearly daily basis without change in exertional tolerance.  He otherwise sleeps well.  He has had no edema, orthopnea, PND, syncope, or claudication.  Vascular risk factors include prediabetes, hyperlipidemia, age, male sex, and erectile dysfunction.  Past Medical History:  Diagnosis Date  . Actinic keratosis 10/26/2008   Qualifier: Diagnosis of  By: Alan Frames MD, [place], NOS 09/18/2006   Qualifier: Diagnosis of  By: Alan Frames MD, Alan Gonzalez    . Diverticulitis   . Diverticulitis of colon 09/18/2006   Pattern is one to two flairs per year of diverticulitis which has been easily managed by early augmentin treatment.    . Elevated blood sugar 09/09/2014  . ERECTILE DYSFUNCTION 10/26/2008   Qualifier: Diagnosis of  By: Alan Frames MD, Alan Gonzalez    . Family history of hemochromatosis 09/09/2017   Father had symtomatic, lab proven hemochromatosis.  Likely needs genetic testing.    Marland Kitchen HYPERCHOLESTEROLEMIA 09/18/2006   Primary prevention: No known CAD, CVA, PVD or DM    . Hyperlipidemia   . Left leg cellulitis 08/21/2012  . Left ventricular diastolic  dysfunction with preserved systolic function [id number]   class I diastolic dysfunction by echo October 2016   . Palpitations 08/19/2019  . Peripheral neuropathy 09/09/2014  . Recurrent oral herpes simplex infection 09/09/2014  . Seborrheic keratoses 08/27/2013  . Thrombocytopenia ([place]) 07/26/2013   Has low normal platelet count and seems to have a qualitative platelet defect in that he bleeds very easily - previous normal work up.   . Uric acid kidney stone 03/13/2015   Stone analysis done 03/2015 Uric acid stones occur in 10% of all kidney stones and are the second most-common cause of urinary stones after calcium oxalate and calcium phosphate calculi. The most important risk factor for uric acid crystallization and stone formation is a low urine pH (below 5.5) rather than an increased urinary uric acid excretion. Main causes of low urine pH are tubular disorders  . Vitamin D deficiency disease 09/09/2014    Past Surgical History:  Procedure Laterality Date  . SPINE SURGERY     1990s    Current Medications: Current Meds  Medication Sig  . acyclovir (ZOVIRAX) 800 MG tablet Take 1 tablet (800 mg total) by mouth 2 (two) times daily.  Marland Kitchen amoxicillin-clavulanate (AUGMENTIN) 875-125 MG tablet Take 1 tablet by mouth 2 (two) times daily as needed (Diverticulitis).  . cholecalciferol (VITAMIN D) 1000 units tablet Take 1,000 Units daily by mouth.  . diazepam (VALIUM) 5 MG tablet Take 1 tablet (5 mg total) by mouth every 12 (twelve)  hours as needed for muscle spasms.  . rosuvastatin (CRESTOR) 10 MG tablet Take 1 tablet (10 mg total) by mouth daily.  . tadalafil (CIALIS) 20 MG tablet Take 0.5-1 tablets (10-20 mg total) by mouth every other day as needed for erectile dysfunction.     Allergies:   Patient has no known allergies.   Social History   Socioeconomic History  . Marital status: Married    Spouse name: Alan Gonzalez  . Number of children: 3  . Years of education: PHD  . Highest education level: Not  on file  Occupational History  . Occupation: PHD-physcologist     Employer: [employer]  Tobacco Use  . Smoking status: Never Smoker  . Smokeless tobacco: Never Used  Substance and Sexual Activity  . Alcohol use: Yes    Alcohol/week: 7.0 standard drinks    Types: 7 Glasses of wine per week    Comment: one glass of wine per night-medicinal reasons  . Drug use: No  . Sexual activity: Yes    Comment: monagomous  Other Topics Concern  . Not on file  Social History Narrative   Emergency Contact: wife, Alan Gonzalez [phone]   Diet: Pt has a varied diet of protein, starch, and vegetables/fruits   Seatbelts: Pt reports wearing seatbelt when in vehicles.    Sun Exposure/Protection: Pt reports wearing ball cap, long sleeve   Hobbies: golf, exercise, outdoors, reading      Patient lives with wife Alan Gonzalez) in two level home 06/05/2017   Transportation: Patient has own vehicle and drives himself [id number]   Important Relationships Spouse, kids, grandkids, friends 06/05/2017    Pets: None 06/05/2017   Education / Work:  PhD/ Psychologist 06/05/2017   Interests / Fun: Golf, travel, keep busy 06/05/2017   Current Stressors: Minor life hassles 06/05/2017   Religious / Personal Beliefs: Catholic Q000111Q                                                                                                   Social Determinants of Health   Financial Resource Strain: Low Risk   . Difficulty of Paying Living Expenses: Not hard at all  Food Insecurity: No Food Insecurity  . Worried About Charity fundraiser in the Last Year: Never true  . Ran Out of Food in the Last Year: Never true  Transportation Needs: No Transportation Needs  . Lack of Transportation (Medical): No  . Lack of Transportation (Non-Medical): No  Physical Activity: Sufficiently Active  . Days of Exercise per Week: 4 days  . Minutes of Exercise per Session: 70 min  Stress: No Stress Concern Present  . Feeling of Stress :  Only a little  Social Connections: Slightly Isolated  . Frequency of Communication with Friends and Family: Three times a week  . Frequency of Social Gatherings with Friends and Family: More than three times a week  . Attends Religious Services: 1 to 4 times per year  . Active Member of Clubs or Organizations: No  . Attends Archivist Meetings: Never  . Marital Status: Married  Family History: The patient's family history includes COPD in his father; Cancer in his mother and sister; Heart disease in his brother and sister; Thyroid disease in his sister.  ROS:   Please see the history of present illness.    He denies neurological symptoms.  At one episode of severe dizziness after a particularly vigorous session of exercise with a trainer.  All other systems reviewed and are negative.  EKGs/Labs/Other Studies Reviewed:    The following studies were reviewed today:  Nuclear scintigraphy was low risk in 2016.  Echocardiogram [facility] [place] demonstrated EF of 55 to 60%.  There was trivial mitral regurgitation at the time.  EKG:  EKG good sinus bradycardia with poor R wave progression V1 through V3, actual QS pattern.  When compared to prior tracing from 2016, no change has occurred.  Recent Labs: 09/14/2019: ALT 11; BUN 16; Creatinine, Ser 1.08; Hemoglobin 15.3; Platelets 161; Potassium 4.6; Sodium 142; TSH 2.420  Recent Lipid Panel    Component Value Date/Time   CHOL 165 09/14/2019 0907   TRIG 102 09/14/2019 0907   HDL 54 09/14/2019 0907   CHOLHDL 3.1 09/14/2019 0907   CHOLHDL 2.9 09/17/2016 0858   VLDL 13 09/17/2016 0858   LDLCALC 92 09/14/2019 0907   LDLDIRECT 105 05/03/2015 1054    Physical Exam:    VS:  BP 124/76   Pulse 63   Ht 6\' 5"  (1.956 m)   Wt 209 lb (94.8 kg)   SpO2 98%   BMI 24.78 kg/m     Wt Readings from Last 3 Encounters:  09/29/19 209 lb (94.8 kg)  09/20/19 209 lb (94.8 kg)  08/19/19 209 lb (94.8 kg)     GEN: Healthy-appearing. No acute  distress HEENT: Normal NECK: No JVD. LYMPHATICS: No lymphadenopathy CARDIAC: Soft left parasternal 1/6 to 2/6 systolic murmur.  RRR without diastolic murmur, gallop, or edema. VASCULAR:  Normal Pulses. No bruits. RESPIRATORY:  Clear to auscultation without rales, wheezing or rhonchi  ABDOMEN: Soft, non-tender, non-distended, No pulsatile mass, MUSCULOSKELETAL: No deformity  SKIN: Warm and dry NEUROLOGIC:  Alert and oriented x 3 PSYCHIATRIC:  Normal affect   ASSESSMENT:    1. Palpitations   2. Atherosclerosis   3. Prediabetes   4. Hyperlipidemia LDL goal <70   5. Educated about COVID-19 virus infection    PLAN:    In order of problems listed above:  1. Symptoms seem to suggest premature atrial or ventricular beats.  A 30-day monitor will be done to fully evaluate prevalence, etiology, and exclude surprises such as atrial fibrillation or ventricular ectopy of high burden. 2. Has risk factors for atherosclerosis including hyperlipidemia, family history (aortic aneurysm/dissection), and prediabetes.  A coronary calcium score is desired further enhance risk assessment. 3. Continue exactly what he is doing, which is significant aerobic activity.  Most recent A1c was 5.7. 4. Perhaps target LDL should be less than 70.  He is on low intensity statin therapy.  Coronary calcium score will help further specify level of risk. 5. Vaccination has been received.  3 Debbie's discussed.  Overall education and awareness concerning primary risk prevention was discussed in detail: LDL less than 70, hemoglobin A1c less than 7, blood pressure target less than 130/80 mmHg, >150 minutes of moderate aerobic activity per week, avoidance of smoking, weight control (via diet and exercise), and continued surveillance/management of/for obstructive sleep apnea.   Medication Adjustments/Labs and Tests Ordered: Current medicines are reviewed at length with the patient today.  Concerns regarding medicines are  outlined above.  Orders Placed This Encounter  Procedures  . CT CARDIAC SCORING  . Cardiac event monitor   No orders of the defined types were placed in this encounter.   Patient Instructions  Medication Instructions:  Your physician recommends that you continue on your current medications as directed. Please refer to the Current Medication list given to you today.  *If you need a refill on your cardiac medications before your next appointment, please call your pharmacy*   Lab Work: None If you have labs (blood work) drawn today and your tests are completely normal, you will receive your results only by: Marland Kitchen MyChart Message (if you have MyChart) OR . A paper copy in the mail If you have any lab test that is abnormal or we need to change your treatment, we will call you to review the results.   Testing/Procedures: Your physician has recommended that you wear an event monitor. Event monitors are medical devices that record the heart's electrical activity. Doctors most often [place] these monitors to diagnose arrhythmias. Arrhythmias are problems with the speed or rhythm of the heartbeat. The monitor is a small, portable device. You can wear one while you do your normal daily activities. This is usually used to diagnose what is causing palpitations/syncope (passing out).  Your physician recommends that you have a Calcium Score performed.    Follow-Up: At [facility], you and your health needs are our priority.  As part of our continuing mission to provide you with exceptional heart care, we have created designated Provider Care Teams.  These Care Teams include your primary Cardiologist (physician) and Advanced Practice Providers (APPs -  Physician Assistants and Nurse Practitioners) who all work together to provide you with the care you need, when you need it.  We recommend signing up for the patient portal called "MyChart".  Sign up information is provided on this After Visit Summary.   MyChart is used to connect with patients for Virtual Visits (Telemedicine).  Patients are able to view lab/test results, encounter notes, upcoming appointments, etc.  Non-urgent messages can be sent to your provider as well.   To learn more about what you can do with MyChart, go to [email or web address].    Your next appointment:   As needed  The format for your next appointment:   In Person  Provider:   You may see Dr. Daneen Schick or one of the following Advanced Practice Providers on your designated Care Team:    Truitt Merle, NP  Cecilie Kicks, NP  Kathyrn Drown, NP    Other Instructions      Signed, Sinclair Grooms, MD  09/29/2019 11:50 AM    [place]

## 2019-09-29 ENCOUNTER — Ambulatory Visit: Payer: Medicare Other | Admitting: Interventional Cardiology

## 2019-09-29 ENCOUNTER — Other Ambulatory Visit: Payer: Self-pay

## 2019-09-29 ENCOUNTER — Encounter: Payer: Self-pay | Admitting: Interventional Cardiology

## 2019-09-29 ENCOUNTER — Encounter: Payer: Self-pay | Admitting: *Deleted

## 2019-09-29 VITALS — BP 124/76 | HR 63 | Ht 77.0 in | Wt 209.0 lb

## 2019-09-29 DIAGNOSIS — R002 Palpitations: Secondary | ICD-10-CM | POA: Diagnosis not present

## 2019-09-29 DIAGNOSIS — I7090 Unspecified atherosclerosis: Secondary | ICD-10-CM

## 2019-09-29 DIAGNOSIS — R7303 Prediabetes: Secondary | ICD-10-CM | POA: Diagnosis not present

## 2019-09-29 DIAGNOSIS — E785 Hyperlipidemia, unspecified: Secondary | ICD-10-CM | POA: Diagnosis not present

## 2019-09-29 DIAGNOSIS — Z7189 Other specified counseling: Secondary | ICD-10-CM

## 2019-09-29 NOTE — Progress Notes (Signed)
Patient ID: Alan Gonzalez, male   DOB: Mar 20, 1946, 74 y.o.   MRN: [id number] Patient given [employer] 30 day cardiac event monitor from office inventory.  Patient also given printout of Cardiac event monitor instructions.

## 2019-09-29 NOTE — Patient Instructions (Signed)
Medication Instructions:  Your physician recommends that you continue on your current medications as directed. Please refer to the Current Medication list given to you today.  *If you need a refill on your cardiac medications before your next appointment, please call your pharmacy*   Lab Work: None If you have labs (blood work) drawn today and your tests are completely normal, you will receive your results only by: Marland Kitchen MyChart Message (if you have MyChart) OR . A paper copy in the mail If you have any lab test that is abnormal or we need to change your treatment, we will call you to review the results.   Testing/Procedures: Your physician has recommended that you wear an event monitor. Event monitors are medical devices that record the heart's electrical activity. Doctors most often [place] these monitors to diagnose arrhythmias. Arrhythmias are problems with the speed or rhythm of the heartbeat. The monitor is a small, portable device. You can wear one while you do your normal daily activities. This is usually used to diagnose what is causing palpitations/syncope (passing out).  Your physician recommends that you have a Calcium Score performed.    Follow-Up: At [facility], you and your health needs are our priority.  As part of our continuing mission to provide you with exceptional heart care, we have created designated Provider Care Teams.  These Care Teams include your primary Cardiologist (physician) and Advanced Practice Providers (APPs -  Physician Assistants and Nurse Practitioners) who all work together to provide you with the care you need, when you need it.  We recommend signing up for the patient portal called "MyChart".  Sign up information is provided on this After Visit Summary.  MyChart is used to connect with patients for Virtual Visits (Telemedicine).  Patients are able to view lab/test results, encounter notes, upcoming appointments, etc.  Non-urgent messages can be sent to your  provider as well.   To learn more about what you can do with MyChart, go to [email or web address].    Your next appointment:   As needed  The format for your next appointment:   In Person  Provider:   You may see Dr. Daneen Schick or one of the following Advanced Practice Providers on your designated Care Team:    Truitt Merle, NP  Cecilie Kicks, NP  Kathyrn Drown, NP    Other Instructions

## 2019-10-03 ENCOUNTER — Ambulatory Visit (INDEPENDENT_AMBULATORY_CARE_PROVIDER_SITE_OTHER): Payer: Medicare Other

## 2019-10-03 DIAGNOSIS — R002 Palpitations: Secondary | ICD-10-CM

## 2019-10-11 DIAGNOSIS — Z0120 Encounter for dental examination and cleaning without abnormal findings: Secondary | ICD-10-CM | POA: Diagnosis not present

## 2019-10-19 ENCOUNTER — Other Ambulatory Visit: Payer: Self-pay

## 2019-10-19 ENCOUNTER — Ambulatory Visit (INDEPENDENT_AMBULATORY_CARE_PROVIDER_SITE_OTHER)
Admission: RE | Admit: 2019-10-19 | Discharge: 2019-10-19 | Disposition: A | Discharge status: Home / Self Care | Payer: Self-pay | Source: Ambulatory Visit | Attending: Interventional Cardiology | Admitting: Interventional Cardiology

## 2019-10-19 DIAGNOSIS — I7090 Unspecified atherosclerosis: Secondary | ICD-10-CM

## 2019-10-25 ENCOUNTER — Telehealth: Payer: Self-pay | Admitting: *Deleted

## 2019-10-25 DIAGNOSIS — E785 Hyperlipidemia, unspecified: Secondary | ICD-10-CM

## 2019-10-25 MED ORDER — ROSUVASTATIN CALCIUM 20 MG PO TABS: 20.0000 mg | ORAL_TABLET | Freq: Every day | ORAL | 3 refills | Status: DC

## 2019-10-25 NOTE — Telephone Encounter (Signed)
Spoke with pt and reviewed results and recommendations.  Pt will come for labs on 6/8.  Pt verbalized understanding and was in agreement with this plan.

## 2019-10-25 NOTE — Telephone Encounter (Signed)
-----   Message from Belva Crome, MD sent at 10/23/2019 12:27 PM EDT ----- Let the patient know the coronary calcium score is low (< 100). However, there is plaque. May be appropriate to increase the Rosuvastatin to 20 mg daily and repeat liver and lipid in 2 months. Still awaiting the monitor. A copy will be sent to Zenia Resides, MD

## 2019-10-30 ENCOUNTER — Telehealth: Payer: Self-pay | Admitting: Cardiology

## 2019-10-30 NOTE — Telephone Encounter (Signed)
Received call from [employer] regarding one episode of NSVT of 10 beats. Underlying NSR at 60bpm auto-detected. Pt asymptomatic. Will notify primary cardiologist. Lab work stable from 09/14/19.    Kathyrn Drown NP-C [place] Pager: [phone]

## 2019-11-01 ENCOUNTER — Telehealth: Payer: Self-pay

## 2019-11-01 NOTE — Telephone Encounter (Signed)
Called the pt re: his 10 beat run of NSVT that he had on 10/30/19 at approx 9:30 and and he reports that he has been feeling well and still having "some palpitations at night as he had when he saw Dr. Tamala Julian 09/2019.. he denies dizziness, sob. He has finished his 30 days with the monitor and just mailed it back this morning.   I advise him I will forward his monitor to Dr. Tamala Julian and will call him after his review and recommendations.

## 2019-11-29 ENCOUNTER — Telehealth: Payer: Self-pay | Admitting: Interventional Cardiology

## 2019-11-29 DIAGNOSIS — I493 Ventricular premature depolarization: Secondary | ICD-10-CM

## 2019-11-29 NOTE — Telephone Encounter (Signed)
Patient returning Jennifer's call in regards to monitor results.

## 2019-11-29 NOTE — Telephone Encounter (Signed)
Spoke with pt and reviewed monitor results and recommendations.  Pt in agreement with EP referral.  Referral placed and made pt aware scheduler will contact him with appt.

## 2019-12-20 ENCOUNTER — Other Ambulatory Visit: Payer: Self-pay | Admitting: Family Medicine

## 2019-12-21 MED ORDER — ROSUVASTATIN CALCIUM 20 MG PO TABS: 20.0000 mg | ORAL_TABLET | Freq: Every day | ORAL | 0 refills | Status: DC

## 2019-12-28 ENCOUNTER — Other Ambulatory Visit: Payer: Self-pay

## 2019-12-28 ENCOUNTER — Encounter: Payer: Self-pay | Admitting: Internal Medicine

## 2019-12-28 ENCOUNTER — Ambulatory Visit: Payer: Medicare Other | Admitting: Internal Medicine

## 2019-12-28 ENCOUNTER — Other Ambulatory Visit: Payer: Medicare Other

## 2019-12-28 VITALS — BP 136/78 | HR 62 | Ht 77.0 in | Wt 208.0 lb

## 2019-12-28 DIAGNOSIS — E785 Hyperlipidemia, unspecified: Secondary | ICD-10-CM | POA: Diagnosis not present

## 2019-12-28 DIAGNOSIS — R002 Palpitations: Secondary | ICD-10-CM

## 2019-12-28 LAB — HEPATIC FUNCTION PANEL
ALT: 15 IU/L (ref 0–44)
AST: 23 IU/L (ref 0–40)
Albumin: 4.5 g/dL (ref 3.7–4.7)
Alkaline Phosphatase: 87 IU/L (ref 48–121)
Bilirubin Total: 0.7 mg/dL (ref 0.0–1.2)
Bilirubin, Direct: 0.2 mg/dL (ref 0.00–0.40)
Total Protein: 6.9 g/dL (ref 6.0–8.5)

## 2019-12-28 LAB — LIPID PANEL
Chol/HDL Ratio: 2.8 ratio (ref 0.0–5.0)
Cholesterol, Total: 155 mg/dL (ref 100–199)
HDL: 55 mg/dL (ref >39)
LDL Chol Calc (NIH): 85 mg/dL (ref 0–99)
Triglycerides: 77 mg/dL (ref 0–149)
VLDL Cholesterol Cal: 15 mg/dL (ref 5–40)

## 2019-12-28 NOTE — Progress Notes (Signed)
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 Tamala Julian for evaluation of PVC's and palpitations. He is a pleasant 74 yo man with a h/o dyslipidemia who has had occaisional palpitations. He initially saw Dr. Tamala Julian for an abnormal ECG for which he was told was ok. He had an echo 5 years ago with normal LV function. He had a cardiac monitor which demonstrates 6% PVC's and occaisional NSVT. He has minimal palpitations and is not anxious about the PVC"s. He is able to exercise very well with no limitation. He walks up to 4 mph at up to a 12% grade. He notes that after a strenuous day he will feel fatigued. Compared to 10 years ago, he does not think his endurance is quite as good as it once was.  No Known Allergies   Current Outpatient Medications  Medication Sig Dispense Refill  . acyclovir (ZOVIRAX) 800 MG tablet Take 1 tablet (800 mg total) by mouth 2 (two) times daily. 20 tablet 11  . amoxicillin-clavulanate (AUGMENTIN) 875-125 MG tablet Take 1 tablet by mouth 2 (two) times daily as needed (Diverticulitis). 20 tablet 3  . cholecalciferol (VITAMIN D) 1000 units tablet Take 1,000 Units daily by mouth.    . diazepam (VALIUM) 5 MG tablet Take 1 tablet (5 mg total) by mouth every 12 (twelve) hours as needed for muscle spasms. 30 tablet 1  . rosuvastatin (CRESTOR) 20 MG tablet Take 1 tablet (20 mg total) by mouth daily. 90 tablet 0  . tadalafil (CIALIS) 20 MG tablet Take 0.5-1 tablets (10-20 mg total) by mouth every other day as needed for erectile dysfunction. 5 tablet 11   No current facility-administered medications for this visit.     Past Medical History:  Diagnosis Date  . Actinic keratosis 10/26/2008   Qualifier: Diagnosis of  By: Andria Frames MD, [place], NOS 09/18/2006   Qualifier: Diagnosis of  By: Andria Frames MD, [name]    . Diverticulitis   . Diverticulitis of colon 09/18/2006   Pattern is one to two flairs per year of diverticulitis which has been easily managed by  early augmentin treatment.    . Elevated blood sugar 09/09/2014  . ERECTILE DYSFUNCTION 10/26/2008   Qualifier: Diagnosis of  By: Andria Frames MD, [name]    . Family history of hemochromatosis 09/09/2017   Father had symtomatic, lab proven hemochromatosis.  Likely needs genetic testing.    Marland Kitchen HYPERCHOLESTEROLEMIA 09/18/2006   Primary prevention: No known CAD, CVA, PVD or DM    . Hyperlipidemia   . Left leg cellulitis 08/21/2012  . Left ventricular diastolic dysfunction with preserved systolic function 19/41/7408   class I diastolic dysfunction by echo October 2016   . Palpitations 08/19/2019  . Peripheral neuropathy 09/09/2014  . Recurrent oral herpes simplex infection 09/09/2014  . Seborrheic keratoses 08/27/2013  . Thrombocytopenia ([place]) 07/26/2013   Has low normal platelet count and seems to have a qualitative platelet defect in that he bleeds very easily - previous normal work up.   . Uric acid kidney stone 03/13/2015   Stone analysis done 03/2015 Uric acid stones occur in 10% of all kidney stones and are the second most-common cause of urinary stones after calcium oxalate and calcium phosphate calculi. The most important risk factor for uric acid crystallization and stone formation is a low urine pH (below 5.5) rather than an increased urinary uric acid excretion. Main causes of low urine pH are tubular disorders  . Vitamin D deficiency  disease 09/09/2014    ROS:   All systems reviewed and negative except as noted in the HPI.   Past Surgical History:  Procedure Laterality Date  . SPINE SURGERY     1990s     Family History  Problem Relation Age of Onset  . Cancer Mother        Vulvar  . COPD Father   . Cancer Sister        breast  . Heart disease Sister   . Heart disease Brother   . Thyroid disease Sister      Social History   Socioeconomic History  . Marital status: Married    Spouse name: [name]  . Number of children: 3  . Years of education: PHD  . Highest education level: Not  on file  Occupational History  . Occupation: PHD-physcologist     Employer: [employer]  Tobacco Use  . Smoking status: Never Smoker  . Smokeless tobacco: Never Used  Substance and Sexual Activity  . Alcohol use: Yes    Alcohol/week: 7.0 standard drinks    Types: 7 Glasses of wine per week    Comment: one glass of wine per night-medicinal reasons  . Drug use: No  . Sexual activity: Yes    Comment: monagomous  Other Topics Concern  . Not on file  Social History Narrative   Emergency Contact: wife, [name] [phone]   Diet: Pt has a varied diet of protein, starch, and vegetables/fruits   Seatbelts: Pt reports wearing seatbelt when in vehicles.    Sun Exposure/Protection: Pt reports wearing ball cap, long sleeve   Hobbies: golf, exercise, outdoors, reading      Patient lives with wife [name]) in two level home 06/05/2017   Transportation: Patient has own vehicle and drives himself 09/98/3382   Important Relationships Spouse, kids, grandkids, friends 06/05/2017    Pets: None 06/05/2017   Education / Work:  PhD/ Psychologist 06/05/2017   Interests / Fun: Golf, travel, keep busy 06/05/2017   Current Stressors: Minor life hassles 06/05/2017   Religious / Personal Beliefs: Catholic 50/53/9767                                                                                                   Social Determinants of Health   Financial Resource Strain: Low Risk   . Difficulty of Paying Living Expenses: Not hard at all  Food Insecurity: No Food Insecurity  . Worried About Charity fundraiser in the Last Year: Never true  . Ran Out of Food in the Last Year: Never true  Transportation Needs: No Transportation Needs  . Lack of Transportation (Medical): No  . Lack of Transportation (Non-Medical): No  Physical Activity: Sufficiently Active  . Days of Exercise per Week: 4 days  . Minutes of Exercise per Session: 70 min  Stress: No Stress Concern Present  . Feeling of Stress :  Only a little  Social Connections: Slightly Isolated  . Frequency of Communication with Friends and Family: Three times a week  . Frequency of Social Gatherings with Friends and Family:  More than three times a week  . Attends Religious Services: 1 to 4 times per year  . Active Member of Clubs or Organizations: No  . Attends Archivist Meetings: Never  . Marital Status: Married  Human resources officer Violence: Not At Risk  . Fear of Current or Ex-Partner: No  . Emotionally Abused: No  . Physically Abused: No  . Sexually Abused: No     BP 136/78   Pulse 62   Ht 6\' 5"  (1.956 m)   Wt 208 lb (94.3 kg)   SpO2 97%   BMI 24.67 kg/m   Physical Exam:  Well appearing NAD HEENT: Unremarkable Neck:  No JVD, no thyromegally Lymphatics:  No adenopathy Back:  No CVA tenderness Lungs:  Clear with no wheezes HEART:  Regular rate rhythm, no murmurs, no rubs, no clicks Abd:  soft, positive bowel sounds, no organomegally, no rebound, no guarding Ext:  2 plus pulses, no edema, no cyanosis, no clubbing Skin:  No rashes no nodules Neuro:  CN II through XII intact, motor grossly intact  Assess/Plan: 1. PVC's - he is minimally symptomatic and his burden would not suggest any worrisome issues. I have asked him to undergo a 2D echo to assure [place] that his EF has not gone down. I would not recommend a beta blocker at this point. Not enough symptoms.   Mikle Bosworth.D.

## 2019-12-28 NOTE — Patient Instructions (Addendum)
Medication Instructions:  Your physician recommends that you continue on your current medications as directed. Please refer to the Current Medication list given to you today.  Labwork: None ordered.  Testing/Procedures: Your physician has requested that you have an echocardiogram. Echocardiography is a painless test that uses sound waves to create images of your heart. It provides your doctor with information about the size and shape of your heart and how well your heart's chambers and valves are working. This procedure takes approximately one hour. There are no restrictions for this procedure.  Please schedule for ECHO  Follow-Up: Your physician wants you to follow-up based on results of your ECHO.   Any Other Special Instructions Will Be Listed Below (If Applicable).  If you need a refill on your cardiac medications before your next appointment, please call your pharmacy.

## 2020-01-17 ENCOUNTER — Other Ambulatory Visit: Payer: Self-pay

## 2020-01-17 ENCOUNTER — Ambulatory Visit (HOSPITAL_COMMUNITY): Payer: Medicare Other | Attending: Cardiovascular Disease

## 2020-01-17 DIAGNOSIS — I080 Rheumatic disorders of both mitral and aortic valves: Secondary | ICD-10-CM | POA: Insufficient documentation

## 2020-01-17 DIAGNOSIS — R9431 Abnormal electrocardiogram [ECG] [EKG]: Secondary | ICD-10-CM | POA: Insufficient documentation

## 2020-01-17 DIAGNOSIS — R002 Palpitations: Secondary | ICD-10-CM | POA: Diagnosis not present

## 2020-01-17 DIAGNOSIS — Z8249 Family history of ischemic heart disease and other diseases of the circulatory system: Secondary | ICD-10-CM | POA: Diagnosis not present

## 2020-01-17 DIAGNOSIS — E785 Hyperlipidemia, unspecified: Secondary | ICD-10-CM | POA: Insufficient documentation

## 2020-02-24 DIAGNOSIS — Z20822 Contact with and (suspected) exposure to covid-19: Secondary | ICD-10-CM | POA: Diagnosis not present

## 2020-03-23 ENCOUNTER — Ambulatory Visit (INDEPENDENT_AMBULATORY_CARE_PROVIDER_SITE_OTHER): Payer: Medicare Other

## 2020-03-23 VITALS — Ht 77.0 in | Wt 202.0 lb

## 2020-03-23 DIAGNOSIS — H02052 Trichiasis without entropian right lower eyelid: Secondary | ICD-10-CM | POA: Diagnosis not present

## 2020-03-23 DIAGNOSIS — H04123 Dry eye syndrome of bilateral lacrimal glands: Secondary | ICD-10-CM | POA: Diagnosis not present

## 2020-03-23 DIAGNOSIS — H2511 Age-related nuclear cataract, right eye: Secondary | ICD-10-CM | POA: Diagnosis not present

## 2020-03-23 DIAGNOSIS — Z0000 Encounter for general adult medical examination without abnormal findings: Secondary | ICD-10-CM

## 2020-03-23 DIAGNOSIS — H25812 Combined forms of age-related cataract, left eye: Secondary | ICD-10-CM | POA: Diagnosis not present

## 2020-03-23 NOTE — Progress Notes (Signed)
Subjective:   Alan Lister, Alan Gonzalez is a 74 y.o. male who presents for Medicare Annual/Subsequent preventive examination.  The patient consented to a virtual visit. Patient consented to have virtual visit and was identified by name and date of birth. Method of visit: Telephone Encounter participants: Patient: Alan Lister, Alan Gonzalez - located at home Nurse/Provider: Dorna Bloom - [facility] Others (if applicable): none  Review of Systems: Defer to PCP.   Cardiac Risk Factors include: advanced age (>84men, >20 women)  Objective:    Vitals: Ht 6\' 5"  (1.956 m)    Wt 202 lb (91.6 kg)    BMI 23.95 kg/m   Body mass index is 23.95 kg/m.  Advanced Directives 03/23/2020 05/10/2019 09/09/2017 06/05/2017 09/05/2016 08/31/2015 05/03/2015  Does Patient Have a Medical Advance Directive? Yes Yes No Yes Yes Yes Yes  Type of Advance Directive Living will;Healthcare Power of [place];Living will - [place];Living will [place];Living will - -  Does patient want to make changes to medical advance directive? No - Patient declined - - No - Patient declined - - -  Copy of [place] in Chart? No - copy requested No - copy requested - No - copy requested No - copy requested Yes Yes  Would patient like information on creating a medical advance directive? - - No - Patient declined - No - Patient declined - -   Tobacco Social History   Tobacco Use  Smoking Status Never Smoker  Smokeless Tobacco Never Used     Clinical Intake:  Pre-visit preparation completed: Yes  Pain Score: 0-No pain  How often do you need to have someone help you when you read instructions, pamphlets, or other written materials from your doctor or pharmacy?: 1 - Never What is the last grade level you completed in school?: Alan Gonzalez  Interpreter Needed?: No  Past Medical History:  Diagnosis Date   Actinic keratosis 10/26/2008   Qualifier:  Diagnosis of  By: Andria Frames MD, [place], NOS 09/18/2006   Qualifier: Diagnosis of  By: Andria Frames MD, [place]     Diverticulitis    Diverticulitis of colon 09/18/2006   Pattern is one to two flairs per year of diverticulitis which has been easily managed by early augmentin treatment.     Elevated blood sugar 09/09/2014   ERECTILE DYSFUNCTION 10/26/2008   Qualifier: Diagnosis of  By: Andria Frames MD, Alan Gonzalez     Family history of hemochromatosis 09/09/2017   Father had symtomatic, lab proven hemochromatosis.  Likely needs genetic testing.     HYPERCHOLESTEROLEMIA 09/18/2006   Primary prevention: No known CAD, CVA, PVD or DM     Hyperlipidemia    Left leg cellulitis 08/21/2012   Left ventricular diastolic dysfunction with preserved systolic function 87/56/4332   class I diastolic dysfunction by echo October 2016    Palpitations 08/19/2019   Peripheral neuropathy 09/09/2014   Recurrent oral herpes simplex infection 09/09/2014   Seborrheic keratoses 08/27/2013   Thrombocytopenia ([place]) 07/26/2013   Has low normal platelet count and seems to have a qualitative platelet defect in that he bleeds very easily - previous normal work up.    Uric acid kidney stone 03/13/2015   Stone analysis done 03/2015 Uric acid stones occur in 10% of all kidney stones and are the second most-common cause of urinary stones after calcium oxalate and calcium phosphate calculi. The most important risk factor for uric acid crystallization and  stone formation is a low urine pH (below 5.5) rather than an increased urinary uric acid excretion. Main causes of low urine pH are tubular disorders   Vitamin D deficiency disease 09/09/2014   Past Surgical History:  Procedure Laterality Date   SPINE SURGERY     1990s   Family History  Problem Relation Age of Onset   Cancer Mother        Vulvar   COPD Father    Cancer Sister        breast   Heart disease Sister    Heart disease Brother    Thyroid  disease Sister    Social History   Socioeconomic History   Marital status: Married    Spouse name: Alan Gonzalez   Number of children: 3   Years of education: Alan Gonzalez   Highest education level: Not on file  Occupational History   Occupation: Alan Gonzalez-physcologist     Employer: [employer]  Tobacco Use   Smoking status: Never Smoker   Smokeless tobacco: Never Used  Vaping Use   Vaping Use: Never used  Substance and Sexual Activity   Alcohol use: Yes    Alcohol/week: 7.0 standard drinks    Types: 7 Glasses of wine per week    Comment: one glass of wine per night-medicinal reasons   Drug use: No   Sexual activity: Yes    Comment: monagomous  Other Topics Concern   Not on file  Social History Narrative   Emergency Contact: wife, Alan Gonzalez [phone]   Diet: Pt has a varied diet of protein, starch, and vegetables/fruits   Seatbelts: Pt reports wearing seatbelt when in vehicles.    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   Interests / Fun: Golf, travel, keep busy 03/23/2020   Current Stressors: Minor life hassles 03/23/2020   Religious / Personal Beliefs: Catholic 06/00/4599                                                                                                   Social Determinants of Health   Financial Resource Strain: Low Risk    Difficulty of Paying Living Expenses: Not hard at all  Food Insecurity: No Food Insecurity   Worried About Charity fundraiser in the Last Year: Never true   [place] in the Last Year: Never true  Transportation Needs: No Transportation Needs   Lack of Transportation  (Medical): No   Lack of Transportation (Non-Medical): No  Physical Activity: Sufficiently Active   Days of Exercise per Week: 4 days   Minutes of Exercise per Session: 60 min  Stress: No Stress Concern Present   Feeling of Stress : Only a little  Social Connections: Moderately Integrated   Frequency  of Communication with Friends and Family: Three times a week   Frequency of Social Gatherings with Friends and Family: More than three times a week   Attends Religious Services: More than 4 times per year   Active Member of Clubs or Organizations: No   Attends Archivist Meetings: Never   Marital Status: Married   Outpatient Encounter Medications as of 03/23/2020  Medication Sig   acyclovir (ZOVIRAX) 800 MG tablet Take 1 tablet (800 mg total) by mouth 2 (two) times daily.   amoxicillin-clavulanate (AUGMENTIN) 875-125 MG tablet Take 1 tablet by mouth 2 (two) times daily as needed (Diverticulitis).   cholecalciferol (VITAMIN D) 1000 units tablet Take 1,000 Units daily by mouth.   diazepam (VALIUM) 5 MG tablet Take 1 tablet (5 mg total) by mouth every 12 (twelve) hours as needed for muscle spasms.   tadalafil (CIALIS) 20 MG tablet Take 0.5-1 tablets (10-20 mg total) by mouth every other day as needed for erectile dysfunction.   [DISCONTINUED] rosuvastatin (CRESTOR) 20 MG tablet Take 1 tablet (20 mg total) by mouth daily.   No facility-administered encounter medications on file as of 03/23/2020.   Activities of Daily Living In your present state of health, do you have any difficulty performing the following activities: 03/23/2020 05/10/2019  Hearing? N N  Vision? N N  Difficulty concentrating or making decisions? N N  Walking or climbing stairs? N N  Dressing or bathing? N N  Doing errands, shopping? N N  Preparing Food and eating ? N N  Using the Toilet? N N  In the past six months, have you accidently leaked urine? - N  Do you have problems with loss of bowel  control? - N  Managing your Medications? N N  Managing your Finances? N N  Housekeeping or managing your Housekeeping? N N  Some recent data might be hidden   Patient Care Team: Zenia Resides, MD as PCP - Evalina Field, MD (Ophthalmology) Mariana Arn (Dentistry)    Assessment:   This is a routine wellness examination for Alan Gonzalez.  Exercise Activities and Dietary recommendations Current Exercise Habits: Home exercise routine, Type of exercise: strength training/weights;treadmill, Time (Minutes): > 60, Frequency (Times/Week): 4, Weekly Exercise (Minutes/Week): 0, Intensity: Moderate, Exercise limited by: None identified  Goals      Maintain current level of physical activities (pt-stated)      Maintain current level of physical activity (pt-stated)      Patient Stated      Continue to maintain current level of physical activity       Fall Risk Fall Risk  03/23/2020 08/19/2019 05/10/2019 09/10/2018 09/09/2017  Falls in the past year? 0 0 0 0 No  Number falls in past yr: - - - - -  Injury with Fall? - - - - -   Is the patient's home free of loose throw rugs in walkways, pet beds, electrical cords, etc?   yes      Grab bars in the bathroom? yes      Handrails on the stairs?   yes      Adequate lighting?   yes  Patient rating of health (0-10): 10  Depression Screen PHQ 2/9 Scores 03/31/2020 03/23/2020 09/20/2019 08/19/2019  PHQ - 2 Score 0 0 0 0   Cognitive Function MMSE - Mini Mental State Exam 08/23/2013 04/23/2012  Orientation to time 5 5  Orientation to Place 5 5  Registration 3 3  Attention/ Calculation 5 5  Recall 3  3  Language- name 2 objects 2 2  Language- repeat 1 1  Language- follow 3 step command 3 3  Language- read & follow direction 1 1  Write a sentence 1 1  Copy design 1 1  Total score 30 30   6CIT Screen 03/23/2020 05/10/2019  What Year? 0 points 0 points  What month? 0 points 0 points  What time? 0 points 0 points  Count back from 20 0 points 0  points  Months in reverse 0 points 0 points  Repeat phrase 0 points 0 points  Total Score 0 0   Immunization History  Administered Date(s) Administered   Influenza Split 04/09/2012   Influenza Whole 05/24/2008   Influenza,inj,Quad PF,6+ Mos 06/02/2014, 05/03/2015   Influenza-Unspecified 05/03/2013, 06/05/2016, 04/25/2017, 04/21/2018, 05/05/2019   PFIZER SARS-COV-2 Vaccination 08/10/2019, 08/30/2019   Pneumococcal Conjugate-13 08/27/2013   Pneumococcal Polysaccharide-23 08/21/2012   Td 02/20/1999   Tdap 08/09/2011   Zoster 10/27/2008   Screening Tests Health Maintenance  Topic Date Due   INFLUENZA VACCINE  02/20/2020   LIPID PANEL  12/27/2020   TETANUS/TDAP  08/08/2021   COLONOSCOPY  11/01/2025   COVID-19 Vaccine  Completed   Hepatitis C Screening  Completed   PNA vac Low Risk Adult  Completed   Cancer Screenings: Lung: Low Dose CT Chest recommended if Age 20-80 years, 30 pack-year currently smoking OR have quit w/in 15years. Patient does not qualify. Colorectal: UTD  Additional Screenings: Hepatitis C Screening: Completed    Plan:  Get your Flu Vaccine this fall! Keep up the good work at the gym! Follow-up with PCP as needed.   I have personally reviewed and noted the following in the patients chart:    Medical and social history  Use of alcohol, tobacco or illicit drugs   Current medications and supplements  Functional ability and status  Nutritional status  Physical activity  Advanced directives  List of other physicians  Hospitalizations, surgeries, and ER visits in previous 12 months  Vitals  Screenings to include cognitive, depression, and falls  Referrals and appointments  In addition, I have reviewed and discussed with patient certain preventive protocols, quality metrics, and best practice recommendations. A written personalized care plan for preventive services as well as general preventive health recommendations were  provided to patient.  This visit was conducted virtually in the setting of the [place] pandemic.    Dorna Bloom, [place]  03/31/2020

## 2020-03-27 ENCOUNTER — Other Ambulatory Visit: Payer: Self-pay | Admitting: Family Medicine

## 2020-03-31 NOTE — Patient Instructions (Addendum)
You spoke to Alan Gonzalez, [place] over the phone for your annual wellness visit.  We discussed goals: Goals    .  Maintain current level of physical activities (pt-stated)    .  Maintain current level of physical activity (pt-stated)    .  Patient Stated      Continue to maintain current level of physical activity       We also discussed recommended health maintenance. As discussed, you are up to date with everything! Flu Vaccine due this fall.   Health Maintenance  Topic Date Due  . INFLUENZA VACCINE  02/20/2020  . LIPID PANEL  12/27/2020  . TETANUS/TDAP  08/08/2021  . COLONOSCOPY  11/01/2025  . COVID-19 Vaccine  Completed  . Hepatitis C Screening  Completed  . PNA vac Low Risk Adult  Completed   Get your Flu Vaccine this fall! Keep up the good work at the gym! Follow-up with PCP as needed.   Preventive Care 72 Years and Older, Male Preventive care refers to lifestyle choices and visits with your health care provider that can promote health and wellness. This includes:  A yearly physical exam. This is also called an annual well check.  Regular dental and eye exams.  Immunizations.  Screening for certain conditions.  Healthy lifestyle choices, such as diet and exercise. What can I expect for my preventive care visit? Physical exam Your health care provider will check:  Height and weight. These may be used to calculate body mass index (BMI), which is a measurement that tells if you are at a healthy weight.  Heart rate and blood pressure.  Your skin for abnormal spots. Counseling Your health care provider may ask you questions about:  Alcohol, tobacco, and drug use.  Emotional well-being.  Home and relationship well-being.  Sexual activity.  Eating habits.  History of falls.  Memory and ability to understand (cognition).  Work and work Statistician. What immunizations do I need?  Influenza (flu) vaccine  This is recommended every year. Tetanus,  diphtheria, and pertussis (Tdap) vaccine  You may need a Td booster every 10 years. Varicella (chickenpox) vaccine  You may need this vaccine if you have not already been vaccinated. Zoster (shingles) vaccine  You may need this after age 57. Pneumococcal conjugate (PCV13) vaccine  One dose is recommended after age 80. Pneumococcal polysaccharide (PPSV23) vaccine  One dose is recommended after age 50. Measles, mumps, and rubella (MMR) vaccine  You may need at least one dose of MMR if you were born in 1957 or later. You may also need a second dose. Meningococcal conjugate (MenACWY) vaccine  You may need this if you have certain conditions. Hepatitis A vaccine  You may need this if you have certain conditions or if you travel or work in places where you may be exposed to hepatitis A. Hepatitis B vaccine  You may need this if you have certain conditions or if you travel or work in places where you may be exposed to hepatitis B. Haemophilus influenzae type b (Hib) vaccine  You may need this if you have certain conditions. You may receive vaccines as individual doses or as more than one vaccine together in one shot (combination vaccines). Talk with your health care provider about the risks and benefits of combination vaccines. What tests do I need? Blood tests  Lipid and cholesterol levels. These may be checked every 5 years, or more frequently depending on your overall health.  Hepatitis C test.  Hepatitis B test.  Screening  Lung cancer screening. You may have this screening every year starting at age 70 if you have a 30-pack-year history of smoking and currently smoke or have quit within the past 15 years.  Colorectal cancer screening. All adults should have this screening starting at age 49 and continuing until age 75. Your health care provider may recommend screening at age 28 if you are at increased risk. You will have tests every 1-10 years, depending on your results and  the type of screening test.  Prostate cancer screening. Recommendations will vary depending on your family history and other risks.  Diabetes screening. This is done by checking your blood sugar (glucose) after you have not eaten for a while (fasting). You may have this done every 1-3 years.  Abdominal aortic aneurysm (AAA) screening. You may need this if you are a current or former smoker.  Sexually transmitted disease (STD) testing. Follow these instructions at home: Eating and drinking  Eat a diet that includes fresh fruits and vegetables, whole grains, lean protein, and low-fat dairy products. Limit your intake of foods with high amounts of sugar, saturated fats, and salt.  Take vitamin and mineral supplements as recommended by your health care provider.  Do not drink alcohol if your health care provider tells you not to drink.  If you drink alcohol: ? Limit how much you have to 0-2 drinks a day. ? Be aware of how much alcohol is in your drink. In the [place] one drink equals one 12 oz bottle of beer (355 mL), one 5 oz glass of wine (148 mL), or one 1 oz glass of hard liquor (44 mL). Lifestyle  Take daily care of your teeth and gums.  Stay active. Exercise for at least 30 minutes on 5 or more days each week.  Do not use any products that contain nicotine or tobacco, such as cigarettes, e-cigarettes, and chewing tobacco. If you need help quitting, ask your health care provider.  If you are sexually active, practice safe sex. Use a condom or other form of protection to prevent STIs (sexually transmitted infections).  Talk with your health care provider about taking a low-dose aspirin or statin. What's next?  Visit your health care provider once a year for a well check visit.  Ask your health care provider how often you should have your eyes and teeth checked.  Stay up to date on all vaccines. This information is not intended to replace advice given to you by your health care  provider. Make sure you discuss any questions you have with your health care provider. Document Revised: 07/02/2018 Document Reviewed: 07/02/2018 Elsevier Patient Education  2020 [facility]'s number is [phone]. Please call with questions or concerns about what we discussed today.

## 2020-04-03 ENCOUNTER — Other Ambulatory Visit: Payer: Self-pay

## 2020-04-03 ENCOUNTER — Other Ambulatory Visit: Payer: Self-pay | Admitting: Family Medicine

## 2020-04-03 DIAGNOSIS — B0089 Other herpesviral infection: Secondary | ICD-10-CM

## 2020-04-03 MED ORDER — ACYCLOVIR 800 MG PO TABS: 800.0000 mg | ORAL_TABLET | Freq: Two times a day (BID) | ORAL | 11 refills | Status: DC

## 2020-04-03 NOTE — Progress Notes (Signed)
I have reviewed this visit and agree with the documentation.   

## 2020-04-18 DIAGNOSIS — Z0120 Encounter for dental examination and cleaning without abnormal findings: Secondary | ICD-10-CM | POA: Diagnosis not present

## 2020-04-27 DIAGNOSIS — Z20822 Contact with and (suspected) exposure to covid-19: Secondary | ICD-10-CM | POA: Diagnosis not present

## 2020-06-22 ENCOUNTER — Other Ambulatory Visit: Payer: Self-pay | Admitting: Family Medicine

## 2020-09-18 ENCOUNTER — Telehealth: Payer: Self-pay | Admitting: Family Medicine

## 2020-09-18 DIAGNOSIS — E7800 Pure hypercholesterolemia, unspecified: Secondary | ICD-10-CM

## 2020-09-18 DIAGNOSIS — Z8349 Family history of other endocrine, nutritional and metabolic diseases: Secondary | ICD-10-CM

## 2020-09-18 DIAGNOSIS — N200 Calculus of kidney: Secondary | ICD-10-CM

## 2020-09-18 DIAGNOSIS — D696 Thrombocytopenia, unspecified: Secondary | ICD-10-CM

## 2020-09-18 NOTE — Telephone Encounter (Signed)
Patient is calling to see if doctor is wanting him to get lab work before appointment he states he normally does that so doctor has results at appointment. Please advise. Thanks!

## 2020-09-18 NOTE — Telephone Encounter (Signed)
Called patient and LM that future orders were entered.

## 2020-09-20 ENCOUNTER — Other Ambulatory Visit: Payer: Medicare Other

## 2020-09-20 ENCOUNTER — Other Ambulatory Visit: Payer: Self-pay

## 2020-09-20 DIAGNOSIS — E7800 Pure hypercholesterolemia, unspecified: Secondary | ICD-10-CM | POA: Diagnosis not present

## 2020-09-20 DIAGNOSIS — Z8349 Family history of other endocrine, nutritional and metabolic diseases: Secondary | ICD-10-CM

## 2020-09-20 DIAGNOSIS — D696 Thrombocytopenia, unspecified: Secondary | ICD-10-CM

## 2020-09-20 DIAGNOSIS — N200 Calculus of kidney: Secondary | ICD-10-CM | POA: Diagnosis not present

## 2020-09-21 LAB — CMP14+EGFR
ALT: 13 IU/L (ref 0–44)
AST: 18 IU/L (ref 0–40)
Albumin/Globulin Ratio: 1.7 (ref 1.2–2.2)
Albumin: 4.4 g/dL (ref 3.7–4.7)
Alkaline Phosphatase: 83 IU/L (ref 44–121)
BUN/Creatinine Ratio: 13 (ref 10–24)
BUN: 17 mg/dL (ref 8–27)
Bilirubin Total: 0.6 mg/dL (ref 0.0–1.2)
CO2: 25 mmol/L (ref 20–29)
Calcium: 9.5 mg/dL (ref 8.6–10.2)
Chloride: 105 mmol/L (ref 96–106)
Creatinine, Ser: 1.26 mg/dL (ref 0.76–1.27)
Globulin, Total: 2.6 g/dL (ref 1.5–4.5)
Glucose: 97 mg/dL (ref 65–99)
Potassium: 4.6 mmol/L (ref 3.5–5.2)
Sodium: 143 mmol/L (ref 134–144)
Total Protein: 7 g/dL (ref 6.0–8.5)
eGFR: 60 mL/min/{1.73_m2} (ref >59)

## 2020-09-21 LAB — CBC
Hematocrit: 47 % (ref 37.5–51.0)
Hemoglobin: 15.6 g/dL (ref 13.0–17.7)
MCH: 31.3 pg (ref 26.6–33.0)
MCHC: 33.2 g/dL (ref 31.5–35.7)
MCV: 94 fL (ref 79–97)
Platelets: 170 10*3/uL (ref 150–450)
RBC: 4.99 x10E6/uL (ref 4.14–5.80)
RDW: 12.7 % (ref 11.6–15.4)
WBC: 5.6 10*3/uL (ref 3.4–10.8)

## 2020-09-21 LAB — LIPID PANEL
Chol/HDL Ratio: 2.7 ratio (ref 0.0–5.0)
Cholesterol, Total: 164 mg/dL (ref 100–199)
HDL: 61 mg/dL (ref >39)
LDL Chol Calc (NIH): 88 mg/dL (ref 0–99)
Triglycerides: 81 mg/dL (ref 0–149)
VLDL Cholesterol Cal: 15 mg/dL (ref 5–40)

## 2020-09-21 LAB — IRON AND TIBC
Iron Saturation: 30 % (ref 15–55)
Iron: 87 ug/dL (ref 38–169)
Total Iron Binding Capacity: 287 ug/dL (ref 250–450)
UIBC: 200 ug/dL (ref 111–343)

## 2020-09-21 LAB — URIC ACID: Uric Acid: 6.1 mg/dL (ref 3.8–8.4)

## 2020-09-25 ENCOUNTER — Ambulatory Visit (INDEPENDENT_AMBULATORY_CARE_PROVIDER_SITE_OTHER): Payer: Medicare Other | Admitting: Family Medicine

## 2020-09-25 ENCOUNTER — Encounter: Payer: Self-pay | Admitting: Family Medicine

## 2020-09-25 ENCOUNTER — Other Ambulatory Visit: Payer: Self-pay

## 2020-09-25 DIAGNOSIS — M5382 Other specified dorsopathies, cervical region: Secondary | ICD-10-CM

## 2020-09-25 DIAGNOSIS — Z0000 Encounter for general adult medical examination without abnormal findings: Secondary | ICD-10-CM

## 2020-09-25 DIAGNOSIS — R739 Hyperglycemia, unspecified: Secondary | ICD-10-CM

## 2020-09-25 DIAGNOSIS — I10 Essential (primary) hypertension: Secondary | ICD-10-CM

## 2020-09-25 DIAGNOSIS — E7800 Pure hypercholesterolemia, unspecified: Secondary | ICD-10-CM

## 2020-09-25 DIAGNOSIS — Z8349 Family history of other endocrine, nutritional and metabolic diseases: Secondary | ICD-10-CM

## 2020-09-25 LAB — POCT GLYCOSYLATED HEMOGLOBIN (HGB A1C): Hemoglobin A1C: 5.4 % (ref 4.0–5.6)

## 2020-09-25 MED ORDER — CANDESARTAN CILEXETIL 4 MG PO TABS: 4.0000 mg | ORAL_TABLET | Freq: Every day | ORAL | 3 refills | Status: DC

## 2020-09-25 NOTE — Assessment & Plan Note (Signed)
Start low dose ARB.

## 2020-09-25 NOTE — Assessment & Plan Note (Signed)
Labs are reassuring

## 2020-09-25 NOTE — Progress Notes (Signed)
    SUBJECTIVE:   CHIEF COMPLAINT / HPI:   Annual exam: Issues: 1. Randomly, blood sugars are mildly elevated.  I did not check A1C with this year's blood work.  Thin and no polyuria or polydipsia 2. Injured both knees when skydiving 6 months ago.  Knees are 85% better.  No locking of giving out.  Does not want referral or imaging today. 3. Had an episode of 2 weeks of epigastric discomfort that resolved on its own.  Now pain free.  No NSAID use.  No N, V or weight loss. 4. Ingrown toenails.  Wants to see podiatrist.  Doesn't think he needs a referral. 5. Increase in Left occipital headaches.  Often awakens with headache.  Has known cervical disc disease and arthritis.   6. Erectile dysfunction.  Continues to use cialis.   7/ Elevated BP and thus far I have not called hypertension.  Extensive list of home BPs show average is between 845-364 systolic and 68-03 diastolic.   Health Maint.   Has had COVID booster.  Does not have his card.  Did get influenza vaccine on 05/10/20.  Recently took his first shingles vaccine.  PERTINENT  PMH / PSH: no bleeding, chest pain, SOB.  No change in bowel, bladder or appetite.  OBJECTIVE:   BP (!) 150/100   Pulse (!) 50   Ht 6\' 5"  (1.956 m)   Wt 205 lb 9.6 oz (93.3 kg)   SpO2 98%   BMI 24.38 kg/m   HEENT WNL Neck supple Lungs clear Cardiac RRR without m or g Abd benign.   Ext no edema Neuro, gait, mentation affect and mood WNL  ASSESSMENT/PLAN:   Routine general medical examination at a health care facility Healthy male with no at risk behaviors.  Hypertension, essential Start low dose ARB.    Family history of hemochromatosis Labs are reassuring.  Musculoskeletal disorder and symptoms referable to neck Likely cause of his headaches.  HYPERCHOLESTEROLEMIA Cholesterol at goal on current therapy.     Zenia Resides, MD [place]

## 2020-09-25 NOTE — Assessment & Plan Note (Signed)
Likely cause of his headaches.

## 2020-09-25 NOTE — Assessment & Plan Note (Signed)
Cholesterol at goal on current therapy.

## 2020-09-25 NOTE — Assessment & Plan Note (Signed)
Healthy male with no at risk behaviors. 

## 2020-09-25 NOTE — Patient Instructions (Signed)
Keep up the great work. I did start a puny dose of a high blood pressure medication. Get blood work one week from now. I hope the second shingles shot is not bad. My chart a photo of your covid booster so we can put in our records.   Let me know if you need a referral to podiatry.

## 2020-10-02 DIAGNOSIS — H524 Presbyopia: Secondary | ICD-10-CM | POA: Diagnosis not present

## 2020-10-03 DIAGNOSIS — D1801 Hemangioma of skin and subcutaneous tissue: Secondary | ICD-10-CM | POA: Diagnosis not present

## 2020-10-03 DIAGNOSIS — D225 Melanocytic nevi of trunk: Secondary | ICD-10-CM | POA: Diagnosis not present

## 2020-10-03 DIAGNOSIS — Z85828 Personal history of other malignant neoplasm of skin: Secondary | ICD-10-CM | POA: Diagnosis not present

## 2020-10-03 DIAGNOSIS — D2261 Melanocytic nevi of right upper limb, including shoulder: Secondary | ICD-10-CM | POA: Diagnosis not present

## 2020-10-03 DIAGNOSIS — C4441 Basal cell carcinoma of skin of scalp and neck: Secondary | ICD-10-CM | POA: Diagnosis not present

## 2020-10-04 ENCOUNTER — Other Ambulatory Visit: Payer: Medicare Other

## 2020-10-04 ENCOUNTER — Other Ambulatory Visit: Payer: Self-pay

## 2020-10-04 DIAGNOSIS — I10 Essential (primary) hypertension: Secondary | ICD-10-CM | POA: Diagnosis not present

## 2020-10-05 LAB — BASIC METABOLIC PANEL
BUN/Creatinine Ratio: 14 (ref 10–24)
BUN: 18 mg/dL (ref 8–27)
CO2: 25 mmol/L (ref 20–29)
Calcium: 9.5 mg/dL (ref 8.6–10.2)
Chloride: 104 mmol/L (ref 96–106)
Creatinine, Ser: 1.28 mg/dL — ABNORMAL HIGH (ref 0.76–1.27)
Glucose: 105 mg/dL — ABNORMAL HIGH (ref 65–99)
Potassium: 4.5 mmol/L (ref 3.5–5.2)
Sodium: 144 mmol/L (ref 134–144)
eGFR: 59 mL/min/{1.73_m2} — ABNORMAL LOW (ref >59)

## 2020-10-25 ENCOUNTER — Encounter: Payer: Self-pay | Admitting: Family Medicine

## 2020-10-25 DIAGNOSIS — I10 Essential (primary) hypertension: Secondary | ICD-10-CM

## 2020-10-30 MED ORDER — CANDESARTAN CILEXETIL 4 MG PO TABS: 8.0000 mg | ORAL_TABLET | Freq: Every day | ORAL | 3 refills | Status: DC

## 2020-10-30 NOTE — Addendum Note (Signed)
Addended by: Zenia Resides on: 10/30/2020 10:51 AM   Modules accepted: Orders

## 2020-11-08 ENCOUNTER — Encounter: Payer: Self-pay | Admitting: Family Medicine

## 2020-11-08 DIAGNOSIS — I10 Essential (primary) hypertension: Secondary | ICD-10-CM

## 2020-11-08 MED ORDER — CANDESARTAN CILEXETIL 4 MG PO TABS: 16.0000 mg | ORAL_TABLET | Freq: Every day | ORAL | 3 refills | Status: DC

## 2020-11-13 ENCOUNTER — Other Ambulatory Visit: Payer: Self-pay | Admitting: Family Medicine

## 2020-11-13 DIAGNOSIS — E7800 Pure hypercholesterolemia, unspecified: Secondary | ICD-10-CM

## 2020-11-20 MED ORDER — CANDESARTAN CILEXETIL 16 MG PO TABS: 16.0000 mg | ORAL_TABLET | Freq: Every day | ORAL | 3 refills | Status: DC

## 2020-11-20 NOTE — Addendum Note (Signed)
Addended by: Zenia Resides on: 11/20/2020 03:57 PM   Modules accepted: Orders

## 2021-03-27 DIAGNOSIS — H04123 Dry eye syndrome of bilateral lacrimal glands: Secondary | ICD-10-CM | POA: Diagnosis not present

## 2021-03-27 DIAGNOSIS — H25812 Combined forms of age-related cataract, left eye: Secondary | ICD-10-CM | POA: Diagnosis not present

## 2021-03-27 DIAGNOSIS — H2511 Age-related nuclear cataract, right eye: Secondary | ICD-10-CM | POA: Diagnosis not present

## 2021-03-27 DIAGNOSIS — H10413 Chronic giant papillary conjunctivitis, bilateral: Secondary | ICD-10-CM | POA: Diagnosis not present

## 2021-04-16 ENCOUNTER — Encounter: Payer: Self-pay | Admitting: Family Medicine

## 2021-04-16 ENCOUNTER — Other Ambulatory Visit: Payer: Self-pay | Admitting: Family Medicine

## 2021-04-16 DIAGNOSIS — H9193 Unspecified hearing loss, bilateral: Secondary | ICD-10-CM | POA: Insufficient documentation

## 2021-04-16 DIAGNOSIS — B0089 Other herpesviral infection: Secondary | ICD-10-CM

## 2021-04-19 ENCOUNTER — Ambulatory Visit (INDEPENDENT_AMBULATORY_CARE_PROVIDER_SITE_OTHER): Payer: Medicare Other

## 2021-04-19 ENCOUNTER — Other Ambulatory Visit: Payer: Self-pay

## 2021-04-19 VITALS — Ht 77.0 in | Wt 195.0 lb

## 2021-04-19 DIAGNOSIS — Z0000 Encounter for general adult medical examination without abnormal findings: Secondary | ICD-10-CM | POA: Diagnosis not present

## 2021-04-19 NOTE — Progress Notes (Addendum)
Subjective:   Alan Lister, Alan Gonzalez is a 75 y.o. male who presents for Medicare Annual/Subsequent preventive examination.  The patient consented to a virtual visit. Patient consented to have virtual visit and was identified by name and date of birth. Method of visit: Telephone  Encounter participants: Patient: Alan Lister, Alan Gonzalez - located at Home Nurse/Provider: Dorna Bloom - located at [facility] Others (if applicable): NA  Review of Systems: Defer to PCP.  Cardiac Risk Factors include: male gender;advanced age (>26men, >19 women)  Objective:   Vitals: Ht 6\' 5"  (1.956 m)   Wt 195 lb (88.5 kg)   BMI 23.12 kg/m   Body mass index is 23.12 kg/m.  Advanced Directives 04/19/2021 03/23/2020 05/10/2019 09/09/2017 06/05/2017 09/05/2016 08/31/2015  Does Patient Have a Medical Advance Directive? Yes Yes Yes No Yes Yes Yes  Type of Paramedic of [place];Living will Living will;Healthcare Power of [place];Living will - [place];Living will [place];Living will -  Does patient want to make changes to medical advance directive? No - Patient declined No - Patient declined - - No - Patient declined - -  Copy of [place] in Chart? No - copy requested No - copy requested No - copy requested - No - copy requested No - copy requested Yes  Would patient like information on creating a medical advance directive? - - - No - Patient declined - No - Patient declined -   Tobacco Social History   Tobacco Use  Smoking Status Never  Smokeless Tobacco Never     Clinical Intake:  Pre-visit preparation completed: Yes  Pain Score: 0-No pain  How often do you need to have someone help you when you read instructions, pamphlets, or other written materials from your doctor or pharmacy?: 1 - Never What is the last grade level you completed in school?: Alan Gonzalez-PSY  Past Medical History:  Diagnosis Date    Actinic keratosis 10/26/2008   Qualifier: Diagnosis of  By: Andria Frames MD, [place], NOS 09/18/2006   Qualifier: Diagnosis of  By: Andria Frames MD, [place]     Diverticulitis    Diverticulitis of colon 09/18/2006   Pattern is one to two flairs per year of diverticulitis which has been easily managed by early augmentin treatment.     Elevated blood sugar 09/09/2014   ERECTILE DYSFUNCTION 10/26/2008   Qualifier: Diagnosis of  By: Andria Frames MD, Alan Gonzalez     Family history of hemochromatosis 09/09/2017   Father had symtomatic, lab proven hemochromatosis.  Likely needs genetic testing.     HYPERCHOLESTEROLEMIA 09/18/2006   Primary prevention: No known CAD, CVA, PVD or DM     Hyperlipidemia    Left leg cellulitis 08/21/2012   Left ventricular diastolic dysfunction with preserved systolic function 16/04/9603   class I diastolic dysfunction by echo October 2016    Palpitations 08/19/2019   Peripheral neuropathy 09/09/2014   Recurrent oral herpes simplex infection 09/09/2014   Seborrheic keratoses 08/27/2013   Thrombocytopenia ([place]) 07/26/2013   Has low normal platelet count and seems to have a qualitative platelet defect in that he bleeds very easily - previous normal work up.    Uric acid kidney stone 03/13/2015   Stone analysis done 03/2015 Uric acid stones occur in 10% of all kidney stones and are the second most-common cause of urinary stones after calcium oxalate and calcium phosphate calculi. The most important risk factor for uric acid  crystallization and stone formation is a low urine pH (below 5.5) rather than an increased urinary uric acid excretion. Main causes of low urine pH are tubular disorders   Vitamin D deficiency disease 09/09/2014   Past Surgical History:  Procedure Laterality Date   EYE SURGERY     SPINE SURGERY     1990s   Family History  Problem Relation Age of Onset   Cancer Mother        Vulvar   COPD Father    Cancer Sister        breast   Heart disease Sister     Heart disease Brother    Thyroid disease Sister    Social History   Socioeconomic History   Marital status: Married    Spouse name: [name]   Number of children: 3   Years of education: Alan Gonzalez   Highest education level: Not on file  Occupational History   Occupation: Alan Gonzalez-physcologist     Employer: [employer]  Tobacco Use   Smoking status: Never   Smokeless tobacco: Never  Vaping Use   Vaping Use: Never used  Substance and Sexual Activity   Alcohol use: Not Currently   Drug use: No   Sexual activity: Yes    Comment: monagomous  Other Topics Concern   Not on file  Social History Narrative   Emergency Contact: wife, [name], [phone]   Diet: Pt has a varied diet of protein, starch, and vegetables/fruits   Seatbelts: Pt reports wearing seatbelt when in vehicles.    Sun Exposure/Protection: Pt reports wearing ball cap, long sleeve   Hobbies: golf, exercise, outdoors, reading       Patient lives with wife [name]) in two level home 03/23/2020   Transportation: Patient has own vehicle and drives himself 81/82/9937   Important Relationships Spouse, kids, grandkids, friends  03/23/2020   Pets: None 03/23/2020   Education / Work:  Alan Gonzalez/ Psychologist 03/23/2020   Interests / Fun: Golf, travel, keep busy, pickle ball 04/19/2021   Current Stressors: Minor life hassles 03/23/2020   Religious / Personal Beliefs: Catholic 16/96/7893                                                                                                   Social Determinants of Health   Financial Resource Strain: Low Risk    Difficulty of Paying Living Expenses: Not hard at all  Food Insecurity: No Food Insecurity   Worried About Charity fundraiser in the Last Year: Never true   [place] in the Last Year: Never true  Transportation Needs: No Transportation Needs   Lack of Transportation (Medical): No   Lack of Transportation (Non-Medical): No  Physical Activity: Sufficiently Active    Days of Exercise per Week: 5 days   Minutes of Exercise per Session: 60 min  Stress: No Stress Concern Present   Feeling of Stress : Only a little  Social Connections: Engineer, building services of Communication with Friends and Family: More than three times a week   Frequency of Social Gatherings with  Friends and Family: More than three times a week   Attends Religious Services: 1 to 4 times per year   Active Member of Clubs or Organizations: Yes   Attends Archivist Meetings: More than 4 times per year   Marital Status: Married   Outpatient Encounter Medications as of 04/19/2021  Medication Sig   acyclovir (ZOVIRAX) 800 MG tablet TAKE 1 TABLET(800 MG) BY MOUTH TWICE DAILY   amoxicillin-clavulanate (AUGMENTIN) 875-125 MG tablet Take 1 tablet by mouth 2 (two) times daily as needed (Diverticulitis).   candesartan (ATACAND) 16 MG tablet Take 1 tablet (16 mg total) by mouth at bedtime.   cholecalciferol (VITAMIN D) 1000 units tablet Take 1,000 Units daily by mouth.   diazepam (VALIUM) 5 MG tablet Take 1 tablet (5 mg total) by mouth every 12 (twelve) hours as needed for muscle spasms.   rosuvastatin (CRESTOR) 20 MG tablet TAKE 1 TABLET(20 MG) BY MOUTH DAILY   tadalafil (CIALIS) 20 MG tablet TAKE ONE-HALF ONE TABLET BY MOUTH EVERY OTHER DAY   No facility-administered encounter medications on file as of 04/19/2021.   Activities of Daily Living In your present state of health, do you have any difficulty performing the following activities: 04/19/2021  Hearing? N  Comment no hearing aides, however in the process of hearing aides  Vision? Y  Comment cataracts  Difficulty concentrating or making decisions? N  Walking or climbing stairs? N  Dressing or bathing? N  Doing errands, shopping? N  Preparing Food and eating ? N  Using the Toilet? N  In the past six months, have you accidently leaked urine? N  Do you have problems with loss of bowel control? N  Managing your  Medications? N  Managing your Finances? N  Housekeeping or managing your Housekeeping? N  Some recent data might be hidden   Patient Care Team: Zenia Resides, MD as PCP - Evalina Field, MD (Ophthalmology) Mariana Arn (Dentistry)   Assessment:   This is a routine wellness examination for Alan Gonzalez.  Exercise Activities and Dietary recommendations Current Exercise Habits: Structured exercise class, Type of exercise: treadmill, Time (Minutes): 60, Frequency (Times/Week): 5, Weekly Exercise (Minutes/Week): 300, Intensity: Moderate, Exercise limited by: None identified   Goals       Maintain current level of physical activities (pt-stated)      Maintain current level of physical activity (pt-stated)      Patient Stated      Continue to maintain current level of physical activity        Fall Risk Fall Risk  04/19/2021 03/23/2020 08/19/2019 05/10/2019 09/10/2018  Falls in the past year? 0 0 0 0 0  Number falls in past yr: 0 - - - -  Injury with Fall? 0 - - - -  Risk for fall due to : No Fall Risks - - - -  Follow up Falls prevention discussed - - - -   Patient reports normal gait and balance. Patient does not use assistive devices to ambulate.  Is the patient's home free of loose throw rugs in walkways, pet beds, electrical cords, etc?   yes      Grab bars in the bathroom? yes      Handrails on the stairs?   yes      Adequate lighting?   yes   Patient rating of health (0-10): 9  Depression Screen PHQ 2/9 Scores 04/19/2021 09/25/2020 03/31/2020 03/23/2020  PHQ - 2 Score 0 0 0 0   Cognitive Function  MMSE - Mini Mental State Exam 08/23/2013 04/23/2012  Orientation to time 5 5  Orientation to Place 5 5  Registration 3 3  Attention/ Calculation 5 5  Recall 3 3  Language- name 2 objects 2 2  Language- repeat 1 1  Language- follow 3 step command 3 3  Language- read & follow direction 1 1  Write a sentence 1 1  Copy design 1 1  Total score 30 30   6CIT Screen 04/19/2021  03/23/2020 05/10/2019  What Year? 0 points 0 points 0 points  What month? 0 points 0 points 0 points  What time? 0 points 0 points 0 points  Count back from 20 0 points 0 points 0 points  Months in reverse 0 points 0 points 0 points  Repeat phrase 0 points 0 points 0 points  Total Score 0 0 0   Immunization History  Administered Date(s) Administered   Influenza Split 04/09/2012   Influenza Whole 05/24/2008   Influenza,inj,Quad PF,6+ Mos 06/02/2014, 05/03/2015   Influenza-Unspecified 05/03/2013, 06/05/2016, 04/25/2017, 04/21/2018, 05/05/2019, 05/10/2020   PFIZER(Purple Top)SARS-COV-2 Vaccination 08/10/2019, 08/30/2019, 11/07/2020, 03/29/2021   Pfizer Covid-19 Vaccine Bivalent Booster 62yrs & up 03/22/2021   Pneumococcal Conjugate-13 08/27/2013   Pneumococcal Polysaccharide-23 08/21/2012   Td 02/20/1999   Tdap 08/09/2011   Unspecified SARS-COV-2 Vaccination 04/16/2020   Zoster Recombinat (Shingrix) 02/19/2021, 03/03/2021   Zoster, Live 10/27/2008   Screening Tests Health Maintenance  Topic Date Due   INFLUENZA VACCINE  02/19/2021   Zoster Vaccines- Shingrix (2 of 2) 04/28/2021   TETANUS/TDAP  08/08/2021   LIPID PANEL  09/20/2021   COLONOSCOPY (Pts 45-14yrs Insurance coverage will need to be confirmed)  11/01/2025   COVID-19 Vaccine  Completed   Hepatitis C Screening  Completed   HPV VACCINES  Aged Out   Eye Exam: UTD- Dr. Katy Fitch   Cancer Screenings: Lung: Low Dose CT Chest recommended if Age 33-80 years, 30 pack-year currently smoking OR have quit w/in 15years. Patient does not qualify. Colorectal: UTD  Additional Screenings: Hepatitis C Screening: Completed    Plan:  PCP annual physical due March 2023. Get your flu shot towards the end of October per preference.  I have updated your health maintenance to reflect recent vaccines.  I have personally reviewed and noted the following in the patient's chart:   Medical and social history Use of alcohol, tobacco or illicit  drugs  Current medications and supplements Functional ability and status Nutritional status Physical activity Advanced directives List of other physicians Hospitalizations, surgeries, and ER visits in previous 12 months Vitals Screenings to include cognitive, depression, and falls Referrals and appointments  In addition, I have reviewed and discussed with patient certain preventive protocols, quality metrics, and best practice recommendations. A written personalized care plan for preventive services as well as general preventive health recommendations were provided to patient.  This visit was conducted virtually in the setting of the [place] pandemic.   Dorna Bloom, [place]  04/19/2021

## 2021-04-19 NOTE — Progress Notes (Signed)
I have reviewed this visit and agree with the documentation.   

## 2021-04-19 NOTE — Patient Instructions (Addendum)
You spoke to Alan Gonzalez over the phone for your annual wellness visit.  We discussed goals:   Goals       Maintain current level of physical activities (pt-stated)      Maintain current level of physical activity (pt-stated)      Patient Stated      Continue to maintain current level of physical activity        We also discussed recommended health maintenance. As discussed, you are due for:  Health Maintenance  Topic Date Due   INFLUENZA VACCINE  02/19/2021   Zoster Vaccines- Shingrix (2 of 2) 04/28/2021   TETANUS/TDAP  08/08/2021   LIPID PANEL  09/20/2021   COLONOSCOPY (Pts 45-46yrs Insurance coverage will need to be confirmed)  11/01/2025   COVID-19 Vaccine  Completed   Hepatitis C Screening  Completed   HPV VACCINES  Aged Out   PCP annual physical due March 2023. Get your flu shot towards the end of October per preference.  I have updated your health maintenance to reflect recent vaccines.  Preventive Care 7 Years and Older, Male Preventive care refers to lifestyle choices and visits with your health care provider that can promote health and wellness. This includes: A yearly physical exam. This is also called an annual wellness visit. Regular dental and eye exams. Immunizations. Screening for certain conditions. Healthy lifestyle choices, such as: Eating a healthy diet. Getting regular exercise. Not using drugs or products that contain nicotine and tobacco. Limiting alcohol use. What can I expect for my preventive care visit? Physical exam Your health care provider will check your: Height and weight. These may be used to calculate your BMI (body mass index). BMI is a measurement that tells if you are at a healthy weight. Heart rate and blood pressure. Body temperature. Skin for abnormal spots. Counseling Your health care provider may ask you questions about your: Past medical problems. Family's medical history. Alcohol, tobacco, and drug use. Emotional  well-being. Home life and relationship well-being. Sexual activity. Diet, exercise, and sleep habits. History of falls. Memory and ability to understand (cognition). Work and work Statistician. Access to firearms. What immunizations do I need? Vaccines are usually given at various ages, according to a schedule. Your health care provider will recommend vaccines for you based on your age, medical history, and lifestyle or other factors, such as travel or where you work. What tests do I need? Blood tests Lipid and cholesterol levels. These may be checked every 5 years, or more often depending on your overall health. Hepatitis C test. Hepatitis B test. Screening Lung cancer screening. You may have this screening every year starting at age 83 if you have a 30-pack-year history of smoking and currently smoke or have quit within the past 15 years. Colorectal cancer screening. All adults should have this screening starting at age 39 and continuing until age 68. Your health care provider may recommend screening at age 48 if you are at increased risk. You will have tests every 1-10 years, depending on your results and the type of screening test. Prostate cancer screening. Recommendations will vary depending on your family history and other risks. Genital exam to check for testicular cancer or hernias. Diabetes screening. This is done by checking your blood sugar (glucose) after you have not eaten for a while (fasting). You may have this done every 1-3 years. Abdominal aortic aneurysm (AAA) screening. You may need this if you are a current or former smoker. STD (sexually transmitted disease) testing, if  you are at risk. Follow these instructions at home: Eating and drinking  Eat a diet that includes fresh fruits and vegetables, whole grains, lean protein, and low-fat dairy products. Limit your intake of foods with high amounts of sugar, saturated fats, and salt. Take vitamin and mineral  supplements as recommended by your health care provider. Do not drink alcohol if your health care provider tells you not to drink. If you drink alcohol: Limit how much you have to 0-2 drinks a day. Be aware of how much alcohol is in your drink. In the [place] one drink equals one 12 oz bottle of beer (355 mL), one 5 oz glass of wine (148 mL), or one 1 oz glass of hard liquor (44 mL). Lifestyle Take daily care of your teeth and gums. Brush your teeth every morning and night with fluoride toothpaste. Floss one time each day. Stay active. Exercise for at least 30 minutes 5 or more days each week. Do not use any products that contain nicotine or tobacco, such as cigarettes, e-cigarettes, and chewing tobacco. If you need help quitting, ask your health care provider. Do not use drugs. If you are sexually active, practice safe sex. Use a condom or other form of protection to prevent STIs (sexually transmitted infections). Talk with your health care provider about taking a low-dose aspirin or statin. Find healthy ways to cope with stress, such as: Meditation, yoga, or listening to music. Journaling. Talking to a trusted person. Spending time with friends and family. Safety Always wear your seat belt while driving or riding in a vehicle. Do not drive: If you have been drinking alcohol. Do not ride with someone who has been drinking. When you are tired or distracted. While texting. Wear a helmet and other protective equipment during sports activities. If you have firearms in your house, make sure you follow all gun safety procedures. What's next? Visit your health care provider once a year for an annual wellness visit. Ask your health care provider how often you should have your eyes and teeth checked. Stay up to date on all vaccines. This information is not intended to replace advice given to you by your health care provider. Make sure you discuss any questions you have with your health care  provider. Document Revised: 09/15/2020 Document Reviewed: 07/02/2018 Elsevier Patient Education  2022 [employer].  Our clinic's number is [phone]. Please call with questions or concerns about what we discussed today.

## 2021-05-03 DIAGNOSIS — H903 Sensorineural hearing loss, bilateral: Secondary | ICD-10-CM | POA: Diagnosis not present

## 2021-07-11 ENCOUNTER — Other Ambulatory Visit: Payer: Self-pay | Admitting: Family Medicine

## 2021-08-28 ENCOUNTER — Telehealth: Payer: Self-pay | Admitting: Family Medicine

## 2021-08-28 DIAGNOSIS — I10 Essential (primary) hypertension: Secondary | ICD-10-CM

## 2021-08-28 DIAGNOSIS — E7800 Pure hypercholesterolemia, unspecified: Secondary | ICD-10-CM

## 2021-08-28 NOTE — Telephone Encounter (Signed)
Alan Gonzalez scheduled appointment for complete physical on 10/01/2021. Alan Gonzalez is wanting to know if doctor Andria Frames is still wanting him to get labs done before the appointment, if so could could the orders be placed and shoot a message back to me so I can call Alan Gonzalez back to get a lab date scheduled for him. Please!  Thank you!

## 2021-08-28 NOTE — Telephone Encounter (Signed)
Future orders entered. 

## 2021-08-30 NOTE — Telephone Encounter (Signed)
Pt informed and will call to schedule a lab appt.Dejohn Ibarra Zimmerman Rumple, CMA

## 2021-09-26 ENCOUNTER — Other Ambulatory Visit: Payer: Self-pay

## 2021-09-26 ENCOUNTER — Other Ambulatory Visit: Payer: Medicare Other

## 2021-09-26 DIAGNOSIS — I10 Essential (primary) hypertension: Secondary | ICD-10-CM | POA: Diagnosis not present

## 2021-09-26 DIAGNOSIS — E7800 Pure hypercholesterolemia, unspecified: Secondary | ICD-10-CM

## 2021-09-27 LAB — CMP14+EGFR
ALT: 18 IU/L (ref 0–44)
AST: 26 IU/L (ref 0–40)
Albumin/Globulin Ratio: 1.8 (ref 1.2–2.2)
Albumin: 4.5 g/dL (ref 3.7–4.7)
Alkaline Phosphatase: 76 IU/L (ref 44–121)
BUN/Creatinine Ratio: 16 (ref 10–24)
BUN: 22 mg/dL (ref 8–27)
Bilirubin Total: 0.6 mg/dL (ref 0.0–1.2)
CO2: 26 mmol/L (ref 20–29)
Calcium: 9.7 mg/dL (ref 8.6–10.2)
Chloride: 106 mmol/L (ref 96–106)
Creatinine, Ser: 1.36 mg/dL — ABNORMAL HIGH (ref 0.76–1.27)
Globulin, Total: 2.5 g/dL (ref 1.5–4.5)
Glucose: 107 mg/dL — ABNORMAL HIGH (ref 70–99)
Potassium: 5.7 mmol/L — ABNORMAL HIGH (ref 3.5–5.2)
Sodium: 143 mmol/L (ref 134–144)
Total Protein: 7 g/dL (ref 6.0–8.5)
eGFR: 55 mL/min/{1.73_m2} — ABNORMAL LOW (ref >59)

## 2021-09-27 LAB — CBC
Hematocrit: 44 % (ref 37.5–51.0)
Hemoglobin: 14.5 g/dL (ref 13.0–17.7)
MCH: 31.2 pg (ref 26.6–33.0)
MCHC: 33 g/dL (ref 31.5–35.7)
MCV: 95 fL (ref 79–97)
Platelets: 142 10*3/uL — ABNORMAL LOW (ref 150–450)
RBC: 4.65 x10E6/uL (ref 4.14–5.80)
RDW: 12.9 % (ref 11.6–15.4)
WBC: 5.8 10*3/uL (ref 3.4–10.8)

## 2021-09-27 LAB — LIPID PANEL
Chol/HDL Ratio: 2.2 ratio (ref 0.0–5.0)
Cholesterol, Total: 146 mg/dL (ref 100–199)
HDL: 65 mg/dL (ref >39)
LDL Chol Calc (NIH): 66 mg/dL (ref 0–99)
Triglycerides: 79 mg/dL (ref 0–149)
VLDL Cholesterol Cal: 15 mg/dL (ref 5–40)

## 2021-09-28 ENCOUNTER — Telehealth: Payer: Self-pay | Admitting: Family Medicine

## 2021-09-28 NOTE — Telephone Encounter (Signed)
Called patient left vm to read his mychart message or if can not or if has questions to call my cell [phone] ? ?

## 2021-10-01 ENCOUNTER — Ambulatory Visit (INDEPENDENT_AMBULATORY_CARE_PROVIDER_SITE_OTHER): Payer: Medicare Other | Admitting: Family Medicine

## 2021-10-01 ENCOUNTER — Encounter: Payer: Self-pay | Admitting: Family Medicine

## 2021-10-01 ENCOUNTER — Other Ambulatory Visit: Payer: Self-pay

## 2021-10-01 VITALS — BP 124/66 | HR 47 | Ht 77.0 in | Wt 201.2 lb

## 2021-10-01 DIAGNOSIS — Z23 Encounter for immunization: Secondary | ICD-10-CM

## 2021-10-01 DIAGNOSIS — E7800 Pure hypercholesterolemia, unspecified: Secondary | ICD-10-CM

## 2021-10-01 DIAGNOSIS — N200 Calculus of kidney: Secondary | ICD-10-CM

## 2021-10-01 DIAGNOSIS — I10 Essential (primary) hypertension: Secondary | ICD-10-CM

## 2021-10-01 DIAGNOSIS — S80811A Abrasion, right lower leg, initial encounter: Secondary | ICD-10-CM | POA: Insufficient documentation

## 2021-10-01 DIAGNOSIS — R001 Bradycardia, unspecified: Secondary | ICD-10-CM

## 2021-10-01 DIAGNOSIS — Z0000 Encounter for general adult medical examination without abnormal findings: Secondary | ICD-10-CM | POA: Diagnosis not present

## 2021-10-01 MED ORDER — AMLODIPINE BESYLATE 5 MG PO TABS: 5.0000 mg | ORAL_TABLET | Freq: Every day | ORAL | 3 refills | Status: DC

## 2021-10-01 NOTE — Patient Instructions (Addendum)
Stop the candisartan. ?Start the new blood pressure medicine.  Of course, keep doing the great job of providing me with data.   ?Call for a lab only appointment in 2 weeks.  You do not need to be fasting. ?Please bring in your flu and shingles shot records and give to a [facility] nurse.   ?You will get a Tdap today (tetanus and pertussis booster) ?Remind me to look for a [facility] primary care doc in [facility].   ?

## 2021-10-01 NOTE — Assessment & Plan Note (Signed)
Non infected.  Due for tetanus booster.   ?

## 2021-10-02 ENCOUNTER — Encounter: Payer: Self-pay | Admitting: Family Medicine

## 2021-10-02 DIAGNOSIS — R001 Bradycardia, unspecified: Secondary | ICD-10-CM | POA: Insufficient documentation

## 2021-10-02 NOTE — Assessment & Plan Note (Signed)
Stable. No further work up. 

## 2021-10-02 NOTE — Assessment & Plan Note (Signed)
DC candisartan and start amlodipine.  Recheck BMP in 2 weeks with good hydration.  Further WU for CKD if still elevated. ?Home blood pressures to make sure amlodipine effective. ?

## 2021-10-02 NOTE — Progress Notes (Signed)
? ? ?  SUBJECTIVE:  ? ?CHIEF COMPLAINT / HPI:  ? ?Annual exam: ?No complaints: Chronic issues ?HBP.  Just started ARB last year.  Unfortunately, he had a poor BP response despite me increasing dose.  Pre exam lab work shows mild bump in creat.  See below.  Brings in extensive home records.  BP still not well controled. ?Elevated creat.  May have some CKD (HBP is his only risk factor.)  Also had hyperkalemia.  Works outside a lot.  Does not drink much fluids.  Recognizes dehydration may play some role.  Of note, creat was 1.26 before starting ARB.  Also has higher muscle mass than the average 75 yo which may give deceptivly high creat ?Bradycardia: chronic no CP, DOE, or syncope.  Has been seen by cards.  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  No change in the last year from his standpoint.  Wears a fitbit and knows that his heartrate is often in the 40s as he sleeps.   ?Recurrent diverticulitis.  No recent events.  Responds well to oral antibiotics which he takes at the onset of a flare. ?No recent kidney stones.  Has only had two lifetime episodes.   ?Due for tetanus.  Has abrasion leg ? ?Prevention: Up to date on all measures.  He has had both a flu shot and shingles series, not reflected in our reords.  Outstanding exercise.  No at risk behaviors. ? ?PERTINENT  PMH / PSH: No CP, SOB, bleeding, or worrisome skin lesions.  Denies change in bowel, bladder or weight. ? ?OBJECTIVE:  ? ?BP 124/66   Pulse (!) 47   Ht '6\' 5"'$  (1.956 m)   Wt 201 lb 3.2 oz (91.3 kg)   SpO2 97%   BMI 23.86 kg/m?   ?Neck s thyromegally ?Lungs clear ?Cardiac RRR brady without m or g ?Abd benign ?Ext no edema.  Non infected abrasion right leg ? ?ASSESSMENT/PLAN:  ? ?Abrasion of right leg ?Non infected.  Due for tetanus booster.   ? ?Routine general medical examination at a health care facility ?Healthy male.  No at risk behaviors and healthy lifestyle. ? ?Hypertension, essential ?DC candisartan and start amlodipine.  Recheck BMP in 2 weeks with  good hydration.  Further WU for CKD if still elevated. ?Home blood pressures to make sure amlodipine effective. ? ?HYPERCHOLESTEROLEMIA ?Good LDL on current meds ? ?Uric acid kidney stone ?He is not interested in preventive treatment given the infrequency of his kidney stones. ? ?Bradycardia ?Stable.  No further work up.  ? ? ?Zenia Resides, MD ?[place]  ?

## 2021-10-02 NOTE — Assessment & Plan Note (Signed)
He is not interested in preventive treatment given the infrequency of his kidney stones. ?

## 2021-10-02 NOTE — Assessment & Plan Note (Signed)
Good LDL on current meds ?

## 2021-10-02 NOTE — Assessment & Plan Note (Signed)
Healthy male.  No at risk behaviors and healthy lifestyle. ?

## 2021-10-23 ENCOUNTER — Other Ambulatory Visit: Payer: Medicare Other

## 2021-10-23 DIAGNOSIS — I10 Essential (primary) hypertension: Secondary | ICD-10-CM

## 2021-10-24 LAB — BASIC METABOLIC PANEL
BUN/Creatinine Ratio: 23 (ref 10–24)
BUN: 26 mg/dL (ref 8–27)
CO2: 26 mmol/L (ref 20–29)
Calcium: 9.8 mg/dL (ref 8.6–10.2)
Chloride: 106 mmol/L (ref 96–106)
Creatinine, Ser: 1.13 mg/dL (ref 0.76–1.27)
Glucose: 80 mg/dL (ref 70–99)
Potassium: 4.7 mmol/L (ref 3.5–5.2)
Sodium: 144 mmol/L (ref 134–144)
eGFR: 68 mL/min/{1.73_m2} (ref >59)

## 2021-11-01 DIAGNOSIS — D0362 Melanoma in situ of left upper limb, including shoulder: Secondary | ICD-10-CM | POA: Diagnosis not present

## 2021-11-01 DIAGNOSIS — D485 Neoplasm of uncertain behavior of skin: Secondary | ICD-10-CM | POA: Diagnosis not present

## 2021-11-01 DIAGNOSIS — L821 Other seborrheic keratosis: Secondary | ICD-10-CM | POA: Diagnosis not present

## 2021-11-01 DIAGNOSIS — Z85828 Personal history of other malignant neoplasm of skin: Secondary | ICD-10-CM | POA: Diagnosis not present

## 2021-11-01 DIAGNOSIS — D225 Melanocytic nevi of trunk: Secondary | ICD-10-CM | POA: Diagnosis not present

## 2021-11-24 ENCOUNTER — Other Ambulatory Visit: Payer: Self-pay | Admitting: Family Medicine

## 2021-12-25 ENCOUNTER — Encounter: Payer: Self-pay | Admitting: *Deleted

## 2021-12-26 DIAGNOSIS — L988 Other specified disorders of the skin and subcutaneous tissue: Secondary | ICD-10-CM | POA: Diagnosis not present

## 2021-12-26 DIAGNOSIS — D485 Neoplasm of uncertain behavior of skin: Secondary | ICD-10-CM | POA: Diagnosis not present

## 2021-12-26 DIAGNOSIS — Z85828 Personal history of other malignant neoplasm of skin: Secondary | ICD-10-CM | POA: Diagnosis not present

## 2021-12-26 DIAGNOSIS — D0362 Melanoma in situ of left upper limb, including shoulder: Secondary | ICD-10-CM | POA: Diagnosis not present

## 2022-01-14 ENCOUNTER — Other Ambulatory Visit: Payer: Self-pay | Admitting: Family Medicine

## 2022-01-14 DIAGNOSIS — I10 Essential (primary) hypertension: Secondary | ICD-10-CM

## 2022-04-01 DIAGNOSIS — H2511 Age-related nuclear cataract, right eye: Secondary | ICD-10-CM | POA: Diagnosis not present

## 2022-04-01 DIAGNOSIS — H524 Presbyopia: Secondary | ICD-10-CM | POA: Diagnosis not present

## 2022-04-01 DIAGNOSIS — H43812 Vitreous degeneration, left eye: Secondary | ICD-10-CM | POA: Diagnosis not present

## 2022-04-01 DIAGNOSIS — H25812 Combined forms of age-related cataract, left eye: Secondary | ICD-10-CM | POA: Diagnosis not present

## 2022-05-15 DIAGNOSIS — Z85820 Personal history of malignant melanoma of skin: Secondary | ICD-10-CM | POA: Diagnosis not present

## 2022-05-15 DIAGNOSIS — Z85828 Personal history of other malignant neoplasm of skin: Secondary | ICD-10-CM | POA: Diagnosis not present

## 2022-05-15 DIAGNOSIS — L821 Other seborrheic keratosis: Secondary | ICD-10-CM | POA: Diagnosis not present

## 2022-05-15 DIAGNOSIS — D225 Melanocytic nevi of trunk: Secondary | ICD-10-CM | POA: Diagnosis not present

## 2022-05-21 ENCOUNTER — Other Ambulatory Visit: Payer: Self-pay

## 2022-05-21 ENCOUNTER — Emergency Department (HOSPITAL_BASED_OUTPATIENT_CLINIC_OR_DEPARTMENT_OTHER)
Admission: EM | Admit: 2022-05-21 | Discharge: 2022-05-21 | Disposition: A | Discharge status: Home / Self Care | Payer: Medicare Other | Attending: Emergency Medicine | Admitting: Emergency Medicine

## 2022-05-21 ENCOUNTER — Encounter (HOSPITAL_BASED_OUTPATIENT_CLINIC_OR_DEPARTMENT_OTHER): Payer: Self-pay | Admitting: Emergency Medicine

## 2022-05-21 DIAGNOSIS — R339 Retention of urine, unspecified: Secondary | ICD-10-CM

## 2022-05-21 DIAGNOSIS — N3000 Acute cystitis without hematuria: Secondary | ICD-10-CM | POA: Insufficient documentation

## 2022-05-21 DIAGNOSIS — D72829 Elevated white blood cell count, unspecified: Secondary | ICD-10-CM | POA: Insufficient documentation

## 2022-05-21 LAB — URINALYSIS, ROUTINE W REFLEX MICROSCOPIC
Bilirubin Urine: NEGATIVE
Glucose, UA: NEGATIVE mg/dL
Nitrite: NEGATIVE
Protein, ur: 30 mg/dL — AB
Specific Gravity, Urine: 1.02 (ref 1.005–1.030)
Trans Epithel, UA: <1
pH: 6 (ref 5.0–8.0)

## 2022-05-21 LAB — CBC
HCT: 44.3 % (ref 39.0–52.0)
Hemoglobin: 15 g/dL (ref 13.0–17.0)
MCH: 31.9 pg (ref 26.0–34.0)
MCHC: 33.9 g/dL (ref 30.0–36.0)
MCV: 94.3 fL (ref 80.0–100.0)
Platelets: 147 10*3/uL — ABNORMAL LOW (ref 150–400)
RBC: 4.7 MIL/uL (ref 4.22–5.81)
RDW: 13.2 % (ref 11.5–15.5)
WBC: 10.8 10*3/uL — ABNORMAL HIGH (ref 4.0–10.5)
nRBC: 0 % (ref 0.0–0.2)

## 2022-05-21 LAB — BASIC METABOLIC PANEL
Anion gap: 10 (ref 5–15)
BUN: 24 mg/dL — ABNORMAL HIGH (ref 8–23)
CO2: 23 mmol/L (ref 22–32)
Calcium: 9.1 mg/dL (ref 8.9–10.3)
Chloride: 106 mmol/L (ref 98–111)
Creatinine, Ser: 1.6 mg/dL — ABNORMAL HIGH (ref 0.61–1.24)
GFR, Estimated: 45 mL/min — ABNORMAL LOW (ref >60)
Glucose, Bld: 164 mg/dL — ABNORMAL HIGH (ref 70–99)
Potassium: 4.1 mmol/L (ref 3.5–5.1)
Sodium: 139 mmol/L (ref 135–145)

## 2022-05-21 MED ORDER — CEPHALEXIN 500 MG PO CAPS: 500.0000 mg | ORAL_CAPSULE | Freq: Two times a day (BID) | ORAL | 0 refills | Status: AC

## 2022-05-21 MED ORDER — SODIUM CHLORIDE 0.9 % IV BOLUS
1000.0000 mL | Freq: Once | INTRAVENOUS | Status: AC
  Administered 2022-05-21: 1000 mL via INTRAVENOUS

## 2022-05-21 MED ORDER — SODIUM CHLORIDE 0.9 % IV SOLN
1.0000 g | Freq: Once | INTRAVENOUS | Status: AC
  Administered 2022-05-21: 1 g via INTRAVENOUS
  Filled 2022-05-21: qty 10

## 2022-05-21 NOTE — ED Triage Notes (Signed)
Presents for chills, dysuria, urgency, frequency and oliguria that started yesterday. No h/o bladder or prostate issues. Endorses fever, abd distention without pain, generalized weakness Denies hematuria, penile discharge, recent trauma to abd, testicle pain. H/o diverticulitis (takes augmentin chronically), HTN

## 2022-05-21 NOTE — ED Notes (Signed)
EDP at bedside  

## 2022-05-21 NOTE — ED Provider Notes (Signed)
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 No N/V, subjective fever at home. No prior history of similar.    Home Medications Prior to Admission medications   Medication Sig Start Date End Date Taking? Authorizing Provider  cephALEXin (KEFLEX) 500 MG capsule Take 1 capsule (500 mg total) by mouth 2 (two) times daily for 7 days. 05/21/22 05/28/22 Yes Truddie Hidden, MD  acyclovir (ZOVIRAX) 800 MG tablet TAKE 1 TABLET(800 MG) BY MOUTH TWICE DAILY 04/16/21   Zenia Resides, MD  amLODipine (NORVASC) 5 MG tablet TAKE 1 TABLET(5 MG) BY MOUTH AT BEDTIME 01/14/22   Zenia Resides, MD  amoxicillin-clavulanate (AUGMENTIN) 875-125 MG tablet Take 1 tablet by mouth 2 (two) times daily as needed (Diverticulitis). 09/20/19   Zenia Resides, MD  candesartan (ATACAND) 16 MG tablet TAKE 1 TABLET BY MOUTH EVERY NIGHT AT BEDTIME 11/26/21   Zenia Resides, MD  cholecalciferol (VITAMIN D) 1000 units tablet Take 1,000 Units daily by mouth.    [provider]  diazepam (VALIUM) 5 MG tablet Take 1 tablet (5 mg total) by mouth every 12 (twelve) hours as needed for muscle spasms. 09/20/19   Zenia Resides, MD  rosuvastatin (CRESTOR) 20 MG tablet TAKE 1 TABLET(20 MG) BY MOUTH DAILY 07/11/21   Zenia Resides, MD  tadalafil (CIALIS) 20 MG tablet TAKE ONE-HALF ONE TABLET BY MOUTH EVERY OTHER DAY 11/13/20   McDiarmid, Blane Ohara, MD     Allergies    Patient has no known allergies.   Review of Systems   Review of Systems Please see HPI for pertinent positives and negatives  Physical Exam BP 120/61   Pulse (!) 58   Temp 99.6 F (37.6 C) (Oral)   Resp 18   Wt 90.7 kg   SpO2 97%   BMI 23.72  kg/m   Physical Exam Vitals and nursing note reviewed.  HENT:     Head: Normocephalic.     Nose: Nose normal.  Eyes:     Extraocular Movements: Extraocular movements intact.  Pulmonary:     Effort: Pulmonary effort is normal.  Abdominal:     General: Abdomen is flat. There is no distension.     Palpations: Abdomen is soft.     Tenderness: There is no abdominal tenderness.  Musculoskeletal:        General: Normal range of motion.     Cervical back: Neck supple.  Skin:    Findings: No rash (on exposed skin).  Neurological:     Mental Status: He is alert and oriented to person, place, and time.  Psychiatric:        Mood and Affect: Mood normal.     ED Results / Procedures / Treatments   EKG None  Procedures Procedures  Medications Ordered in the ED Medications  sodium chloride 0.9 % bolus 1,000 mL (0 mLs Intravenous Stopped 05/21/22 0559)  cefTRIAXone (ROCEPHIN) 1 g in sodium chloride 0.9 % 100 mL IVPB (0 g Intravenous Stopped 05/21/22 0559)    Initial Impression and Plan  Patient here with symptoms of urinary retention, had several hundred ccs of post-void residual urine on bladder scan. Foley was placed just prior to my evaluation and  is draining clear fluid now.   ED Course   Clinical Course as of 05/21/22 0631  Tue May 21, 2022  0413 CBC with mild leukocytosis.  [CS]  0156 BMP with increased Cr compared to previous from earlier in the year. UA with blood and signs of infection. Will give a dose of Rocephin and a liter of saline.  [CS]  1537 Patient reports he is feeling better and ready to go home. Discussed close outpatient follow up with Urology for foley removal. RTED for any worsening pain, fever, vomiting or other concerns. Plan Rx for Keflex for UTI. Culture is pending.  [CS]    Clinical Course User Index [CS] Truddie Hidden, MD     MDM Rules/Calculators/A&P Medical Decision Making Problems Addressed: Acute cystitis without hematuria: acute  illness or injury Urinary retention: acute illness or injury  Amount and/or Complexity of Data Reviewed Labs: ordered. Decision-making details documented in ED Course.  Risk Prescription drug management.    Final Clinical Impression(s) / ED Diagnoses Final diagnoses:  Urinary retention  Acute cystitis without hematuria    Rx / DC Orders ED Discharge Orders          Ordered    cephALEXin (KEFLEX) 500 MG capsule  2 times daily        05/21/22 0546             Truddie Hidden, MD 05/21/22 [phone]

## 2022-05-22 LAB — URINE CULTURE: Culture: NO GROWTH

## 2022-05-28 ENCOUNTER — Telehealth: Payer: Self-pay

## 2022-05-28 ENCOUNTER — Encounter: Payer: Self-pay | Admitting: Urology

## 2022-05-28 ENCOUNTER — Ambulatory Visit: Payer: Medicare Other | Admitting: Physician Assistant

## 2022-05-28 ENCOUNTER — Ambulatory Visit: Payer: Medicare Other | Admitting: Urology

## 2022-05-28 VITALS — BP 174/86 | HR 49 | Ht 78.0 in | Wt 197.0 lb

## 2022-05-28 DIAGNOSIS — R339 Retention of urine, unspecified: Secondary | ICD-10-CM | POA: Diagnosis not present

## 2022-05-28 DIAGNOSIS — N410 Acute prostatitis: Secondary | ICD-10-CM

## 2022-05-28 DIAGNOSIS — N402 Nodular prostate without lower urinary tract symptoms: Secondary | ICD-10-CM | POA: Diagnosis not present

## 2022-05-28 DIAGNOSIS — N401 Enlarged prostate with lower urinary tract symptoms: Secondary | ICD-10-CM

## 2022-05-28 DIAGNOSIS — N403 Nodular prostate with lower urinary tract symptoms: Secondary | ICD-10-CM

## 2022-05-28 MED ORDER — SULFAMETHOXAZOLE-TRIMETHOPRIM 800-160 MG PO TABS: 1.0000 | ORAL_TABLET | Freq: Two times a day (BID) | ORAL | 0 refills | Status: DC

## 2022-05-28 NOTE — Progress Notes (Signed)
Fill and Pull Catheter Removal  Patient is present today for a catheter removal.  Patient was cleaned and prepped in a sterile fashion 262m of sterile water/ saline was instilled into the bladder when the patient felt the urge to urinate. 10 ml of water was then drained from the balloon.  A 16FR foley cath was removed from the bladder no complications were noted .  Patient as then given some time to void on their own.  Patient can void  100 ml on their own after some time.  Patient tolerated well.  Performed by: SMarisue Brooklyn CMA  Follow up/ Additional notes: Follow up as scheduled

## 2022-05-28 NOTE — Telephone Encounter (Signed)
Tried calling patient to rescheduled. Left voiced message for patient to call our office back.

## 2022-05-28 NOTE — Progress Notes (Signed)
H&P  Chief Complaint: Urinary retention  History of Present Illness: 76 year old male presents at this time for evaluation and management of recent urinary retention.  He presented to the emergency room at [facility] on Halloween with fever, lower urinary tract symptoms including frequency urgency dysuria.  He was found to have retention although I do not see any volume of the bladder after he was catheterized.  He was sent home with a weeks worth of Keflex.  He does have baseline slow urinary stream.  He was not on any medical therapy for BPH prior to his presentation to the emergency room.  He has not had a PSA checked in 11 years.  Past Medical History:  Diagnosis Date   Actinic keratosis 10/26/2008   Qualifier: Diagnosis of  By: Andria Frames MD, [place], NOS 09/18/2006   Qualifier: Diagnosis of  By: Andria Frames MD, Andrzej     Diverticulitis    Diverticulitis of colon 09/18/2006   Pattern is one to two flairs per year of diverticulitis which has been easily managed by early augmentin treatment.     Elevated blood sugar 09/09/2014   ERECTILE DYSFUNCTION 10/26/2008   Qualifier: Diagnosis of  By: Andria Frames MD, [name]     Family history of hemochromatosis 09/09/2017   Father had symtomatic, lab proven hemochromatosis.  Likely needs genetic testing.     HYPERCHOLESTEROLEMIA 09/18/2006   Primary prevention: No known CAD, CVA, PVD or DM     Hyperlipidemia    Left leg cellulitis 08/21/2012   Left ventricular diastolic dysfunction with preserved systolic function 70/35/0093   class I diastolic dysfunction by echo October 2016    Palpitations 08/19/2019   Peripheral neuropathy 09/09/2014   Recurrent oral herpes simplex infection 09/09/2014   Seborrheic keratoses 08/27/2013   Thrombocytopenia ([place]) 07/26/2013   Has low normal platelet count and seems to have a qualitative platelet defect in that he bleeds very easily - previous normal work up.    Uric acid kidney stone 03/13/2015    Stone analysis done 03/2015 Uric acid stones occur in 10% of all kidney stones and are the second most-common cause of urinary stones after calcium oxalate and calcium phosphate calculi. The most important risk factor for uric acid crystallization and stone formation is a low urine pH (below 5.5) rather than an increased urinary uric acid excretion. Main causes of low urine pH are tubular disorders   Vitamin D deficiency disease 09/09/2014    Past Surgical History:  Procedure Laterality Date   EYE SURGERY     SPINE SURGERY     1990s    Home Medications:  Allergies as of 05/28/2022   No Known Allergies      Medication List        Accurate as of May 28, 2022 10:08 AM. If you have any questions, ask your nurse or doctor.          acyclovir 800 MG tablet Commonly known as: ZOVIRAX TAKE 1 TABLET(800 MG) BY MOUTH TWICE DAILY   amLODipine 5 MG tablet Commonly known as: NORVASC TAKE 1 TABLET(5 MG) BY MOUTH AT BEDTIME   amoxicillin-clavulanate 875-125 MG tablet Commonly known as: AUGMENTIN Take 1 tablet by mouth 2 (two) times daily as needed (Diverticulitis).   candesartan 16 MG tablet Commonly known as: ATACAND TAKE 1 TABLET BY MOUTH EVERY NIGHT AT BEDTIME   cephALEXin 500 MG capsule Commonly known as: Keflex Take 1 capsule (500 mg total) by mouth 2 (two)  times daily for 7 days.   cholecalciferol 1000 units tablet Commonly known as: VITAMIN D Take 1,000 Units daily by mouth.   diazepam 5 MG tablet Commonly known as: VALIUM Take 1 tablet (5 mg total) by mouth every 12 (twelve) hours as needed for muscle spasms.   rosuvastatin 20 MG tablet Commonly known as: CRESTOR TAKE 1 TABLET(20 MG) BY MOUTH DAILY   tadalafil 20 MG tablet Commonly known as: CIALIS TAKE ONE-HALF ONE TABLET BY MOUTH EVERY OTHER DAY        Allergies: No Known Allergies  Family History  Problem Relation Age of Onset   Cancer Mother        Vulvar   COPD Father    Cancer Sister         breast   Heart disease Sister    Heart disease Brother    Thyroid disease Sister     Social History:  reports that he has never smoked. He has never used smokeless tobacco. He reports that he does not currently use alcohol. He reports that he does not use drugs.  ROS: A complete review of systems was performed.  All systems are negative except for pertinent findings as noted.  Physical Exam:  Vital signs in last 24 hours: BP (!) 174/86   Pulse (!) 49   Ht '6\' 6"'$  (1.981 m)   Wt 197 lb (89.4 kg)   BMI 22.77 kg/m  Constitutional:  Alert and oriented, No acute distress Cardiovascular: Regular rate  Respiratory: Normal respiratory effort GI: No inguinal hernia. Genitourinary: Normal male phallus, testes are descended bilaterally and non-tender and without masses, scrotum is normal in appearance without lesions or masses, perineum is normal on inspection.  Foley catheter is present.  Rectal reveals a 60 g gland with 2 separate nodules on the right mid/base each 3 to 4 mm in size. Lymphatic: No lymphadenopathy Neurologic: Grossly intact, no focal deficits Psychiatric: Normal mood and affect  I have reviewed prior pt notes  I have reviewed notes from referring/previous physicians-ER notes reviewed  I have reviewed urine culture results-negative   I have reviewed prior PSA results-last PSA in 2012 and was in the 2 range     Impression/Assessment:  1.  Urinary retention with BPH and inciting factor of probable prostatitis although culture was negative  2.  Prostate nodule  3.  Probable prostatitis  Plan:  1.  I will bring him back in 3 to 4 weeks to check residual urine volume as well as to repeat PSA  2.  I will send him home with another weeks worth of antibiotic-Bactrim

## 2022-05-30 ENCOUNTER — Ambulatory Visit: Payer: Medicare Other | Admitting: Physician Assistant

## 2022-06-04 ENCOUNTER — Ambulatory Visit: Payer: Medicare Other | Admitting: Urology

## 2022-06-04 ENCOUNTER — Encounter: Payer: Self-pay | Admitting: Urology

## 2022-06-04 VITALS — BP 152/88 | HR 54

## 2022-06-04 DIAGNOSIS — N401 Enlarged prostate with lower urinary tract symptoms: Secondary | ICD-10-CM

## 2022-06-04 DIAGNOSIS — R339 Retention of urine, unspecified: Secondary | ICD-10-CM

## 2022-06-04 DIAGNOSIS — N138 Other obstructive and reflux uropathy: Secondary | ICD-10-CM

## 2022-06-04 DIAGNOSIS — N410 Acute prostatitis: Secondary | ICD-10-CM

## 2022-06-04 DIAGNOSIS — N402 Nodular prostate without lower urinary tract symptoms: Secondary | ICD-10-CM

## 2022-06-04 LAB — BLADDER SCAN AMB NON-IMAGING: Scan Result: 476

## 2022-06-04 MED ORDER — ALFUZOSIN HCL ER 10 MG PO TB24: 10.0000 mg | ORAL_TABLET | Freq: Every day | ORAL | 11 refills | Status: DC

## 2022-06-04 NOTE — Progress Notes (Addendum)
Patient was taught [facility] without any difficulty. Sent prescription to [facility].

## 2022-06-04 NOTE — Patient Instructions (Signed)

## 2022-06-04 NOTE — Progress Notes (Signed)
History of Present Illness: This man came in last week urgently for urinary retention.  Note is as follows:   76 year old male presents at this time for evaluation and management of recent urinary retention.  He presented to the emergency room at [facility] on Halloween with fever, lower urinary tract symptoms including frequency urgency dysuria.  He was found to have retention although I do not see any volume of the bladder after he was catheterized.  He was sent home with a weeks worth of Keflex.   He does have baseline slow urinary stream.  He was not on any medical therapy for BPH prior to his presentation to the emergency room.  He has not had a PSA checked in 11 years.  He had 2 right-sided prostate nodules.  He was given a voiding trial which he passed.  11.14.2023: Here for difficulty voiding with dysuria.  His voiding has become worse over the past 2 to 3 days with feeling of incomplete emptying and discomfort.  No fevers or chills.    Past Medical History:  Diagnosis Date   Actinic keratosis 10/26/2008   Qualifier: Diagnosis of  By: Andria Frames MD, [place], NOS 09/18/2006   Qualifier: Diagnosis of  By: Andria Frames MD, Kiril     Diverticulitis    Diverticulitis of colon 09/18/2006   Pattern is one to two flairs per year of diverticulitis which has been easily managed by early augmentin treatment.     Elevated blood sugar 09/09/2014   ERECTILE DYSFUNCTION 10/26/2008   Qualifier: Diagnosis of  By: Andria Frames MD, [name]     Family history of hemochromatosis 09/09/2017   Father had symtomatic, lab proven hemochromatosis.  Likely needs genetic testing.     HYPERCHOLESTEROLEMIA 09/18/2006   Primary prevention: No known CAD, CVA, PVD or DM     Hyperlipidemia    Left leg cellulitis 08/21/2012   Left ventricular diastolic dysfunction with preserved systolic function 57/84/6962   class I diastolic dysfunction by echo October 2016    Palpitations 08/19/2019   Peripheral  neuropathy 09/09/2014   Recurrent oral herpes simplex infection 09/09/2014   Seborrheic keratoses 08/27/2013   Thrombocytopenia ([place]) 07/26/2013   Has low normal platelet count and seems to have a qualitative platelet defect in that he bleeds very easily - previous normal work up.    Uric acid kidney stone 03/13/2015   Stone analysis done 03/2015 Uric acid stones occur in 10% of all kidney stones and are the second most-common cause of urinary stones after calcium oxalate and calcium phosphate calculi. The most important risk factor for uric acid crystallization and stone formation is a low urine pH (below 5.5) rather than an increased urinary uric acid excretion. Main causes of low urine pH are tubular disorders   Vitamin D deficiency disease 09/09/2014    Past Surgical History:  Procedure Laterality Date   EYE SURGERY     SPINE SURGERY     1990s    Home Medications:  Allergies as of 06/04/2022   No Known Allergies      Medication List        Accurate as of June 04, 2022  8:10 AM. If you have any questions, ask your nurse or doctor.          acyclovir 800 MG tablet Commonly known as: ZOVIRAX TAKE 1 TABLET(800 MG) BY MOUTH TWICE DAILY   amLODipine 5 MG tablet Commonly known as: NORVASC TAKE 1 TABLET(5 MG) BY  MOUTH AT BEDTIME   amoxicillin-clavulanate 875-125 MG tablet Commonly known as: AUGMENTIN Take 1 tablet by mouth 2 (two) times daily as needed (Diverticulitis).   candesartan 16 MG tablet Commonly known as: ATACAND TAKE 1 TABLET BY MOUTH EVERY NIGHT AT BEDTIME   cholecalciferol 1000 units tablet Commonly known as: VITAMIN D Take 1,000 Units daily by mouth.   diazepam 5 MG tablet Commonly known as: VALIUM Take 1 tablet (5 mg total) by mouth every 12 (twelve) hours as needed for muscle spasms.   rosuvastatin 20 MG tablet Commonly known as: CRESTOR TAKE 1 TABLET(20 MG) BY MOUTH DAILY   sulfamethoxazole-trimethoprim 800-160 MG tablet Commonly known as: BACTRIM  DS Take 1 tablet by mouth 2 (two) times daily.   tadalafil 20 MG tablet Commonly known as: CIALIS TAKE ONE-HALF ONE TABLET BY MOUTH EVERY OTHER DAY        Allergies: No Known Allergies  Family History  Problem Relation Age of Onset   Cancer Mother        Vulvar   COPD Father    Cancer Sister        breast   Heart disease Sister    Heart disease Brother    Thyroid disease Sister     Social History:  reports that he has never smoked. He has never used smokeless tobacco. He reports that he does not currently use alcohol. He reports that he does not use drugs.  ROS: A complete review of systems was performed.  All systems are negative except for pertinent findings as noted.  Physical Exam:  Vital signs in last 24 hours: There were no vitals taken for this visit. Constitutional:  Alert and oriented, No acute distress Cardiovascular: Regular rate  Respiratory: Normal respiratory effort GI: Abdomen is soft, nontender, nondistended, no abdominal masses. No CVAT.  Genitourinary: Normal male phallus, testes are descended bilaterally and non-tender and without masses, scrotum is normal in appearance without lesions or masses, perineum is normal on inspection. Lymphatic: No lymphadenopathy Neurologic: Grossly intact, no focal deficits Psychiatric: Normal mood and affect  I have reviewed prior pt notes  I have reviewed urinalysis results-does not appear to be infected  I have independently reviewed prior imaging  I have reviewed prior PSA results  I have reviewed prior urine culture   Impression/Assessment:  BPH with worsening emptying, residual urine today 15 ounces  Prostate nodule with no recent PSA today  Plan:  1.  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  He is to do this at least 3-4 times a day  2.  I added alfuzosin  3. OV 1 week  4. Urine cultured

## 2022-06-05 LAB — URINALYSIS, ROUTINE W REFLEX MICROSCOPIC
Bilirubin, UA: NEGATIVE
Glucose, UA: NEGATIVE
Ketones, UA: NEGATIVE
Leukocytes,UA: NEGATIVE
Nitrite, UA: NEGATIVE
Protein,UA: NEGATIVE
Specific Gravity, UA: 1.02 (ref 1.005–1.030)
Urobilinogen, Ur: 0.2 mg/dL (ref 0.2–1.0)
pH, UA: 5.5 (ref 5.0–7.5)

## 2022-06-05 LAB — MICROSCOPIC EXAMINATION: Bacteria, UA: NONE SEEN

## 2022-06-06 LAB — URINE CULTURE: Organism ID, Bacteria: NO GROWTH

## 2022-06-11 ENCOUNTER — Encounter: Payer: Self-pay | Admitting: Urology

## 2022-06-11 ENCOUNTER — Ambulatory Visit (INDEPENDENT_AMBULATORY_CARE_PROVIDER_SITE_OTHER): Payer: Medicare Other | Admitting: Urology

## 2022-06-11 VITALS — BP 141/69 | HR 59 | Ht 78.0 in | Wt 200.0 lb

## 2022-06-11 DIAGNOSIS — R339 Retention of urine, unspecified: Secondary | ICD-10-CM | POA: Diagnosis not present

## 2022-06-11 DIAGNOSIS — N401 Enlarged prostate with lower urinary tract symptoms: Secondary | ICD-10-CM

## 2022-06-11 DIAGNOSIS — N138 Other obstructive and reflux uropathy: Secondary | ICD-10-CM

## 2022-06-11 LAB — BLADDER SCAN AMB NON-IMAGING: Scan Result: 220

## 2022-06-11 NOTE — Progress Notes (Signed)
post void residual =227m

## 2022-06-11 NOTE — Progress Notes (Signed)
History of Present Illness: Alan Gonzalez is here today for follow-up of BPH with retention.  At his last visit, because of worsening symptoms and increased residual urine volume, he was taught CIC, and placed on alfuzosin.  Since his last visit, he has been voiding better.  He has checked his residual urines by catheterizing before bedtime and they have been 50 mL.  He still has not had a PSA done.  He did have a nodular prostate on his initial exam.    Past Medical History:  Diagnosis Date   Actinic keratosis 10/26/2008   Qualifier: Diagnosis of  By: Andria Frames MD, [place], NOS 09/18/2006   Qualifier: Diagnosis of  By: Andria Frames MD, [place]     Diverticulitis    Diverticulitis of colon 09/18/2006   Pattern is one to two flairs per year of diverticulitis which has been easily managed by early augmentin treatment.     Elevated blood sugar 09/09/2014   ERECTILE DYSFUNCTION 10/26/2008   Qualifier: Diagnosis of  By: Andria Frames MD, [name]     Family history of hemochromatosis 09/09/2017   Father had symtomatic, lab proven hemochromatosis.  Likely needs genetic testing.     HYPERCHOLESTEROLEMIA 09/18/2006   Primary prevention: No known CAD, CVA, PVD or DM     Hyperlipidemia    Left leg cellulitis 08/21/2012   Left ventricular diastolic dysfunction with preserved systolic function 03/55/9741   class I diastolic dysfunction by echo October 2016    Palpitations 08/19/2019   Peripheral neuropathy 09/09/2014   Recurrent oral herpes simplex infection 09/09/2014   Seborrheic keratoses 08/27/2013   Thrombocytopenia ([place]) 07/26/2013   Has low normal platelet count and seems to have a qualitative platelet defect in that he bleeds very easily - previous normal work up.    Uric acid kidney stone 03/13/2015   Stone analysis done 03/2015 Uric acid stones occur in 10% of all kidney stones and are the second most-common cause of urinary stones after calcium oxalate and calcium phosphate calculi. The most  important risk factor for uric acid crystallization and stone formation is a low urine pH (below 5.5) rather than an increased urinary uric acid excretion. Main causes of low urine pH are tubular disorders   Vitamin D deficiency disease 09/09/2014    Past Surgical History:  Procedure Laterality Date   EYE SURGERY     SPINE SURGERY     1990s    Home Medications:  Allergies as of 06/11/2022   No Known Allergies      Medication List        Accurate as of June 11, 2022  8:57 AM. If you have any questions, ask your nurse or doctor.          acyclovir 800 MG tablet Commonly known as: ZOVIRAX TAKE 1 TABLET(800 MG) BY MOUTH TWICE DAILY   alfuzosin 10 MG 24 hr tablet Commonly known as: UROXATRAL Take 1 tablet (10 mg total) by mouth daily with breakfast.   amLODipine 5 MG tablet Commonly known as: NORVASC TAKE 1 TABLET(5 MG) BY MOUTH AT BEDTIME   amoxicillin-clavulanate 875-125 MG tablet Commonly known as: AUGMENTIN Take 1 tablet by mouth 2 (two) times daily as needed (Diverticulitis).   candesartan 16 MG tablet Commonly known as: ATACAND TAKE 1 TABLET BY MOUTH EVERY NIGHT AT BEDTIME   cholecalciferol 1000 units tablet Commonly known as: VITAMIN D Take 1,000 Units daily by mouth.   diazepam 5 MG tablet Commonly known as: VALIUM Take  1 tablet (5 mg total) by mouth every 12 (twelve) hours as needed for muscle spasms.   rosuvastatin 20 MG tablet Commonly known as: CRESTOR TAKE 1 TABLET(20 MG) BY MOUTH DAILY   sulfamethoxazole-trimethoprim 800-160 MG tablet Commonly known as: BACTRIM DS Take 1 tablet by mouth 2 (two) times daily.   tadalafil 20 MG tablet Commonly known as: CIALIS TAKE ONE-HALF ONE TABLET BY MOUTH EVERY OTHER DAY        Allergies: No Known Allergies  Family History  Problem Relation Age of Onset   Cancer Mother        Vulvar   COPD Father    Cancer Sister        breast   Heart disease Sister    Heart disease Brother    Thyroid  disease Sister     Social History:  reports that he has never smoked. He has never used smokeless tobacco. He reports that he does not currently use alcohol. He reports that he does not use drugs.  ROS: A complete review of systems was performed.  All systems are negative except for pertinent findings as noted.  Physical Exam:  Vital signs in last 24 hours: There were no vitals taken for this visit. Constitutional:  Alert and oriented, No acute distress Cardiovascular: Regular rate  Respiratory: Normal respiratory effort Neurologic: Grossly intact, no focal deficits Psychiatric: Normal mood and affect  I have reviewed prior pt notes  I have reviewed urinalysis results   I have reviewed prior urine culture   Impression/Assessment:  1.  BPH with retention, voiding well now on Cialis 5 mg every other day and alfuzosin  2.  Nodular prostate, suspicious for cancer  Plan:  1.  I discussed checking PSA with the patient-I strongly recommend this.  He is leaning towards not having PSA checked.  2.  I discussed with the patient proceeding with PSA, at least to see where he stands.  Last PSA 11 years ago was 2-1/2.  3.  He will drop in in 2 weeks for that blood draw-I will call with results and further follow-up

## 2022-06-12 LAB — URINALYSIS, ROUTINE W REFLEX MICROSCOPIC
Bilirubin, UA: NEGATIVE
Glucose, UA: NEGATIVE
Ketones, UA: NEGATIVE
Leukocytes,UA: NEGATIVE
Nitrite, UA: POSITIVE — AB
Specific Gravity, UA: 1.02 (ref 1.005–1.030)
Urobilinogen, Ur: 0.2 mg/dL (ref 0.2–1.0)
pH, UA: 5 (ref 5.0–7.5)

## 2022-06-12 LAB — MICROSCOPIC EXAMINATION

## 2022-06-18 ENCOUNTER — Encounter: Payer: Self-pay | Admitting: Urology

## 2022-06-18 NOTE — Telephone Encounter (Signed)
Please see pt message below.

## 2022-06-19 ENCOUNTER — Encounter: Payer: Self-pay | Admitting: Family Medicine

## 2022-06-19 DIAGNOSIS — N410 Acute prostatitis: Secondary | ICD-10-CM

## 2022-06-25 ENCOUNTER — Other Ambulatory Visit: Payer: Medicare Other

## 2022-06-25 ENCOUNTER — Ambulatory Visit: Payer: Medicare Other | Admitting: Urology

## 2022-07-09 ENCOUNTER — Other Ambulatory Visit: Payer: Medicare Other

## 2022-07-09 DIAGNOSIS — N138 Other obstructive and reflux uropathy: Secondary | ICD-10-CM | POA: Diagnosis not present

## 2022-07-09 DIAGNOSIS — N401 Enlarged prostate with lower urinary tract symptoms: Secondary | ICD-10-CM | POA: Diagnosis not present

## 2022-07-10 LAB — PSA: Prostate Specific Ag, Serum: 14.9 ng/mL — ABNORMAL HIGH (ref 0.0–4.0)

## 2022-07-11 ENCOUNTER — Telehealth: Payer: Self-pay

## 2022-07-11 NOTE — Telephone Encounter (Signed)
Patient having issues with prostate. Will need caths to last thru the holidays. Can pick up a Rx to take to a supply store or what we can supply.  Please advise.  Call back:  ASAP - [phone]  Thanks, Helene Kelp

## 2022-07-12 NOTE — Telephone Encounter (Signed)
Returned call to patient. No answer. Voicemail not set up.

## 2022-07-13 ENCOUNTER — Other Ambulatory Visit: Payer: Self-pay | Admitting: Family Medicine

## 2022-07-18 ENCOUNTER — Encounter: Payer: Self-pay | Admitting: Family Medicine

## 2022-07-18 DIAGNOSIS — N419 Inflammatory disease of prostate, unspecified: Secondary | ICD-10-CM | POA: Insufficient documentation

## 2022-07-18 MED ORDER — SULFAMETHOXAZOLE-TRIMETHOPRIM 800-160 MG PO TABS: 1.0000 | ORAL_TABLET | Freq: Two times a day (BID) | ORAL | 0 refills | Status: DC

## 2022-07-18 NOTE — Telephone Encounter (Signed)
Called patient.  Alan Gonzalez the long story and reviewed records beginning 10/31 with definite urinary obstruction possibly secondary to prostatis.  Urine cultures negative x 2.  Clinically felt much better and voiding well after 2 weeks of antibiotics.  Urology felt nodule on DRE.  PSA up but he had early symptoms of dysuria again.   Questions/concerns/plan I think acute undertreated prostatitis is plausible but not certain.   Given nodule, he needs a biopsy.  However, I am unclear if the nodule is causing his current symptoms. PSA up likely due to either prostate cancer, acute infection or both.   Given that he needs a biopsy, I want any possible infection treated prior to the biopsy to avoid precipitating bacteremia. Therefore I sent in a prescription for a full 4 week course of trimethoprim sulfa therapy. I strongly encourage him to keep his follow up appointment with urology.

## 2022-07-23 ENCOUNTER — Encounter: Payer: Self-pay | Admitting: Urology

## 2022-07-26 ENCOUNTER — Telehealth: Payer: Self-pay

## 2022-07-26 DIAGNOSIS — N138 Other obstructive and reflux uropathy: Secondary | ICD-10-CM

## 2022-07-26 DIAGNOSIS — N410 Acute prostatitis: Secondary | ICD-10-CM

## 2022-07-26 DIAGNOSIS — R972 Elevated prostate specific antigen [PSA]: Secondary | ICD-10-CM

## 2022-07-26 MED ORDER — LEVOFLOXACIN 750 MG PO TABS: 750.0000 mg | ORAL_TABLET | Freq: Once | ORAL | 0 refills | Status: AC

## 2022-07-29 NOTE — Telephone Encounter (Signed)
Call patient to scheduled biopsy. Patient is interested in having his biopsy  done in [place]. Made patient aware that I will send a message to Dr. Diona Fanti and once he response someone will reach back out to him. Patient voiced understanding

## 2022-07-30 NOTE — Telephone Encounter (Signed)
Made patient aware that his biopsy could be done in [place] and it may take a few weeks to establish care at [facility] due to him will be a new patient to the [facility] practice. Patient voiced understanding .

## 2022-08-23 DIAGNOSIS — C61 Malignant neoplasm of prostate: Secondary | ICD-10-CM | POA: Diagnosis not present

## 2022-08-23 DIAGNOSIS — R972 Elevated prostate specific antigen [PSA]: Secondary | ICD-10-CM | POA: Diagnosis not present

## 2022-08-27 ENCOUNTER — Encounter: Payer: Self-pay | Admitting: Family Medicine

## 2022-08-28 ENCOUNTER — Encounter: Payer: Self-pay | Admitting: Family Medicine

## 2022-08-28 DIAGNOSIS — C61 Malignant neoplasm of prostate: Secondary | ICD-10-CM | POA: Insufficient documentation

## 2022-08-30 ENCOUNTER — Other Ambulatory Visit: Payer: Self-pay | Admitting: Family Medicine

## 2022-08-30 DIAGNOSIS — C61 Malignant neoplasm of prostate: Secondary | ICD-10-CM

## 2022-08-30 NOTE — Progress Notes (Signed)
Urology scheduler backloged due to maternity leave.  Ordered PET scan with copy to Dr. Diona Fanti.

## 2022-09-10 ENCOUNTER — Encounter: Payer: Self-pay | Admitting: Urology

## 2022-09-10 DIAGNOSIS — K080 Exfoliation of teeth due to systemic causes: Secondary | ICD-10-CM | POA: Diagnosis not present

## 2022-09-18 ENCOUNTER — Encounter (HOSPITAL_COMMUNITY)
Admission: RE | Admit: 2022-09-18 | Discharge: 2022-09-18 | Disposition: A | Discharge status: Home / Self Care | Payer: Medicare Other | Source: Ambulatory Visit | Attending: Family Medicine | Admitting: Family Medicine

## 2022-09-18 DIAGNOSIS — K080 Exfoliation of teeth due to systemic causes: Secondary | ICD-10-CM | POA: Diagnosis not present

## 2022-09-18 DIAGNOSIS — C61 Malignant neoplasm of prostate: Secondary | ICD-10-CM | POA: Diagnosis not present

## 2022-09-18 MED ORDER — PIFLIFOLASTAT F 18 (PYLARIFY) INJECTION
9.0000 | Freq: Once | INTRAVENOUS | Status: AC
  Administered 2022-09-18: 9.3 via INTRAVENOUS

## 2022-09-20 ENCOUNTER — Encounter: Payer: Self-pay | Admitting: Family Medicine

## 2022-09-24 ENCOUNTER — Other Ambulatory Visit: Payer: Self-pay | Admitting: Radiation Oncology

## 2022-09-24 ENCOUNTER — Ambulatory Visit
Admission: RE | Admit: 2022-09-24 | Discharge: 2022-09-24 | Disposition: A | Discharge status: Home / Self Care | Payer: Self-pay | Source: Ambulatory Visit | Attending: Radiation Oncology | Admitting: Radiation Oncology

## 2022-09-24 DIAGNOSIS — R972 Elevated prostate specific antigen [PSA]: Secondary | ICD-10-CM

## 2022-10-03 NOTE — Progress Notes (Signed)
GU Location of Tumor / Histology: Prostate Ca  If Prostate Cancer, Gleason Score is (4 + 5) and PSA is (14.9 06/2022)  Biopsies      09/18/2022 Dr. Madison Hickman NM PET ([facility]) Skull to Mid Thigh CLINICAL DATA: Prostate carcinoma with biochemical recurrence.   FINDINGS: NECK No radiotracer activity in neck lymph nodes.   Incidental CT finding: None.   CHEST No radiotracer accumulation within mediastinal or hilar lymph nodes. No suspicious pulmonary nodules on the CT scan.   Incidental CT finding: None.   ABDOMEN/PELVIS   Prostate: Scattered activity within the prostate gland is nonspecific.   Lymph nodes: Intensely radiotracer avid lymph node the LEFT operator space measures 12 mm (image 216) with SUV max equal 8.5.   Liver: No evidence of liver metastasis.   Incidental CT finding: Bilateral simple fluid attenuation renal cysts.   SKELETON No radiotracer avid skeletal metastasis. No sclerotic or lytic lesions on CT imaging.   IMPRESSION: 1. Nonspecific radiotracer activity within the prostate gland. 2. Single intensely radiotracer avid prostate cancer nodal metastasis to the LEFT operator space. 3. No distant nodal metastasis. 4. No visceral metastasis or skeletal metastasis    Past/Anticipated interventions by urology, if any: NA  Past/Anticipated interventions by medical oncology, if any: NA  Weight changes, if any:  No  IPSS:  14 SHIM:  19  Bowel/Bladder complaints, if any:  No  Nausea/Vomiting, if any:  No  Pain issues, if any:  0/10  SAFETY ISSUES: Prior radiation?  No Pacemaker/ICD? No Possible current pregnancy? Male Is the patient on methotrexate? No  Current Complaints / other details:

## 2022-10-06 NOTE — Progress Notes (Signed)
Radiation Oncology         [phone] ________________________________  Initial Outpatient Consultation  Name: Alan Gonzalez MRN: [id number]  Date: 10/07/2022  DOB: 06-20-1946  CC:McDiarmid, Blane Ohara, MD  Franchot Gallo, MD   REFERRING PHYSICIAN: Franchot Gallo, MD  DIAGNOSIS: 77 y.o. gentleman with Stage T2 adenocarcinoma of the prostate with Gleason score of 4+5, and PSA of 14.9.    ICD-10-CM   1. Malignant neoplasm of prostate ([place])  C61       HISTORY OF PRESENT ILLNESS: Alan Gonzalez is a 77 y.o. male with a diagnosis of prostate cancer. He has a history of nephrolitiasis and was previously seen by Dr. Pilar Jarvis in 2019. More recently, he was seen in the ED on 05/21/22 for urinary retention and acute cystitis. He was treated with catheterization and a round of Keflex. He was seen in follow up with Dr. Diona Fanti on 05/28/22, digital rectal examination performed at that time showed two separate nodules on the right base/mid measuring 3-4 mm each. He was treated for probable prostatitis with a round of bactrim. When he returned for follow up in 06/2022, a PSA check revealed elevation at 14.9. Patient underwent another episode of urinary retention. He performed an at-home self-catheterization at the end of December. Dr. Diona Fanti did not think he cleared his original prostatitis infection and was given another course of antibiotics. After completing 30 days of his antibiotic his symptoms cleared up completely. The patient proceeded to transrectal ultrasound with 12 biopsies of the prostate on 08/23/22.  The prostate volume measured 63 cc.  Out of 12 core biopsies, all 12 were positive.  The maximum Gleason score was 4+5, and this was seen in right base. Additionally, Gleason 3+5 was seen in right mid (with perineural invasion) and Gleason 4+4 in left mid and left mid lateral. The remaining cores showed Gleason 7 disease.  He underwent staging PSMA PET scan on 09/18/22 showing:  single intensely radiotracer-avid nodal metastasis to left opturator space; no distant nodal, visceral, or skeletal metastasis.  The patient reviewed the biopsy results with his urologist and he has kindly been referred today for discussion of potential radiation treatment options.   PREVIOUS RADIATION THERAPY: No  PAST MEDICAL HISTORY:  Past Medical History:  Diagnosis Date   Actinic keratosis 10/26/2008   Qualifier: Diagnosis of  By: Andria Frames MD, [place], NOS 09/18/2006   Qualifier: Diagnosis of  By: Andria Frames MD, Jeff     Diverticulitis    Diverticulitis of colon 09/18/2006   Pattern is one to two flairs per year of diverticulitis which has been easily managed by early augmentin treatment.     Elevated blood sugar 09/09/2014   ERECTILE DYSFUNCTION 10/26/2008   Qualifier: Diagnosis of  By: Andria Frames MD, [name]     Family history of hemochromatosis 09/09/2017   Father had symtomatic, lab proven hemochromatosis.  Likely needs genetic testing.     HYPERCHOLESTEROLEMIA 09/18/2006   Primary prevention: No known CAD, CVA, PVD or DM     Hyperlipidemia    Left leg cellulitis 08/21/2012   Left ventricular diastolic dysfunction with preserved systolic function [id number]   class I diastolic dysfunction by echo October 2016    Palpitations 08/19/2019   Peripheral neuropathy 09/09/2014   Recurrent oral herpes simplex infection 09/09/2014   Seborrheic keratoses 08/27/2013   Thrombocytopenia ([place]) 07/26/2013   Has low normal platelet count and seems to have a qualitative platelet defect in that he bleeds very  easily - previous normal work up.    Uric acid kidney stone 03/13/2015   Stone analysis done 03/2015 Uric acid stones occur in 10% of all kidney stones and are the second most-common cause of urinary stones after calcium oxalate and calcium phosphate calculi. The most important risk factor for uric acid crystallization and stone formation is a low urine pH (below 5.5) rather than an  increased urinary uric acid excretion. Main causes of low urine pH are tubular disorders   Vitamin D deficiency disease 09/09/2014      PAST SURGICAL HISTORY: Past Surgical History:  Procedure Laterality Date   EYE SURGERY     SPINE SURGERY     1990s    FAMILY HISTORY:  Family History  Problem Relation Age of Onset   Cancer Mother        Vulvar   COPD Father    Cancer Sister        breast   Heart disease Sister    Heart disease Brother    Thyroid disease Sister     SOCIAL HISTORY:  Social History   Socioeconomic History   Marital status: Married    Spouse name: [name]   Number of children: 3   Years of education: Gonzalez   Highest education level: Not on file  Occupational History   Occupation: Gonzalez-physcologist     Employer: [employer]  Tobacco Use   Smoking status: Never   Smokeless tobacco: Never  Vaping Use   Vaping Use: Never used  Substance and Sexual Activity   Alcohol use: Not Currently   Drug use: No   Sexual activity: Yes    Comment: monagomous  Other Topics Concern   Not on file  Social History Narrative   Emergency Contact: wife, [name], [phone]   Diet: Pt has a varied diet of protein, starch, and vegetables/fruits   Seatbelts: Pt reports wearing seatbelt when in vehicles.    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/23/2020   Interests / Fun: Golf, travel, keep busy, pickle ball 04/19/2021   Current Stressors: Minor life hassles 03/23/2020   Religious / Personal Beliefs: Catholic [id number]                                                                                                    Social Determinants of Health   Financial Resource Strain: Low Risk  (04/19/2021)   Overall Financial Resource Strain (CARDIA)    Difficulty of Paying Living Expenses: Not hard at all  Food Insecurity: No Food Insecurity (10/07/2022)   Hunger Vital Sign    Worried About Running Out of Food in the Last Year: Never true    Ran Out of Food in the Last Year: Never true  Transportation Needs: No Transportation Needs (10/07/2022)   PRAPARE - Hydrologist (Medical): No    Lack of Transportation (Non-Medical): No  Physical Activity: Sufficiently Active (04/19/2021)   Exercise Vital Sign    Days of Exercise per Week: 5 days    Minutes of Exercise per Session: 60 min  Stress: No Stress Concern Present (04/19/2021)   [place]    Feeling of Stress : Only a little  Social Connections: Socially Integrated (04/19/2021)   Social Connection and Isolation Panel [NHANES]    Frequency of Communication with Friends and Family: More than three times a week    Frequency of Social Gatherings with Friends and Family: More than three times a week    Attends Religious Services: 1 to 4 times per year    Active Member of [employer] or Organizations: Yes    Attends Music therapist: More than 4 times per year    Marital Status: Married  Human resources officer Violence: Not At Risk (10/07/2022)   Humiliation, Afraid, Rape, and Kick questionnaire    Fear of Current or Ex-Partner: No    Emotionally Abused: No    Physically Abused: No    Sexually Abused: No    ALLERGIES: Patient has no known allergies.  MEDICATIONS:  Current Outpatient Medications  Medication Sig Dispense Refill   arginine 500 MG tablet Take 1,200 mg by mouth daily.     CINNAMON PO Take by mouth.     TURMERIC PO Take 1,000 mg by mouth daily.     acyclovir (ZOVIRAX) 800 MG tablet TAKE 1 TABLET(800 MG) BY MOUTH TWICE DAILY 20 tablet 11   alfuzosin  (UROXATRAL) 10 MG 24 hr tablet Take 1 tablet (10 mg total) by mouth daily with breakfast. 30 tablet 11   amLODipine (NORVASC) 5 MG tablet TAKE 1 TABLET(5 MG) BY MOUTH AT BEDTIME 90 tablet 3   amoxicillin-clavulanate (AUGMENTIN) 875-125 MG tablet Take 1 tablet by mouth 2 (two) times daily as needed (Diverticulitis). 20 tablet 3   cholecalciferol (VITAMIN D) 1000 units tablet Take 1,000 Units daily by mouth.     diazepam (VALIUM) 5 MG tablet Take 1 tablet (5 mg total) by mouth every 12 (twelve) hours as needed for muscle spasms. 30 tablet 1   rosuvastatin (CRESTOR) 20 MG tablet TAKE 1 TABLET(20 MG) BY MOUTH DAILY 90 tablet 3   tadalafil (CIALIS) 20 MG tablet TAKE ONE-HALF ONE TABLET BY MOUTH EVERY OTHER DAY 5 tablet prn   No current facility-administered medications for this encounter.    REVIEW OF SYSTEMS:  On review of systems, the patient reports that he is doing well overall. He denies any chest pain, shortness of breath, cough, fevers, chills, night sweats, unintended weight changes. He denies any bowel disturbances, and denies abdominal pain, nausea or vomiting. He denies any new musculoskeletal or joint aches or pains. His IPSS was 14, indicating moderate urinary symptoms. His SHIM was 19, indicating he does have mild erectile dysfunction. A complete review of systems is obtained and is otherwise negative.    PHYSICAL EXAM:  Wt Readings from Last 3 Encounters:  10/07/22 206 lb 9.6 oz (93.7 kg)  06/11/22 200 lb (90.7 kg)  05/28/22 197 lb (89.4 kg)   Temp Readings from Last 3 Encounters:  10/07/22 97.6 F (36.4 C)  05/21/22 99.6 F (37.6 C) (Oral)  09/10/18 97.7 F (36.5 C) (Oral)   BP Readings from Last 3 Encounters:  10/07/22 (!) 155/78  06/11/22 (!) 141/69  06/04/22 (!) 152/88   Pulse Readings from Last 3 Encounters:  10/07/22 (!) 50  06/11/22 (!) 59  06/04/22 (!) 54   Pain Assessment Pain Score: 0-No pain/10  In general this is a well appearing male in no acute  distress. He's alert and oriented x4 and appropriate throughout the examination. Cardiopulmonary assessment is negative for acute distress, and he exhibits normal effort.     KPS = 100  100 - Normal; no complaints; no evidence of disease. 90   - Able to carry on normal activity; minor signs or symptoms of disease. 80   - Normal activity with effort; some signs or symptoms of disease. 69   - Cares for self; unable to carry on normal activity or to do active work. 60   - Requires occasional assistance, but is able to care for most of his personal needs. 50   - Requires considerable assistance and frequent medical care. 51   - Disabled; requires special care and assistance. 46   - Severely disabled; hospital admission is indicated although death not imminent. 61   - Very sick; hospital admission necessary; active supportive treatment necessary. 10   - Moribund; fatal processes progressing rapidly. 0     - Dead  Karnofsky DA, Abelmann [place], Craver LS and Burchenal JH [phone]) The use of the nitrogen mustards in the palliative treatment of carcinoma: with particular reference to bronchogenic carcinoma Cancer 1 634-56  LABORATORY DATA:  Lab Results  Component Value Date   WBC 10.8 (H) 05/21/2022   HGB 15.0 05/21/2022   HCT 44.3 05/21/2022   MCV 94.3 05/21/2022   PLT 147 (L) 05/21/2022   Lab Results  Component Value Date   NA 139 05/21/2022   K 4.1 05/21/2022   CL 106 05/21/2022   CO2 23 05/21/2022   Lab Results  Component Value Date   ALT 18 09/26/2021   AST 26 09/26/2021   ALKPHOS 76 09/26/2021   BILITOT 0.6 09/26/2021     RADIOGRAPHY: NM PET (PSMA) SKULL TO MID THIGH  Result Date: 09/19/2022 CLINICAL DATA:  Prostate carcinoma with biochemical recurrence. EXAM: NUCLEAR MEDICINE PET SKULL BASE TO THIGH TECHNIQUE: mCi F18 Piflufolastat (Pylarify) was injected intravenously. Full-ring PET imaging was performed from the skull base to thigh after the radiotracer. CT data was obtained  and used for attenuation correction and anatomic localization. COMPARISON:  None Available. FINDINGS: NECK No radiotracer activity in neck lymph nodes. Incidental CT finding: None. CHEST No radiotracer accumulation within mediastinal or hilar lymph nodes. No suspicious pulmonary nodules on the CT scan. Incidental CT finding: None. ABDOMEN/PELVIS Prostate: Scattered activity within the prostate gland is nonspecific. Lymph nodes: Intensely radiotracer avid lymph node the LEFT operator space measures 12 mm (image 216) with SUV max equal 8.5. Liver: No evidence of liver metastasis. Incidental CT finding: Bilateral simple fluid attenuation renal cysts. SKELETON No radiotracer avid skeletal metastasis. No sclerotic or lytic lesions on CT imaging. IMPRESSION: 1. Nonspecific radiotracer activity within the prostate gland. 2. Single intensely radiotracer avid prostate cancer nodal metastasis to the LEFT operator space. 3. No distant nodal metastasis. 4. No visceral metastasis or skeletal metastasis Electronically Signed   By: Suzy Bouchard M.D.   On: 09/19/2022 13:54      IMPRESSION/PLAN: 1. 77 y.o. gentleman with Stage T2 adenocarcinoma of the prostate with Gleason Score of 4+5, and PSA of 14.9. We discussed the patient's workup and outlined the nature of prostate cancer  in this setting. The patient's T stage, Gleason's score, and PSA put him into the high risk group. Accordingly, he is eligible for a variety of potential treatment options including LT-ADT in combination with 8 weeks of external radiation, 5 weeks of external radiation with an upfront brachytherapy boost, or prostatectomy. We discussed the available radiation techniques, and focused on the details and logistics of delivery. We discussed that even with a larger prostate size of 63 cc, ADT should allow for prostate downsizing prior to initiating brachytherapy. We discussed and outlined the risks, benefits, short and long-term effects associated with  radiotherapy and compared and contrasted these with prostatectomy. We discussed the role of SpaceOAR gel in reducing the rectal toxicity associated with radiotherapy. We also detailed the role of ADT in the treatment of high risk prostate cancer and outlined the associated side effects that could be expected with this therapy. He appears to have a good understanding of his disease and our treatment recommendations which are of curative intent.  He was encouraged to ask questions that were answered to his stated satisfaction.  Although his IPSS indicates moderate urinary symptoms, patient states he is only getting up 1-2 times per night with no issues with urgency, hesitancy, or straining. Since completing his second course of antibiotics for prostatitis, he has been experiencing mild urinary symptoms. He remains a good candidate for ADT with brachytherapy followed by 5 weeks of external beam radiotherapy.   At the conclusion of our conversation, the patient remains undecided on his choice of treatment. He would like to take time to think about his options before making a decision. Patient is concerned about the possible side effects from ADT and how they may affect his current, active lifestyle. Today we discussed at length the benefits and risks of ADT. He understands that there is a lower cure rate associated with patients with high risk prostate cancer who do not undergo ADT. He will call our nurse navigator, Kathlee Nations, when he has made his decision.   We personally spent 70 minutes in this encounter including chart review, reviewing radiological studies, meeting face-to-face with the patient, entering orders and completing documentation.     Leona Singleton, PA-C    Tyler Pita, MD  [facility] Direct Dial: [phone]  Fax: [phone] [email or web address]  Skype  LinkedIn   This document serves as a record of services personally performed by Tyler Pita, MD and Leona Singleton,  PA-C. It was created on their behalf by Wilburn Mylar, a trained medical scribe. The creation of this record is based on the scribe's personal observations and the provider's statements to them. This document has been checked and approved by the attending provider.

## 2022-10-07 ENCOUNTER — Encounter: Payer: Self-pay | Admitting: Radiation Oncology

## 2022-10-07 ENCOUNTER — Other Ambulatory Visit: Payer: Self-pay

## 2022-10-07 ENCOUNTER — Ambulatory Visit
Admission: RE | Admit: 2022-10-07 | Discharge: 2022-10-07 | Disposition: A | Discharge status: Home / Self Care | Payer: Medicare Other | Source: Ambulatory Visit | Attending: Radiation Oncology | Admitting: Radiation Oncology

## 2022-10-07 VITALS — BP 155/78 | HR 50 | Temp 97.6°F | Resp 18 | Ht 77.0 in | Wt 206.6 lb

## 2022-10-07 DIAGNOSIS — Z923 Personal history of irradiation: Secondary | ICD-10-CM | POA: Diagnosis not present

## 2022-10-07 DIAGNOSIS — Z191 Hormone sensitive malignancy status: Secondary | ICD-10-CM | POA: Diagnosis not present

## 2022-10-07 DIAGNOSIS — E7800 Pure hypercholesterolemia, unspecified: Secondary | ICD-10-CM | POA: Insufficient documentation

## 2022-10-07 DIAGNOSIS — R339 Retention of urine, unspecified: Secondary | ICD-10-CM | POA: Diagnosis not present

## 2022-10-07 DIAGNOSIS — Z79899 Other long term (current) drug therapy: Secondary | ICD-10-CM | POA: Insufficient documentation

## 2022-10-07 DIAGNOSIS — N281 Cyst of kidney, acquired: Secondary | ICD-10-CM | POA: Insufficient documentation

## 2022-10-07 DIAGNOSIS — C61 Malignant neoplasm of prostate: Secondary | ICD-10-CM | POA: Insufficient documentation

## 2022-10-07 NOTE — Progress Notes (Signed)
Introduced myself to the patient, and his wife, as the prostate nurse navigator.  No barriers to care identified at this time.  He is here to discuss his radiation treatment options.  I gave him my business card and asked him to call me with questions or concerns.  Verbalized understanding.  

## 2022-10-08 DIAGNOSIS — Z191 Hormone sensitive malignancy status: Secondary | ICD-10-CM | POA: Diagnosis not present

## 2022-10-08 DIAGNOSIS — C61 Malignant neoplasm of prostate: Secondary | ICD-10-CM | POA: Diagnosis not present

## 2022-10-10 NOTE — Progress Notes (Signed)
Patient was a RadOnc Consult on 3/18 for his stage T2 adenocarcinoma of the prostate with Gleason Score of 4+5, and PSA of 14.9.     Patient has decided to proceed with LT-ADT, brachy boost, followed by daily radiation.   MD's notified.  Will work on obtaining patient an appointment @ [facility] to start ADT.    Plan of care in progress.

## 2022-10-11 ENCOUNTER — Telehealth: Payer: Self-pay | Admitting: Radiation Oncology

## 2022-10-11 NOTE — Progress Notes (Signed)
Patient is scheduled at [facility] for ADT with Dr. Diona Fanti on 4/12.   RN reached out to [facility] to review if appointment can be earlier.  Nurse @ [facility] will review and contact patient with new appointment time and date.    RN updated patient.  Plan of care in progress.

## 2022-10-11 NOTE — Telephone Encounter (Signed)
Called patient to inform him of his appointment with Dr. Diona Fanti - scheduled for 11/01/22 at 10:15 am - no answer, lvm for return call.

## 2022-10-15 ENCOUNTER — Ambulatory Visit: Payer: Medicare Other | Admitting: Family Medicine

## 2022-10-15 NOTE — Progress Notes (Signed)
Patient is scheduled to see Dr. Diona Fanti on 3/27 to discuss starting ADT.   Patient aware of appointment time and date.   Plan of care in progress.

## 2022-10-16 DIAGNOSIS — C775 Secondary and unspecified malignant neoplasm of intrapelvic lymph nodes: Secondary | ICD-10-CM | POA: Diagnosis not present

## 2022-10-16 DIAGNOSIS — C61 Malignant neoplasm of prostate: Secondary | ICD-10-CM | POA: Diagnosis not present

## 2022-10-17 NOTE — Progress Notes (Signed)
Patient saw Dr. Diona Fanti on 3/27 and started Orgovyx and will follow up on 5/1.    Patient aware that brachytherapy will be scheduled and he will be notified.

## 2022-10-22 ENCOUNTER — Encounter: Payer: Self-pay | Admitting: Family Medicine

## 2022-10-22 ENCOUNTER — Ambulatory Visit (INDEPENDENT_AMBULATORY_CARE_PROVIDER_SITE_OTHER): Payer: Medicare Other | Admitting: Family Medicine

## 2022-10-22 VITALS — BP 155/70 | HR 55 | Temp 97.5°F | Ht 77.0 in | Wt 202.3 lb

## 2022-10-22 DIAGNOSIS — N529 Male erectile dysfunction, unspecified: Secondary | ICD-10-CM

## 2022-10-22 DIAGNOSIS — G8929 Other chronic pain: Secondary | ICD-10-CM

## 2022-10-22 DIAGNOSIS — C61 Malignant neoplasm of prostate: Secondary | ICD-10-CM | POA: Diagnosis not present

## 2022-10-22 DIAGNOSIS — M545 Low back pain, unspecified: Secondary | ICD-10-CM | POA: Diagnosis not present

## 2022-10-22 DIAGNOSIS — R739 Hyperglycemia, unspecified: Secondary | ICD-10-CM | POA: Diagnosis not present

## 2022-10-22 DIAGNOSIS — E785 Hyperlipidemia, unspecified: Secondary | ICD-10-CM

## 2022-10-22 DIAGNOSIS — K5732 Diverticulitis of large intestine without perforation or abscess without bleeding: Secondary | ICD-10-CM

## 2022-10-22 DIAGNOSIS — E559 Vitamin D deficiency, unspecified: Secondary | ICD-10-CM | POA: Diagnosis not present

## 2022-10-22 DIAGNOSIS — N200 Calculus of kidney: Secondary | ICD-10-CM

## 2022-10-22 DIAGNOSIS — I10 Essential (primary) hypertension: Secondary | ICD-10-CM

## 2022-10-22 DIAGNOSIS — E7800 Pure hypercholesterolemia, unspecified: Secondary | ICD-10-CM

## 2022-10-22 LAB — LIPID PANEL
Cholesterol: 162 mg/dL (ref 0–200)
HDL: 72.7 mg/dL (ref >39.00)
LDL Cholesterol: 80 mg/dL (ref 0–99)
NonHDL: 89.19
Total CHOL/HDL Ratio: 2
Triglycerides: 45 mg/dL (ref 0.0–149.0)
VLDL: 9 mg/dL (ref 0.0–40.0)

## 2022-10-22 LAB — COMPREHENSIVE METABOLIC PANEL
ALT: 22 U/L (ref 0–53)
AST: 28 U/L (ref 0–37)
Albumin: 4.5 g/dL (ref 3.5–5.2)
Alkaline Phosphatase: 72 U/L (ref 39–117)
BUN: 23 mg/dL (ref 6–23)
CO2: 28 mEq/L (ref 19–32)
Calcium: 9.5 mg/dL (ref 8.4–10.5)
Chloride: 107 mEq/L (ref 96–112)
Creatinine, Ser: 1.21 mg/dL (ref 0.40–1.50)
GFR: 58.48 mL/min — ABNORMAL LOW (ref >60.00)
Glucose, Bld: 101 mg/dL — ABNORMAL HIGH (ref 70–99)
Potassium: 4.5 mEq/L (ref 3.5–5.1)
Sodium: 141 mEq/L (ref 135–145)
Total Bilirubin: 0.6 mg/dL (ref 0.2–1.2)
Total Protein: 7.1 g/dL (ref 6.0–8.3)

## 2022-10-22 LAB — CBC
HCT: 45.5 % (ref 39.0–52.0)
Hemoglobin: 15 g/dL (ref 13.0–17.0)
MCHC: 32.8 g/dL (ref 30.0–36.0)
MCV: 94.7 fl (ref 78.0–100.0)
Platelets: 144 10*3/uL — ABNORMAL LOW (ref 150.0–400.0)
RBC: 4.81 Mil/uL (ref 4.22–5.81)
RDW: 13.8 % (ref 11.5–15.5)
WBC: 5.4 10*3/uL (ref 4.0–10.5)

## 2022-10-22 LAB — HEMOGLOBIN A1C: Hgb A1c MFr Bld: 6 % (ref 4.6–6.5)

## 2022-10-22 LAB — VITAMIN D 25 HYDROXY (VIT D DEFICIENCY, FRACTURES): VITD: 42.57 ng/mL (ref 30.00–100.00)

## 2022-10-22 LAB — TSH: TSH: 1.34 u[IU]/mL (ref 0.35–5.50)

## 2022-10-22 MED ORDER — TADALAFIL 5 MG PO TABS: 5.0000 mg | ORAL_TABLET | ORAL | Status: AC

## 2022-10-22 MED ORDER — DIAZEPAM 5 MG PO TABS: 5.0000 mg | ORAL_TABLET | Freq: Two times a day (BID) | ORAL | 1 refills | Status: AC | PRN

## 2022-10-22 MED ORDER — AMOXICILLIN-POT CLAVULANATE 875-125 MG PO TABS: 1.0000 | ORAL_TABLET | Freq: Two times a day (BID) | ORAL | 3 refills | Status: DC | PRN

## 2022-10-22 NOTE — Assessment & Plan Note (Signed)
Uses Valium very infrequently as needed for spasms.  Last refill was a few years ago.  Will give paper prescription today and we can refill as needed.  He has been tolerating it very well.

## 2022-10-22 NOTE — Assessment & Plan Note (Signed)
Elevated today though home readings have all been at goal.  He will continue to monitor at home.  Continue Norvasc 5 mg daily.  He will let us know if persistently elevated at home.

## 2022-10-22 NOTE — Patient Instructions (Signed)
It was very nice to see you today!  We will check blood work today.  Please keep up the great work with your diet and exercise.  It is okay for you to take 1200 mg of calcium daily.  We will see back in year.  Please back sooner if needed.  Take care, Dr Jerline Pain  PLEASE NOTE:  If you had any lab tests, please let us know if you have not heard back within a few days. You may see your results on mychart before we have a chance to review them but we will give you a call once they are reviewed by [place].   If we ordered any referrals today, please let us know if you have not heard from their office within the next week.   If you had any urgent prescriptions sent in today, please check with the pharmacy within an hour of our visit to make sure the prescription was transmitted appropriately.   Please try these tips to maintain a healthy lifestyle:  Eat at least 3 REAL meals and 1-2 snacks per day.  Aim for no more than 5 hours between eating.  If you eat breakfast, please do so within one hour of getting up.   Each meal should contain half fruits/vegetables, one quarter protein, and one quarter carbs (no bigger than a computer mouse)  Cut down on sweet beverages. This includes juice, soda, and sweet tea.   Drink at least 1 glass of water with each meal and aim for at least 8 glasses per day  Exercise at least 150 minutes every week.

## 2022-10-22 NOTE — Progress Notes (Signed)
Summary office visit with Dr Diona Fanti ([place]) on 10/16/22  Patient to start LT ADT in advcance combination radiotherapy for high risk prostated cancer  08/23/22 RUS/biopsy: All 12 biopsy with adenocarcinoma, maximum Gleason 4+5  09/18/22 PSMA PET scab  - single intense uptake nodal mets to left obturator space.  No distal nodal, visceral, skeletal mets.   Plan  Schedule Brachytherapy I-125 seed followed by XRT Start Longterm Androgen Deprivation Therapy 10/16/22 F/U one month w Dr Diona Fanti

## 2022-10-22 NOTE — Assessment & Plan Note (Signed)
Following with urology.  On Cialis 5 mg every other day for his lower urinary tract symptoms though uses full dose prior to intercourse which works well.

## 2022-10-22 NOTE — Assessment & Plan Note (Signed)
Check vitamin D.  He is on 1000 IUs daily.  Now that he is under testosterone suppression we will need to make sure he is getting adequate vitamin D intake for bone health.

## 2022-10-22 NOTE — Assessment & Plan Note (Signed)
Following with urology and radiation oncology.  Currently on orgovyx.

## 2022-10-22 NOTE — Assessment & Plan Note (Signed)
He is doing a great job with diet and exercise.  Will check A1c today.

## 2022-10-22 NOTE — Assessment & Plan Note (Signed)
No recent flares.  Uses Augmentin as needed for flares.  His previous PCP prescribed pocket prescription for this.  Will refill today.

## 2022-10-22 NOTE — Progress Notes (Signed)
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  He is a new patient.   Assessment/Plan:  Chronic Problems Addressed Today: Prostate cancer [facility]) Following with urology and radiation oncology.  Currently on orgovyx.   Dyslipidemia On Crestor 10 mg daily.  Tolerating well.  Check lipids today.   Diverticulitis of colon No recent flares.  Uses Augmentin as needed for flares.  His previous PCP prescribed pocket prescription for this.  Will refill today.  ERECTILE DYSFUNCTION Following with urology.  On Cialis 5 mg every other day for his lower urinary tract symptoms though uses full dose prior to intercourse which works well.   Chronic bilateral low back pain without sciatica Uses Valium very infrequently as needed for spasms.  Last refill was a few years ago.  Will give paper prescription today and we can refill as needed.  He has been tolerating it very well.  Vitamin D deficiency disease Check vitamin D.  He is on 1000 IUs daily.  Now that he is under testosterone suppression we will need to make sure he is getting adequate vitamin D intake for bone health.  Elevated blood sugar He is doing a great job with diet and exercise.  Will check A1c today.  Hypertension, essential Elevated today though home readings have all been at goal.  He will continue to monitor at home.  Continue Norvasc 5 mg daily.  He will let us know if persistently elevated at home.     Subjective:  HPI:  See A/p for status of chronic conditions.   He was diagnosed with prostate cancer several months ago with involvement in a single lymph node. He has been following with urology and oncology for this. He was recent started on orgovyx. He is having some side effects with this but this is overall manageable at this time.  He will be undergoing radiation and seed implantation soon.   Otherwise he feels like he is doing very well.  His chronic medical conditions have been  well-controlled.  He is very active and does a lot of walking.  Routinely plays golf and pickleball.  He monitors blood pressure at home and is routinely in the 130s over 70s.  He has been compliant with his medications without any significant side effects.  ROS: Per HPI, otherwise a complete review of systems was negative.   PMH:  The following were reviewed and entered/updated in epic: Past Medical History:  Diagnosis Date   Actinic keratosis 10/26/2008   Qualifier: Diagnosis of  By: Andria Frames MD, [place], NOS 09/18/2006   Qualifier: Diagnosis of  By: Andria Frames MD, [place]     Diverticulitis    Diverticulitis of colon 09/18/2006   Pattern is one to two flairs per year of diverticulitis which has been easily managed by early augmentin treatment.     Elevated blood sugar 09/09/2014   ERECTILE DYSFUNCTION 10/26/2008   Qualifier: Diagnosis of  By: Andria Frames MD, [name]     Family history of hemochromatosis 09/09/2017   Father had symtomatic, lab proven hemochromatosis.  Likely needs genetic testing.     HYPERCHOLESTEROLEMIA 09/18/2006   Primary prevention: No known CAD, CVA, PVD or DM     Hyperlipidemia    Left leg cellulitis 08/21/2012   Left ventricular diastolic dysfunction with preserved systolic function 123XX123   class I diastolic dysfunction by echo October 2016    Palpitations 08/19/2019   Peripheral neuropathy 09/09/2014  Recurrent oral herpes simplex infection 09/09/2014   Seborrheic keratoses 08/27/2013   Thrombocytopenia 07/26/2013   Has low normal platelet count and seems to have a qualitative platelet defect in that he bleeds very easily - previous normal work up.    Uric acid kidney stone 03/13/2015   Stone analysis done 03/2015 Uric acid stones occur in 10% of all kidney stones and are the second most-common cause of urinary stones after calcium oxalate and calcium phosphate calculi. The most important risk factor for uric acid crystallization and stone  formation is a low urine pH (below 5.5) rather than an increased urinary uric acid excretion. Main causes of low urine pH are tubular disorders   Vitamin D deficiency disease 09/09/2014   Patient Active Problem List   Diagnosis Date Noted   Prostate cancer 08/28/2022   Bradycardia 10/02/2021   Decreased hearing of both ears 04/16/2021   Family history of hemochromatosis 09/09/2017   Left ventricular diastolic dysfunction with preserved systolic function 123XX123   Hypertension, essential 05/16/2015   Uric acid kidney stone 03/13/2015   Cold sore 09/09/2014   Peripheral neuropathy 09/09/2014   Vitamin D deficiency disease 09/09/2014   Elevated blood sugar 09/09/2014   Seborrheic keratoses 08/27/2013   Thrombocytopenia ([place]) 07/26/2013   ERECTILE DYSFUNCTION 10/26/2008   ACTINIC KERATOSIS 10/26/2008   Dyslipidemia 09/18/2006   Diverticulitis of colon 09/18/2006   Musculoskeletal disorder and symptoms referable to neck 09/18/2006   Chronic bilateral low back pain without sciatica 09/18/2006   Past Surgical History:  Procedure Laterality Date   EYE SURGERY     SPINE SURGERY     1990s    Family History  Problem Relation Age of Onset   Cancer Mother        Vulvar   COPD Father    Cancer Sister        breast   Heart disease Sister    Heart disease Brother    Thyroid disease Sister     Medications- reviewed and updated Current Outpatient Medications  Medication Sig Dispense Refill   acyclovir (ZOVIRAX) 800 MG tablet TAKE 1 TABLET(800 MG) BY MOUTH TWICE DAILY 20 tablet 11   alfuzosin (UROXATRAL) 10 MG 24 hr tablet Take 1 tablet (10 mg total) by mouth daily with breakfast. 30 tablet 11   amLODipine (NORVASC) 5 MG tablet TAKE 1 TABLET(5 MG) BY MOUTH AT BEDTIME 90 tablet 3   arginine 500 MG tablet Take 1,200 mg by mouth daily.     cholecalciferol (VITAMIN D) 1000 units tablet Take 1,000 Units daily by mouth.     CINNAMON PO Take by mouth.     relugolix (ORGOVYX) 120 MG  tablet Take 120 mg by mouth daily.     rosuvastatin (CRESTOR) 20 MG tablet TAKE 1 TABLET(20 MG) BY MOUTH DAILY 90 tablet 3   TURMERIC PO Take 1,000 mg by mouth daily.     amoxicillin-clavulanate (AUGMENTIN) 875-125 MG tablet Take 1 tablet by mouth 2 (two) times daily as needed (Diverticulitis). 20 tablet 3   diazepam (VALIUM) 5 MG tablet Take 1 tablet (5 mg total) by mouth every 12 (twelve) hours as needed for muscle spasms. 30 tablet 1   tadalafil (CIALIS) 5 MG tablet Take 1 tablet (5 mg total) by mouth every other day.     No current facility-administered medications for this visit.    Allergies-reviewed and updated No Known Allergies  Social History   Socioeconomic History   Marital status: Married    Spouse name:  [name]   Number of children: 3   Years of education: PHD   Highest education level: Not on file  Occupational History   Occupation: PHD-physcologist     Employer: [employer]  Tobacco Use   Smoking status: Never   Smokeless tobacco: Never  Vaping Use   Vaping Use: Never used  Substance and Sexual Activity   Alcohol use: Not Currently   Drug use: No   Sexual activity: Yes    Comment: monagomous  Other Topics Concern   Not on file  Social History Narrative   Emergency Contact: wife, [name] [phone]   Diet: Pt has a varied diet of protein, starch, and vegetables/fruits   Seatbelts: Pt reports wearing seatbelt when in vehicles.    Sun Exposure/Protection: Pt reports wearing ball cap, long sleeve   Hobbies: golf, exercise, outdoors, reading       Patient lives with wife [name]) in two level home 03/23/2020   Transportation: Patient has own vehicle and drives himself [id number]   Important Relationships Spouse, kids, grandkids, friends  03/23/2020   Pets: None 03/23/2020   Education / Work:  PhD/ Psychologist 03/23/2020   Interests / Fun: Golf, travel, keep busy, pickle ball 04/19/2021   Current Stressors: Minor life hassles 03/23/2020    Religious / Personal Beliefs: Catholic [id number]                                                                                                   Social Determinants of Health   Financial Resource Strain: Low Risk  (04/19/2021)   Overall Financial Resource Strain (CARDIA)    Difficulty of Paying Living Expenses: Not hard at all  Food Insecurity: No Food Insecurity (10/07/2022)   Hunger Vital Sign    Worried About Running Out of Food in the Last Year: Never true    [place] in the Last Year: Never true  Transportation Needs: No Transportation Needs (10/07/2022)   PRAPARE - Hydrologist (Medical): No    Lack of Transportation (Non-Medical): No  Physical Activity: Sufficiently Active (04/19/2021)   Exercise Vital Sign    Days of Exercise per Week: 5 days    Minutes of Exercise per Session: 60 min  Stress: No Stress Concern Present (04/19/2021)   [place]    Feeling of Stress : Only a little  Social Connections: Socially Integrated (04/19/2021)   Social Connection and Isolation Panel [NHANES]    Frequency of Communication with Friends and Family: More than three times a week    Frequency of Social Gatherings with Friends and Family: More than three times a week    Attends Religious Services: 1 to 4 times per year    Active Member of [employer] or Organizations: Yes    Attends Music therapist: More than 4 times per year    Marital Status: Married           Objective:  Physical Exam: BP (!) 155/70   Pulse (!) 55   Temp (!) 97.5  F (36.4 C) (Temporal)   Ht 6\' 5"  (1.956 m)   Wt 202 lb 4.8 oz (91.8 kg)   SpO2 96%   BMI 23.99 kg/m   Gen: No acute distress, resting comfortably CV: Regular rate and rhythm with no murmurs appreciated Pulm: Normal work of breathing, clear to auscultation bilaterally with no crackles, wheezes, or rhonchi Neuro: Grossly normal, moves all  extremities Psych: Normal affect and thought content      Norely Schlick M. Jerline Pain, MD 10/22/2022 10:49 AM

## 2022-10-22 NOTE — Assessment & Plan Note (Signed)
On Crestor 10 mg daily.  Tolerating well.  Check lipids today.

## 2022-10-24 NOTE — Progress Notes (Signed)
Please inform patient of the following:  His A1c is slightly elevated into the borderline range.   His cholesterol levels are at goal.  The rest of his labs are all stable compared to previous.   Do not need to make any adjustments to his treatment plan at this time.  He should continue to work on diet and exercise and we can recheck everything in a year or so.

## 2022-11-04 DIAGNOSIS — L905 Scar conditions and fibrosis of skin: Secondary | ICD-10-CM | POA: Diagnosis not present

## 2022-11-04 DIAGNOSIS — Z85828 Personal history of other malignant neoplasm of skin: Secondary | ICD-10-CM | POA: Diagnosis not present

## 2022-11-04 DIAGNOSIS — C44712 Basal cell carcinoma of skin of right lower limb, including hip: Secondary | ICD-10-CM | POA: Diagnosis not present

## 2022-11-04 DIAGNOSIS — D2261 Melanocytic nevi of right upper limb, including shoulder: Secondary | ICD-10-CM | POA: Diagnosis not present

## 2022-11-04 DIAGNOSIS — D225 Melanocytic nevi of trunk: Secondary | ICD-10-CM | POA: Diagnosis not present

## 2022-11-05 ENCOUNTER — Other Ambulatory Visit: Payer: Self-pay | Admitting: Urology

## 2022-11-07 ENCOUNTER — Encounter: Payer: Self-pay | Admitting: Family Medicine

## 2022-11-11 NOTE — Telephone Encounter (Signed)
See note

## 2022-11-12 NOTE — Telephone Encounter (Signed)
There is nothing wrong with getting the information.If it will help future family members then I think that is a good enough reason to check.  He can come here to have this checked if he wishes.  Ok to place future order for lipoprotein A.   Katina Degree. Jimmey Ralph, MD 11/12/2022 12:42 PM

## 2022-11-13 ENCOUNTER — Other Ambulatory Visit: Payer: Self-pay

## 2022-11-13 DIAGNOSIS — Z8249 Family history of ischemic heart disease and other diseases of the circulatory system: Secondary | ICD-10-CM

## 2022-11-18 ENCOUNTER — Other Ambulatory Visit: Payer: Medicare Other

## 2022-11-18 DIAGNOSIS — Z8249 Family history of ischemic heart disease and other diseases of the circulatory system: Secondary | ICD-10-CM | POA: Diagnosis not present

## 2022-11-18 DIAGNOSIS — E7889 Other lipoprotein metabolism disorders: Secondary | ICD-10-CM | POA: Diagnosis not present

## 2022-11-19 ENCOUNTER — Other Ambulatory Visit: Payer: Medicare Other

## 2022-11-20 ENCOUNTER — Telehealth: Payer: Self-pay | Admitting: *Deleted

## 2022-11-20 DIAGNOSIS — C61 Malignant neoplasm of prostate: Secondary | ICD-10-CM | POA: Diagnosis not present

## 2022-11-20 DIAGNOSIS — N401 Enlarged prostate with lower urinary tract symptoms: Secondary | ICD-10-CM | POA: Diagnosis not present

## 2022-11-20 DIAGNOSIS — R3914 Feeling of incomplete bladder emptying: Secondary | ICD-10-CM | POA: Diagnosis not present

## 2022-11-20 NOTE — Telephone Encounter (Signed)
CALLED PATIENT TO REMIND OF PRE-SEED APPTS. FOR 11-21-22, LVM FOR A RETURN CALL

## 2022-11-20 NOTE — Progress Notes (Signed)
  Radiation Oncology         [phone]) [phone] ________________________________  Name: Alan Brave, PhD MRN: [id number]  Date: 11/21/2022  DOB: 12/03/1945  SIMULATION AND TREATMENT PLANNING NOTE PUBIC ARCH STUDY  [id number], Katina Degree, MD  Marcine Matar, MD  DIAGNOSIS: 77 y.o. gentleman with Stage T2 adenocarcinoma of the prostate with Gleason score of 4+5, and PSA of 14.9.   Oncology History  Prostate cancer (HCC)  08/23/2022 Cancer Staging   Staging form: Prostate, AJCC 8th Edition - Clinical stage from 08/23/2022: Stage IVA (cT2, cN1, cM0, PSA: 14.9, Grade Group: 5) - Signed by Marcello Fennel, PA-C on 11/20/2022 Histopathologic type: Adenocarcinoma, NOS Stage prefix: Initial diagnosis Prostate specific antigen (PSA) range: 10 to 19 Gleason primary pattern: 4 Gleason secondary pattern: 5 Gleason score: 9 Histologic grading system: 5 grade system Number of biopsy cores examined: 12 Number of biopsy cores positive: 12 Location of positive needle core biopsies: Both sides   08/28/2022 Initial Diagnosis   Prostate cancer (HCC)       ICD-10-CM   1. Prostate cancer (HCC)  C61       COMPLEX SIMULATION:  The patient presented today for evaluation for possible prostate seed implant. He was brought to the radiation planning suite and placed supine on the CT couch. A 3-dimensional image study set was obtained in upload to the planning computer. There, on each axial slice, I contoured the prostate gland. Then, using three-dimensional radiation planning tools I reconstructed the prostate in view of the structures from the transperineal needle pathway to assess for possible pubic arch interference. In doing so, I did not appreciate any pubic arch interference. Also, the patient's prostate volume was estimated based on the drawn structure. The volume was 49 cc.  Given the pubic arch appearance and prostate volume, patient remains a good candidate to proceed with prostate seed implant. Today, he  freely provided informed written consent to proceed.    PLAN: The patient will undergo prostate seed implant boost to be followed by IMRT.   ________________________________  Artist Pais. Kathrynn Running, M.D.

## 2022-11-20 NOTE — Progress Notes (Signed)
Radiation Oncology         [phone] ________________________________  Outpatient Follow up- Pre-seed visit  Name: Alan Meroney, PhD MRN: [id number]  Date: 11/21/2022  DOB: 02-Dec-1945  NW:GNFAOZ, Katina Degree, MD  Marcine Matar, MD   REFERRING PHYSICIAN: Marcine Matar, MD  DIAGNOSIS: 77 y.o. gentleman with Stage T2, locally advanced, adenocarcinoma of the prostate with Gleason score of 4+5, and PSA of 14.9.     ICD-10-CM   1. Prostate cancer [facility])  C61       HISTORY OF PRESENT ILLNESS: Alan Brave, PhD is a 77 y.o. male with a diagnosis of prostate cancer. He has a history of nephrolitiasis and was previously seen by Dr. Sherryl Barters in 2019. More recently, he was seen in the ED on 05/21/22 for urinary retention and acute cystitis. He was treated with catheterization and a round of Keflex. He was seen in follow up with Dr. Retta Diones on 05/28/22, digital rectal examination performed at that time showed two separate nodules on the right base/mid measuring 3-4 mm each. He was treated for probable prostatitis with a round of bactrim. When he returned for follow up in 06/2022, a PSA check revealed elevation at 14.9. Patient underwent another episode of urinary retention. He performed an at-home self-catheterization at the end of December. Dr. Retta Diones did not think he cleared his original prostatitis infection and was given another course of antibiotics. After completing 30 days of his antibiotic his symptoms cleared up completely. The patient proceeded to transrectal ultrasound with 12 biopsies of the prostate on 08/23/22.  The prostate volume measured 63 cc.  Out of 12 core biopsies, all 12 were positive.  The maximum Gleason score was 4+5, and this was seen in right base. Additionally, Gleason 3+5 was seen in right mid (with perineural invasion) and Gleason 4+4 in left mid and left mid lateral. The remaining cores showed Gleason 7 disease.   He underwent staging PSMA PET scan on 09/18/22  showing a single intensely radiotracer-avid nodal metastasis in the left obturator space but no distant nodal, visceral, or skeletal metastasis.  The patient reviewed the biopsy results with his urologist and was kindly referred to [place] for discussion of potential radiation treatment options. We initially met the patient on 10/07/22 and he was undecided at that time but after careful thought and consideration of his options, he has ultimately elected to proceed with a brachytherapy boost and SpaceOAR gel placement followed by 5 weeks of daily external beam radiotherapy for treatment of his disease. He is here today for his pre-procedure imaging for planning and to answer any additional questions he may have about this treatment.   PREVIOUS RADIATION THERAPY: No  PAST MEDICAL HISTORY:  Past Medical History:  Diagnosis Date   Actinic keratosis 10/26/2008   Qualifier: Diagnosis of  By: Leveda Anna MD, [place]     CERVICAL SPINE DISORDER, NOS 09/18/2006   Qualifier: Diagnosis of  By: Leveda Anna MD, [place]     Diverticulitis    Diverticulitis of colon 09/18/2006   Pattern is one to two flairs per year of diverticulitis which has been easily managed by early augmentin treatment.     Elevated blood sugar 09/09/2014   ERECTILE DYSFUNCTION 10/26/2008   Qualifier: Diagnosis of  By: Leveda Anna MD, Chrissie Noa     Family history of hemochromatosis 09/09/2017   Father had symtomatic, lab proven hemochromatosis.  Likely needs genetic testing.     HYPERCHOLESTEROLEMIA 09/18/2006   Primary prevention: No known CAD, CVA, PVD or DM  Hyperlipidemia    Left leg cellulitis 08/21/2012   Left ventricular diastolic dysfunction with preserved systolic function 05/16/2015   class I diastolic dysfunction by echo October 2016    Palpitations 08/19/2019   Peripheral neuropathy 09/09/2014   Recurrent oral herpes simplex infection 09/09/2014   Seborrheic keratoses 08/27/2013   Thrombocytopenia (HCC) 07/26/2013   Has low normal platelet count and  seems to have a qualitative platelet defect in that he bleeds very easily - previous normal work up.    Uric acid kidney stone 03/13/2015   Stone analysis done 03/2015 Uric acid stones occur in 10% of all kidney stones and are the second most-common cause of urinary stones after calcium oxalate and calcium phosphate calculi. The most important risk factor for uric acid crystallization and stone formation is a low urine pH (below 5.5) rather than an increased urinary uric acid excretion. Main causes of low urine pH are tubular disorders   Vitamin D deficiency disease 09/09/2014      PAST SURGICAL HISTORY: Past Surgical History:  Procedure Laterality Date   EYE SURGERY     SPINE SURGERY     1990s    FAMILY HISTORY:  Family History  Problem Relation Age of Onset   Cancer Mother        Vulvar   COPD Father    Cancer Sister        breast   Heart disease Sister    Heart disease Brother    Thyroid disease Sister     SOCIAL HISTORY:  Social History   Socioeconomic History   Marital status: Married    Spouse name: Johnny Bridge   Number of children: 3   Years of education: PHD   Highest education level: Not on file  Occupational History   Occupation: PHD-physcologist     Employer: [employer]  Tobacco Use   Smoking status: Never   Smokeless tobacco: Never  Vaping Use   Vaping Use: Never used  Substance and Sexual Activity   Alcohol use: Not Currently   Drug use: No   Sexual activity: Yes    Comment: monagomous  Other Topics Concern   Not on file  Social History Narrative   Emergency Contact: wife, Johnny Bridge, [phone]   Diet: Pt has a varied diet of protein, starch, and vegetables/fruits   Seatbelts: Pt reports wearing seatbelt when in vehicles.    Sun Exposure/Protection: Pt reports wearing ball cap, long sleeve   Hobbies: golf, exercise, outdoors, reading       Patient lives with wife Johnny Bridge) in two level home 03/23/2020   Transportation: Patient has own vehicle  and drives himself 03/23/2020   Important Relationships Spouse, kids, grandkids, friends  03/23/2020   Pets: None 03/23/2020   Education / Work:  PhD/ Psychologist 03/23/2020   Interests / Fun: Golf, travel, keep busy, pickle ball 04/19/2021   Current Stressors: Minor life hassles 03/23/2020   Religious / Personal Beliefs: Catholic 03/23/2020  Social Determinants of Health   Financial Resource Strain: Low Risk  (04/19/2021)   Overall Financial Resource Strain (CARDIA)    Difficulty of Paying Living Expenses: Not hard at all  Food Insecurity: No Food Insecurity (10/07/2022)   Hunger Vital Sign    Worried About Running Out of Food in the Last Year: Never true    Ran Out of Food in the Last Year: Never true  Transportation Needs: No Transportation Needs (10/07/2022)   PRAPARE - Administrator, Civil Service (Medical): No    Lack of Transportation (Non-Medical): No  Physical Activity: Sufficiently Active (04/19/2021)   Exercise Vital Sign    Days of Exercise per Week: 5 days    Minutes of Exercise per Session: 60 min  Stress: No Stress Concern Present (04/19/2021)   [employer] of Occupational Health - Occupational Stress Questionnaire    Feeling of Stress : Only a little  Social Connections: Socially Integrated (04/19/2021)   Social Connection and Isolation Panel [NHANES]    Frequency of Communication with Friends and Family: More than three times a week    Frequency of Social Gatherings with Friends and Family: More than three times a week    Attends Religious Services: 1 to 4 times per year    Active Member of [employer] or Organizations: Yes    Attends Engineer, structural: More than 4 times per year    Marital Status: Married  Catering manager Violence: Not At Risk (10/07/2022)   Humiliation, Afraid, Rape, and Kick questionnaire    Fear of Current or Ex-Partner: No     Emotionally Abused: No    Physically Abused: No    Sexually Abused: No    ALLERGIES: Patient has no known allergies.  MEDICATIONS:  Current Outpatient Medications  Medication Sig Dispense Refill   acyclovir (ZOVIRAX) 800 MG tablet TAKE 1 TABLET(800 MG) BY MOUTH TWICE DAILY 20 tablet 11   alfuzosin (UROXATRAL) 10 MG 24 hr tablet Take 1 tablet (10 mg total) by mouth daily with breakfast. 30 tablet 11   amLODipine (NORVASC) 5 MG tablet TAKE 1 TABLET(5 MG) BY MOUTH AT BEDTIME 90 tablet 3   amoxicillin-clavulanate (AUGMENTIN) 875-125 MG tablet Take 1 tablet by mouth 2 (two) times daily as needed (Diverticulitis). 20 tablet 3   arginine 500 MG tablet Take 1,200 mg by mouth daily.     cholecalciferol (VITAMIN D) 1000 units tablet Take 1,000 Units daily by mouth.     CINNAMON PO Take by mouth.     diazepam (VALIUM) 5 MG tablet Take 1 tablet (5 mg total) by mouth every 12 (twelve) hours as needed for muscle spasms. 30 tablet 1   relugolix (ORGOVYX) 120 MG tablet Take 120 mg by mouth daily.     rosuvastatin (CRESTOR) 20 MG tablet TAKE 1 TABLET(20 MG) BY MOUTH DAILY 90 tablet 3   tadalafil (CIALIS) 5 MG tablet Take 1 tablet (5 mg total) by mouth every other day.     TURMERIC PO Take 1,000 mg by mouth daily.     No current facility-administered medications for this visit.    REVIEW OF SYSTEMS:   On review of systems, the patient reports that he is doing well overall. He denies any chest pain, shortness of breath, cough, fevers, chills, night sweats, unintended weight changes. He denies any bowel disturbances, and denies abdominal pain, nausea or vomiting. He denies any new musculoskeletal or joint aches or pains. His IPSS was 14, indicating moderate urinary symptoms. His  SHIM was 19, indicating he does have mild erectile dysfunction. A complete review of systems is obtained and is otherwise negative.     PHYSICAL EXAM:  Wt Readings from Last 3 Encounters:  10/22/22 202 lb 4.8 oz (91.8 kg)   10/07/22 206 lb 9.6 oz (93.7 kg)  06/11/22 200 lb (90.7 kg)   Temp Readings from Last 3 Encounters:  10/22/22 (!) 97.5 F (36.4 C) (Temporal)  10/07/22 97.6 F (36.4 C)  05/21/22 99.6 F (37.6 C) (Oral)   BP Readings from Last 3 Encounters:  10/22/22 (!) 155/70  10/07/22 (!) 155/78  06/11/22 (!) 141/69   Pulse Readings from Last 3 Encounters:  10/22/22 (!) 55  10/07/22 (!) 50  06/11/22 (!) 59    /10  In general this is a well appearing Caucasian male in no acute distress. He's alert and oriented x4 and appropriate throughout the examination. Cardiopulmonary assessment is negative for acute distress, and he exhibits normal effort.     KPS = 100  100 - Normal; no complaints; no evidence of disease. 90   - Able to carry on normal activity; minor signs or symptoms of disease. 80   - Normal activity with effort; some signs or symptoms of disease. 72   - Cares for self; unable to carry on normal activity or to do active work. 60   - Requires occasional assistance, but is able to care for most of his personal needs. 50   - Requires considerable assistance and frequent medical care. 40   - Disabled; requires special care and assistance. 30   - Severely disabled; hospital admission is indicated although death not imminent. 20   - Very sick; hospital admission necessary; active supportive treatment necessary. 10   - Moribund; fatal processes progressing rapidly. 0     - Dead  Karnofsky DA, Abelmann WH, Craver LS and Burchenal [facility] [phone]) The use of the nitrogen mustards in the palliative treatment of carcinoma: with particular reference to bronchogenic carcinoma Cancer 1 634-56  LABORATORY DATA:  Lab Results  Component Value Date   WBC 5.4 10/22/2022   HGB 15.0 10/22/2022   HCT 45.5 10/22/2022   MCV 94.7 10/22/2022   PLT 144.0 (L) 10/22/2022   Lab Results  Component Value Date   NA 141 10/22/2022   K 4.5 10/22/2022   CL 107 10/22/2022   CO2 28 10/22/2022   Lab Results   Component Value Date   ALT 22 10/22/2022   AST 28 10/22/2022   ALKPHOS 72 10/22/2022   BILITOT 0.6 10/22/2022     RADIOGRAPHY: No results found.    IMPRESSION/PLAN: 1. 77 y.o. gentleman with Stage T2 adenocarcinoma of the prostate with Gleason score of 4+5, and PSA of 14.9.  The patient has elected to proceed with a brachytherapy boost and SpaceOAR gel placement followed by 5 weeks of daily external beam radiotherapy for treatment of his disease. We reviewed the risks, benefits, short and long-term effects associated with brachytherapy and discussed the role of SpaceOAR in reducing the rectal toxicity associated with radiotherapy.  He appears to have a good understanding of his disease and our treatment recommendations which are of curative intent.  He was encouraged to ask questions that were answered to his stated satisfaction. He has freely signed written consent to proceed today in the office and a copy of this document will be placed in his medical record. His procedure is tentatively scheduled for 12/27/22 in collaboration with Dr. Retta Diones and we will see  him back for his post-procedure visit approximately 3 weeks thereafter and we will proceed with CT SIM/treatment planning in preparation for the 5 weeks of daily radiotherapy at that time. We look forward to continuing to participate in his care. He knows that he is welcome to call with any questions or concerns at any time in the interim.  I personally spent 30 minutes in this encounter including chart review, reviewing radiological studies, meeting face-to-face with the patient, entering orders and completing documentation.    Marguarite Arbour, MMS, PA-C   [facility] Radiation Oncology Physician Assistant Direct Dial: [phone]  Fax: [phone]

## 2022-11-21 ENCOUNTER — Ambulatory Visit
Admission: RE | Admit: 2022-11-21 | Discharge: 2022-11-21 | Disposition: A | Discharge status: Home / Self Care | Payer: Medicare Other | Source: Ambulatory Visit | Attending: Urology | Admitting: Urology

## 2022-11-21 ENCOUNTER — Encounter: Payer: Self-pay | Admitting: Urology

## 2022-11-21 ENCOUNTER — Ambulatory Visit
Admission: RE | Admit: 2022-11-21 | Discharge: 2022-11-21 | Disposition: A | Discharge status: Home / Self Care | Payer: Medicare Other | Source: Ambulatory Visit | Attending: Radiation Oncology | Admitting: Radiation Oncology

## 2022-11-21 VITALS — Resp 19 | Ht 77.0 in | Wt 203.0 lb

## 2022-11-21 DIAGNOSIS — Z191 Hormone sensitive malignancy status: Secondary | ICD-10-CM | POA: Diagnosis not present

## 2022-11-21 DIAGNOSIS — C61 Malignant neoplasm of prostate: Secondary | ICD-10-CM

## 2022-11-21 NOTE — Progress Notes (Addendum)
Pre-seed nursing interview for a diagnosis of  77 y.o. gentleman with Stage T2, locally advanced, adenocarcinoma of the prostate with Gleason score of 4+5, and PSA of 14.9.  Patient identity verified x2. Patient reports doing well. No issues conveyed at this time.  Meaningful use complete. Urinary Management medication(s)- Tamsulosin Urology appointment date- 12/2022, with Dr. Retta Diones  at [facility] [place]  Resp 19   Ht 6\' 5"  (1.956 m)   Wt 203 lb (92.1 kg)   BMI 24.07 kg/m   This concludes the interview.   Ruel Favors, LPN

## 2022-11-22 LAB — LIPOPROTEIN A (LPA): Lipoprotein (a): <10 nmol/L (ref <75)

## 2022-11-25 NOTE — Progress Notes (Signed)
Great news! Lipoprotein A is at optimal levels.

## 2022-11-26 ENCOUNTER — Telehealth: Payer: Self-pay | Admitting: Family Medicine

## 2022-11-26 NOTE — Telephone Encounter (Signed)
Contacted [name], PhD to schedule their annual wellness visit. Appointment made for 12/09/2022.  Gabriel Cirri [facility] AW[facility] TEAM Direct Dial [phone]

## 2022-12-09 ENCOUNTER — Ambulatory Visit (INDEPENDENT_AMBULATORY_CARE_PROVIDER_SITE_OTHER): Payer: Medicare Other

## 2022-12-09 VITALS — Wt 203.0 lb

## 2022-12-09 DIAGNOSIS — Z0000 Encounter for general adult medical examination without abnormal findings: Secondary | ICD-10-CM | POA: Diagnosis not present

## 2022-12-09 NOTE — Progress Notes (Signed)
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  Patient Location: Home  Provider Location: Office/Clinic  I discussed the limitations of evaluation and management by telemedicine. The patient expressed understanding and agreed to proceed.   Patient Medicare AWV questionnaire was completed by the patient on 12/05/22; I have confirmed that all information answered by patient is correct and no changes since this date.      Subjective:   Alan Brave, PhD is a 77 y.o. male who presents for Medicare Annual/Subsequent preventive examination.  Review of Systems     Cardiac Risk Factors include: advanced age (>80men, >3 women);male gender;hypertension;dyslipidemia     Objective:    Today's Vitals   12/05/22 1944 12/09/22 1610  Weight:  203 lb (92.1 kg)  PainSc: 0-No pain    Body mass index is 24.07 kg/m.     12/09/2022    4:16 PM 11/21/2022    9:54 AM 10/07/2022    8:43 AM 05/21/2022    3:38 AM 04/19/2021    1:51 PM 03/23/2020    1:49 PM 05/10/2019    9:02 AM  Advanced Directives  Does Patient Have a Medical Advance Directive? Yes Yes Yes No Yes Yes Yes  Type of Estate agent of [place];Living will Living will;Healthcare Power of [employer] Power of [place];Living will  Healthcare Power of [place];Living will Living will;Healthcare Power of [employer] Power of [place];Living will  Does patient want to make changes to medical advance directive? No - Patient declined    No - Patient declined No - Patient declined   Copy of Healthcare Power of Attorney in Chart? Yes - validated most recent copy scanned in chart (See row information)    No - copy requested No - copy requested No - copy requested  Would patient like information on creating a medical advance directive?    No - Patient declined       Current  Medications (verified) Outpatient Encounter Medications as of 12/09/2022  Medication Sig   acyclovir (ZOVIRAX) 800 MG tablet TAKE 1 TABLET(800 MG) BY MOUTH TWICE DAILY   amLODipine (NORVASC) 5 MG tablet TAKE 1 TABLET(5 MG) BY MOUTH AT BEDTIME   amoxicillin-clavulanate (AUGMENTIN) 875-125 MG tablet Take 1 tablet by mouth 2 (two) times daily as needed (Diverticulitis).   arginine 500 MG tablet Take 1,200 mg by mouth daily.   cholecalciferol (VITAMIN D) 1000 units tablet Take 1,000 Units daily by mouth.   CINNAMON PO Take by mouth.   diazepam (VALIUM) 5 MG tablet Take 1 tablet (5 mg total) by mouth every 12 (twelve) hours as needed for muscle spasms.   relugolix (ORGOVYX) 120 MG tablet Take 120 mg by mouth daily.   rosuvastatin (CRESTOR) 20 MG tablet TAKE 1 TABLET(20 MG) BY MOUTH DAILY   tadalafil (CIALIS) 5 MG tablet Take 1 tablet (5 mg total) by mouth every other day.   tamsulosin (FLOMAX) 0.4 MG CAPS capsule Take 0.4 mg by mouth.   TURMERIC PO Take 1,000 mg by mouth daily.   [DISCONTINUED] alfuzosin (UROXATRAL) 10 MG 24 hr tablet Take 1 tablet (10 mg total) by mouth daily with breakfast.   No facility-administered encounter medications on file as of 12/09/2022.    Allergies (verified) Patient has no known allergies.   History: Past Medical History:  Diagnosis Date   Actinic keratosis 10/26/2008   Qualifier: Diagnosis of  By: Leveda Anna  MD, Chrissie Noa     CERVICAL SPINE DISORDER, NOS 09/18/2006   Qualifier: Diagnosis of  By: Leveda Anna MD, [place]     Diverticulitis    Diverticulitis of colon 09/18/2006   Pattern is one to two flairs per year of diverticulitis which has been easily managed by early augmentin treatment.     Elevated blood sugar 09/09/2014   ERECTILE DYSFUNCTION 10/26/2008   Qualifier: Diagnosis of  By: Leveda Anna MD, Chrissie Noa     Family history of hemochromatosis 09/09/2017   Father had symtomatic, lab proven hemochromatosis.  Likely needs genetic testing.     HYPERCHOLESTEROLEMIA  09/18/2006   Primary prevention: No known CAD, CVA, PVD or DM     Hyperlipidemia    Left leg cellulitis 08/21/2012   Left ventricular diastolic dysfunction with preserved systolic function 05/16/2015   class I diastolic dysfunction by echo October 2016    Palpitations 08/19/2019   Peripheral neuropathy 09/09/2014   Recurrent oral herpes simplex infection 09/09/2014   Seborrheic keratoses 08/27/2013   Thrombocytopenia (HCC) 07/26/2013   Has low normal platelet count and seems to have a qualitative platelet defect in that he bleeds very easily - previous normal work up.    Uric acid kidney stone 03/13/2015   Stone analysis done 03/2015 Uric acid stones occur in 10% of all kidney stones and are the second most-common cause of urinary stones after calcium oxalate and calcium phosphate calculi. The most important risk factor for uric acid crystallization and stone formation is a low urine pH (below 5.5) rather than an increased urinary uric acid excretion. Main causes of low urine pH are tubular disorders   Vitamin D deficiency disease 09/09/2014   Past Surgical History:  Procedure Laterality Date   EYE SURGERY     SPINE SURGERY     1990s   Family History  Problem Relation Age of Onset   Cancer Mother        Vulvar   COPD Father    Cancer Sister        breast   Heart disease Sister    Heart disease Brother    Thyroid disease Sister    Social History   Socioeconomic History   Marital status: Married    Spouse name: Johnny Bridge   Number of children: 3   Years of education: PHD   Highest education level: Not on file  Occupational History   Occupation: PHD-physcologist     Employer: [employer]  Tobacco Use   Smoking status: Never   Smokeless tobacco: Never  Vaping Use   Vaping Use: Never used  Substance and Sexual Activity   Alcohol use: Not Currently   Drug use: No   Sexual activity: Yes    Comment: monagomous  Other Topics Concern   Not on file  Social History Narrative    Emergency Contact: wife, Johnny Bridge, [phone]   Diet: Pt has a varied diet of protein, starch, and vegetables/fruits   Seatbelts: Pt reports wearing seatbelt when in vehicles.    Sun Exposure/Protection: Pt reports wearing ball cap, long sleeve   Hobbies: golf, exercise, outdoors, reading       Patient lives with wife Johnny Bridge) in two level home 03/23/2020   Transportation: Patient has own vehicle and drives himself 03/23/2020   Important Relationships Spouse, kids, grandkids, friends  03/23/2020   Pets: None 03/23/2020   Education / Work:  PhD/ Psychologist 03/23/2020   Interests / Fun: Golf, travel, keep busy, pickle ball 04/19/2021   Current  Stressors: Minor life hassles 03/23/2020   Religious / Personal Beliefs: Catholic 03/23/2020                                                                                                   Social Determinants of Health   Financial Resource Strain: Low Risk  (12/05/2022)   Overall Financial Resource Strain (CARDIA)    Difficulty of Paying Living Expenses: Not hard at all  Food Insecurity: No Food Insecurity (12/05/2022)   Hunger Vital Sign    Worried About Running Out of Food in the Last Year: Never true    Ran Out of Food in the Last Year: Never true  Transportation Needs: No Transportation Needs (12/05/2022)   PRAPARE - Administrator, Civil Service (Medical): No    Lack of Transportation (Non-Medical): No  Physical Activity: Sufficiently Active (12/05/2022)   Exercise Vital Sign    Days of Exercise per Week: 7 days    Minutes of Exercise per Session: 150+ min  Stress: No Stress Concern Present (12/05/2022)   [employer] of Occupational Health - Occupational Stress Questionnaire    Feeling of Stress : Not at all  Social Connections: Moderately Isolated (12/05/2022)   Social Connection and Isolation Panel [NHANES]    Frequency of Communication with Friends and Family: More than three times a week    Frequency of Social  Gatherings with Friends and Family: More than three times a week    Attends Religious Services: Never    Database administrator or Organizations: No    Attends Engineer, structural: Never    Marital Status: Married    Tobacco Counseling Counseling given: Not Answered   Clinical Intake:  Pre-visit preparation completed: Yes  Pain : No/denies pain Pain Score: 0-No pain     BMI - recorded: 24.07 Nutritional Status: BMI of 19-24  Normal Nutritional Risks: None Diabetes: No  How often do you need to have someone help you when you read instructions, pamphlets, or other written materials from your doctor or pharmacy?: 1 - Never  Diabetic?no  Interpreter Needed?: No  Information entered by :: Lanier Ensign, LPN   Activities of Daily Living    12/05/2022    7:44 PM  In your present state of health, do you have any difficulty performing the following activities:  Hearing? 0  Vision? 0  Difficulty concentrating or making decisions? 0  Walking or climbing stairs? 0  Dressing or bathing? 0  Preparing Food and eating ? N  Using the Toilet? N  In the past six months, have you accidently leaked urine? N  Do you have problems with loss of bowel control? N  Managing your Medications? N  Managing your Finances? N  Housekeeping or managing your Housekeeping? N    Patient Care Team: Ardith Dark, MD as PCP - General (Family Medicine) Ernesto Rutherford, MD (Ophthalmology) Alma Friendly (Dentistry) Cherlyn Cushing, RN as Oncology Nurse Navigator  Indicate any recent Medical Services you may have received from other than [facility] providers in the past year (date may be approximate).  Assessment:   This is a routine wellness examination for [place].  Hearing/Vision screen Hearing Screening - Comments:: Pt wears hearing aids  Vision Screening - Comments:: Pt follows up with Dr Dione Booze for annual eye exam   Dietary issues and exercise activities discussed: Current Exercise  Habits: Home exercise routine, Type of exercise: Other - see comments, Time (Minutes): > 60, Frequency (Times/Week): 7, Weekly Exercise (Minutes/Week): 0   Goals Addressed             This Visit's Progress    Patient Stated       Beating prostrate cancer        Depression Screen    12/09/2022    4:15 PM 10/22/2022    9:45 AM 10/07/2022    8:53 AM 10/01/2021    8:32 AM 04/19/2021    1:52 PM 09/25/2020    8:38 AM 03/31/2020    8:37 AM  PHQ 2/9 Scores  PHQ - 2 Score 0 0 0 0 0 0 0  PHQ- 9 Score    0       Fall Risk    12/05/2022    7:44 PM 10/22/2022    9:45 AM 04/19/2021    1:52 PM 03/23/2020    1:50 PM 08/19/2019   10:13 AM  Fall Risk   Falls in the past year? 0 0 0 0 0  Number falls in past yr: 0 0 0    Injury with Fall? 0 0 0    Risk for fall due to : Impaired vision No Fall Risks No Fall Risks    Follow up Falls prevention discussed  Falls prevention discussed      FALL RISK PREVENTION PERTAINING TO THE HOME:  Any stairs in or around the home? Yes  If so, are there any without handrails? No  Home free of loose throw rugs in walkways, pet beds, electrical cords, etc? Yes  Adequate lighting in your home to reduce risk of falls? Yes   ASSISTIVE DEVICES UTILIZED TO PREVENT FALLS:  Life alert? No  Use of a cane, walker or w/c? No  Grab bars in the bathroom? Yes  Shower chair or bench in shower? No  Elevated toilet seat or a handicapped toilet? No   TIMED UP AND GO:  Was the test performed? No .   Cognitive Function:    08/23/2013    4:00 PM 04/23/2012   10:00 AM  MMSE - Mini Mental State Exam  Orientation to time 5 5  Orientation to Place 5 5  Registration 3 3  Attention/ Calculation 5 5  Recall 3 3  Language- name 2 objects 2 2  Language- repeat 1 1  Language- follow 3 step command 3 3  Language- read & follow direction 1 1  Write a sentence 1 1  Copy design 1 1  Total score 30 30        12/09/2022    4:17 PM 04/19/2021    1:56 PM 03/23/2020    1:52 PM  05/10/2019    9:01 AM  6CIT Screen  What Year? 0 points 0 points 0 points 0 points  What month? 0 points 0 points 0 points 0 points  What time? 0 points 0 points 0 points 0 points  Count back from 20 0 points 0 points 0 points 0 points  Months in reverse 0 points 0 points 0 points 0 points  Repeat phrase 0 points 0 points 0 points 0 points  Total Score 0  points 0 points 0 points 0 points    Immunizations Immunization History  Administered Date(s) Administered   Fluad Quad(high Dose 65+) 05/04/2019   Influenza Split 04/09/2012   Influenza Whole 05/24/2008   Influenza,inj,Quad PF,6+ Mos 06/02/2014, 05/03/2015   Influenza-Unspecified 05/03/2013, 06/05/2016, 04/25/2017, 04/21/2018, 05/05/2019, 05/10/2020   PFIZER(Purple Top)SARS-COV-2 Vaccination 08/10/2019, 08/30/2019, 11/07/2020, 03/29/2021   Pfizer Covid-19 Vaccine Bivalent Booster 75yrs & up 03/22/2021   Pneumococcal Conjugate-13 08/27/2013   Pneumococcal Polysaccharide-23 08/21/2012   Td 02/20/1999   Tdap 08/09/2011, 10/01/2021   Unspecified SARS-COV-2 Vaccination 04/16/2020   Zoster Recombinat (Shingrix) 09/13/2020, 03/03/2021   Zoster, Live 10/27/2008    TDAP status: Up to date  Flu Vaccine status: Due, Education has been provided regarding the importance of this vaccine. Advised may receive this vaccine at local pharmacy or Health Dept. Aware to provide a copy of the vaccination record if obtained from local pharmacy or Health Dept. Verbalized acceptance and understanding.  Pneumococcal vaccine status: Up to date  Covid-19 vaccine status: Completed vaccines  Qualifies for Shingles Vaccine? Yes   Zostavax completed Yes   Shingrix Completed?: Yes  Screening Tests Health Maintenance  Topic Date Due   COVID-19 Vaccine (6 - 2023-24 season) 03/22/2022   INFLUENZA VACCINE  02/20/2023   LIPID PANEL  10/22/2023   Medicare Annual Wellness (AWV)  12/09/2023   COLONOSCOPY (Pts 45-50yrs Insurance coverage will need to be  confirmed)  11/01/2025   DTaP/Tdap/Td (4 - Td or Tdap) 10/02/2031   Pneumonia Vaccine 51+ Years old  Completed   Hepatitis C Screening  Completed   Zoster Vaccines- Shingrix  Completed   HPV VACCINES  Aged Out    Health Maintenance  Health Maintenance Due  Topic Date Due   COVID-19 Vaccine (6 - 2023-24 season) 03/22/2022    Colorectal cancer screening: Type of screening: Colonoscopy. Completed 11/02/15. Repeat every 10 years    Additional Screening:  Hepatitis C Screening:  Completed 05/03/15  Vision Screening: Recommended annual ophthalmology exams for early detection of glaucoma and other disorders of the eye. Is the patient up to date with their annual eye exam?  Yes  Who is the provider or what is the name of the office in which the patient attends annual eye exams? Dr Dione Booze  If pt is not established with a provider, would they like to be referred to a provider to establish care? No .   Dental Screening: Recommended annual dental exams for proper oral hygiene  Community Resource Referral / Chronic Care Management: CRR required this visit?  No   CCM required this visit?  No      Plan:     I have personally reviewed and noted the following in the patient's chart:   Medical and social history Use of alcohol, tobacco or illicit drugs  Current medications and supplements including opioid prescriptions. Patient is not currently taking opioid prescriptions. Functional ability and status Nutritional status Physical activity Advanced directives List of other physicians Hospitalizations, surgeries, and ER visits in previous 12 months Vitals Screenings to include cognitive, depression, and falls Referrals and appointments  In addition, I have reviewed and discussed with patient certain preventive protocols, quality metrics, and best practice recommendations. A written personalized care plan for preventive services as well as general preventive health recommendations  were provided to patient.     Marzella Schlein, LPN   [id number]/61/0960   Nurse Notes: none

## 2022-12-09 NOTE — Patient Instructions (Signed)
Mr. Alan Gonzalez , Thank you for taking time to come for your Medicare Wellness Visit. I appreciate your ongoing commitment to your health goals. Please review the following plan we discussed and let me know if I can assist you in the future.   These are the goals we discussed:  Goals       Maintain current level of physical activities (pt-stated)      Maintain current level of physical activity (pt-stated)      Patient Stated      Continue to maintain current level of physical activity       Patient Stated      Beating prostrate cancer         This is a list of the screening recommended for you and due dates:  Health Maintenance  Topic Date Due   COVID-19 Vaccine (6 - 2023-24 season) 03/22/2022   Flu Shot  02/20/2023   Lipid (cholesterol) test  10/22/2023   Medicare Annual Wellness Visit  12/09/2023   Colon Cancer Screening  11/01/2025   DTaP/Tdap/Td vaccine (4 - Td or Tdap) 10/02/2031   Pneumonia Vaccine  Completed   Hepatitis C Screening: USPSTF Recommendation to screen - Ages 31-79 yo.  Completed   Zoster (Shingles) Vaccine  Completed   HPV Vaccine  Aged Out   Advanced directives: pt stated copies in chart   Conditions/risks identified: beat prostrate cancer   Next appointment: Follow up in one year for your annual wellness visit.   Preventive Care 10 Years and Older, Male  Preventive care refers to lifestyle choices and visits with your health care provider that can promote health and wellness. What does preventive care include? A yearly physical exam. This is also called an annual well check. Dental exams once or twice a year. Routine eye exams. Ask your health care provider how often you should have your eyes checked. Personal lifestyle choices, including: Daily care of your teeth and gums. Regular physical activity. Eating a healthy diet. Avoiding tobacco and drug use. Limiting alcohol use. Practicing safe sex. Taking low doses of aspirin every day. Taking  vitamin and mineral supplements as recommended by your health care provider. What happens during an annual well check? The services and screenings done by your health care provider during your annual well check will depend on your age, overall health, lifestyle risk factors, and family history of disease. Counseling  Your health care provider may ask you questions about your: Alcohol use. Tobacco use. Drug use. Emotional well-being. Home and relationship well-being. Sexual activity. Eating habits. History of falls. Memory and ability to understand (cognition). Work and work Astronomer. Screening  You may have the following tests or measurements: Height, weight, and BMI. Blood pressure. Lipid and cholesterol levels. These may be checked every 5 years, or more frequently if you are over 29 years old. Skin check. Lung cancer screening. You may have this screening every year starting at age 67 if you have a 30-pack-year history of smoking and currently smoke or have quit within the past 15 years. Fecal occult blood test (FOBT) of the stool. You may have this test every year starting at age 63. Flexible sigmoidoscopy or colonoscopy. You may have a sigmoidoscopy every 5 years or a colonoscopy every 10 years starting at age 90. Prostate cancer screening. Recommendations will vary depending on your family history and other risks. Hepatitis C blood test. Hepatitis B blood test. Sexually transmitted disease (STD) testing. Diabetes screening. This is done by checking your blood sugar (  glucose) after you have not eaten for a while (fasting). You may have this done every 1-3 years. Abdominal aortic aneurysm (AAA) screening. You may need this if you are a current or former smoker. Osteoporosis. You may be screened starting at age 22 if you are at high risk. Talk with your health care provider about your test results, treatment options, and if necessary, the need for more tests. Vaccines  Your  health care provider may recommend certain vaccines, such as: Influenza vaccine. This is recommended every year. Tetanus, diphtheria, and acellular pertussis (Tdap, Td) vaccine. You may need a Td booster every 10 years. Zoster vaccine. You may need this after age 28. Pneumococcal 13-valent conjugate (PCV13) vaccine. One dose is recommended after age 92. Pneumococcal polysaccharide (PPSV23) vaccine. One dose is recommended after age 54. Talk to your health care provider about which screenings and vaccines you need and how often you need them. This information is not intended to replace advice given to you by your health care provider. Make sure you discuss any questions you have with your health care provider. Document Released: 08/04/2015 Document Revised: 03/27/2016 Document Reviewed: 05/09/2015 Elsevier Interactive Patient Education  2017 [employer].  Fall Prevention in the Home Falls can cause injuries. They can happen to people of all ages. There are many things you can do to make your home safe and to help prevent falls. What can I do on the outside of my home? Regularly fix the edges of walkways and driveways and fix any cracks. Remove anything that might make you trip as you walk through a door, such as a raised step or threshold. Trim any bushes or trees on the path to your home. Use bright outdoor lighting. Clear any walking paths of anything that might make someone trip, such as rocks or tools. Regularly check to see if handrails are loose or broken. Make sure that both sides of any steps have handrails. Any raised decks and porches should have guardrails on the edges. Have any leaves, snow, or ice cleared regularly. Use sand or salt on walking paths during winter. Clean up any spills in your garage right away. This includes oil or grease spills. What can I do in the bathroom? Use night lights. Install grab bars by the toilet and in the tub and shower. Do not use towel bars as  grab bars. Use non-skid mats or decals in the tub or shower. If you need to sit down in the shower, use a plastic, non-slip stool. Keep the floor dry. Clean up any water that spills on the floor as soon as it happens. Remove soap buildup in the tub or shower regularly. Attach bath mats securely with double-sided non-slip rug tape. Do not have throw rugs and other things on the floor that can make you trip. What can I do in the bedroom? Use night lights. Make sure that you have a light by your bed that is easy to reach. Do not use any sheets or blankets that are too big for your bed. They should not hang down onto the floor. Have a firm chair that has side arms. You can use this for support while you get dressed. Do not have throw rugs and other things on the floor that can make you trip. What can I do in the kitchen? Clean up any spills right away. Avoid walking on wet floors. Keep items that you use a lot in easy-to-reach places. If you need to reach something above you, use  a strong step stool that has a grab bar. Keep electrical cords out of the way. Do not use floor polish or wax that makes floors slippery. If you must use wax, use non-skid floor wax. Do not have throw rugs and other things on the floor that can make you trip. What can I do with my stairs? Do not leave any items on the stairs. Make sure that there are handrails on both sides of the stairs and use them. Fix handrails that are broken or loose. Make sure that handrails are as long as the stairways. Check any carpeting to make sure that it is firmly attached to the stairs. Fix any carpet that is loose or worn. Avoid having throw rugs at the top or bottom of the stairs. If you do have throw rugs, attach them to the floor with carpet tape. Make sure that you have a light switch at the top of the stairs and the bottom of the stairs. If you do not have them, ask someone to add them for you. What else can I do to help prevent  falls? Wear shoes that: Do not have high heels. Have rubber bottoms. Are comfortable and fit you well. Are closed at the toe. Do not wear sandals. If you use a stepladder: Make sure that it is fully opened. Do not climb a closed stepladder. Make sure that both sides of the stepladder are locked into place. Ask someone to hold it for you, if possible. Clearly mark and make sure that you can see: Any grab bars or handrails. First and last steps. Where the edge of each step is. Use tools that help you move around (mobility aids) if they are needed. These include: Canes. Walkers. Scooters. Crutches. Turn on the lights when you go into a dark area. Replace any light bulbs as soon as they burn out. Set up your furniture so you have a clear path. Avoid moving your furniture around. If any of your floors are uneven, fix them. If there are any pets around you, be aware of where they are. Review your medicines with your doctor. Some medicines can make you feel dizzy. This can increase your chance of falling. Ask your doctor what other things that you can do to help prevent falls. This information is not intended to replace advice given to you by your health care provider. Make sure you discuss any questions you have with your health care provider. Document Released: 05/04/2009 Document Revised: 12/14/2015 Document Reviewed: 08/12/2014 Elsevier Interactive Patient Education  2017 [employer].

## 2022-12-11 ENCOUNTER — Encounter (HOSPITAL_BASED_OUTPATIENT_CLINIC_OR_DEPARTMENT_OTHER): Payer: Self-pay | Admitting: Urology

## 2022-12-11 ENCOUNTER — Other Ambulatory Visit: Payer: Self-pay

## 2022-12-11 NOTE — Progress Notes (Signed)
Spoke w/ via phone for pre-op interview---pt Lab needs dos----  I stat, ekg             Lab results------see below COVID test -----patient states asymptomatic no test needed Arrive at -------930 am 12-27-2022 NPO after MN NO Solid Food.  Clear liquids from MN until---830 am Med rec completed Medications to take morning of surgery -----tamsulosin, rosuvastatin Diabetic medication -----n/a Patient instructed no nail polish to be worn day of surgery Patient instructed to bring photo id and insurance card day of surgery Patient aware to have Driver (ride ) / caregiver   spouse martha  for 24 hours after surgery  Patient Special Instructions -----fleets enema am of surgery Pre-Op special Instructions -----n/a Patient verbalized understanding of instructions that were given at this phone interview. Patient denies shortness of breath, chest pain, fever, cough at this phone interview.

## 2022-12-26 ENCOUNTER — Telehealth: Payer: Self-pay | Admitting: *Deleted

## 2022-12-26 NOTE — H&P (Signed)
H&P  Chief Complaint: PCa  History of Present Illness: 77 yo male presents for I 125 seed boost/SpaceOAR  in advance of EBRT for GG 5 PCa. He started LT ADT w/ Orgovyx 2 mos ago.  Past Medical History:  Diagnosis Date   Actinic keratosis 10/26/2008   Qualifier: Diagnosis of  By: Leveda Anna MD, Dajon     CERVICAL SPINE DISORDER, NOS 09/18/2006   Qualifier: Diagnosis of  By: Leveda Anna MD, Babak     Diverticulitis    Diverticulitis of colon 09/18/2006   Pattern is one to two flairs per year of diverticulitis which has been easily managed by early augmentin treatment.     Elevated blood sugar 09/09/2014   Elevated PSA    ERECTILE DYSFUNCTION 10/26/2008   Qualifier: Diagnosis of  By: Leveda Anna MD, Chrissie Noa     Family history of hemochromatosis 09/09/2017   Father had symtomatic, lab proven hemochromatosis.  Likely needs genetic testing.     HYPERCHOLESTEROLEMIA 09/18/2006   Primary prevention: No known CAD, CVA, PVD or DM     Hyperlipidemia    Left ventricular diastolic dysfunction with preserved systolic function 05/16/2015   class I diastolic dysfunction by echo October 2016    Melanoma [facility])    Peripheral neuropathy 09/09/2014   toes   Prostate cancer (HCC)    Recurrent oral herpes simplex infection 09/09/2014   Seborrheic keratoses 08/27/2013   Thrombocytopenia (HCC) 07/26/2013   Has low normal platelet count and seems to have a qualitative platelet defect in that he bleeds very easily - previous normal work up.    Uric acid kidney stone 03/13/2015   Stone analysis done 03/2015 Uric acid stones occur in 10% of all kidney stones and are the second most-common cause of urinary stones after calcium oxalate and calcium phosphate calculi. The most important risk factor for uric acid crystallization and stone formation is a low urine pH (below 5.5) rather than an increased urinary uric acid excretion. Main causes of low urine pH are tubular disorders   Vitamin D deficiency disease 09/09/2014    Wears glasses    Wears hearing aid in both ears     Past Surgical History:  Procedure Laterality Date   basal cell area removed x 2     EYE SURGERY Left    eye hurt by shrub, surgery done   melanoma removed from left wrist     SPINE SURGERY     1990s L 4 to L 5    Home Medications:  Allergies as of 12/26/2022   No Known Allergies      Medication List      Notice   Cannot display discharge medications because the patient has not yet been admitted.     Allergies: No Known Allergies  Family History  Problem Relation Age of Onset   Cancer Mother        Vulvar   COPD Father    Cancer Sister        breast   Heart disease Sister    Heart disease Brother    Thyroid disease Sister     Social History:  reports that he has never smoked. He has never used smokeless tobacco. He reports current alcohol use. He reports that he does not use drugs.  ROS: A complete review of systems was performed.  All systems are negative except for pertinent findings as noted.  Physical Exam:  Vital signs in last 24 hours: Ht 6\' 6"  (1.981 m)   Wt 91.6  kg   BMI 23.34 kg/m  Constitutional:  Alert and oriented, No acute distress Cardiovascular: Regular rate  Respiratory: Normal respiratory effort GI: Abdomen is soft, nontender, nondistended, no abdominal masses. No CVAT.  Genitourinary: Normal male phallus, testes are descended bilaterally and non-tender and without masses, scrotum is normal in appearance without lesions or masses, perineum is normal on inspection. Lymphatic: No lymphadenopathy Neurologic: Grossly intact, no focal deficits Psychiatric: Normal mood and affect  I have reviewed prior pt notes  I have reviewed notes from referring/previous physicians  I have reviewed urinalysis results  I have independently reviewed prior imaging  I have reviewed prior PSA results  I have reviewed prior urine culture   Impression/Assessment:  High risk PCa  Plan:  I 125  brachytherapy/SpaceOAR

## 2022-12-26 NOTE — Telephone Encounter (Signed)
Called patient to remind of procedure for 12-27-22, lvm for a return call

## 2022-12-27 ENCOUNTER — Ambulatory Visit (HOSPITAL_BASED_OUTPATIENT_CLINIC_OR_DEPARTMENT_OTHER): Payer: Medicare Other | Admitting: Anesthesiology

## 2022-12-27 ENCOUNTER — Encounter (HOSPITAL_BASED_OUTPATIENT_CLINIC_OR_DEPARTMENT_OTHER)
Admission: RE | Disposition: A | Discharge status: Home / Self Care | Payer: Self-pay | Source: Home / Self Care | Attending: Urology

## 2022-12-27 ENCOUNTER — Encounter (HOSPITAL_BASED_OUTPATIENT_CLINIC_OR_DEPARTMENT_OTHER): Payer: Self-pay | Admitting: Urology

## 2022-12-27 ENCOUNTER — Other Ambulatory Visit: Payer: Self-pay

## 2022-12-27 ENCOUNTER — Ambulatory Visit (HOSPITAL_COMMUNITY): Payer: Medicare Other

## 2022-12-27 ENCOUNTER — Ambulatory Visit (HOSPITAL_BASED_OUTPATIENT_CLINIC_OR_DEPARTMENT_OTHER)
Admission: RE | Admit: 2022-12-27 | Discharge: 2022-12-27 | Disposition: A | Discharge status: Home / Self Care | Payer: Medicare Other | Attending: Urology | Admitting: Urology

## 2022-12-27 DIAGNOSIS — G709 Myoneural disorder, unspecified: Secondary | ICD-10-CM

## 2022-12-27 DIAGNOSIS — Z803 Family history of malignant neoplasm of breast: Secondary | ICD-10-CM | POA: Diagnosis not present

## 2022-12-27 DIAGNOSIS — C61 Malignant neoplasm of prostate: Secondary | ICD-10-CM | POA: Diagnosis not present

## 2022-12-27 DIAGNOSIS — I10 Essential (primary) hypertension: Secondary | ICD-10-CM | POA: Diagnosis not present

## 2022-12-27 DIAGNOSIS — N289 Disorder of kidney and ureter, unspecified: Secondary | ICD-10-CM | POA: Diagnosis not present

## 2022-12-27 DIAGNOSIS — Z01818 Encounter for other preprocedural examination: Secondary | ICD-10-CM

## 2022-12-27 DIAGNOSIS — Z8049 Family history of malignant neoplasm of other genital organs: Secondary | ICD-10-CM | POA: Diagnosis not present

## 2022-12-27 DIAGNOSIS — Z191 Hormone sensitive malignancy status: Secondary | ICD-10-CM | POA: Diagnosis not present

## 2022-12-27 HISTORY — PX: SPACE OAR INSTILLATION: SHX6769

## 2022-12-27 HISTORY — DX: Presence of external hearing-aid: Z97.4

## 2022-12-27 HISTORY — DX: Presence of spectacles and contact lenses: Z97.3

## 2022-12-27 HISTORY — DX: Malignant neoplasm of prostate: C61

## 2022-12-27 HISTORY — PX: RADIOACTIVE SEED IMPLANT: SHX5150

## 2022-12-27 HISTORY — DX: Malignant melanoma of skin, unspecified: C43.9

## 2022-12-27 HISTORY — DX: Elevated prostate specific antigen (PSA): R97.20

## 2022-12-27 LAB — POCT I-STAT, CHEM 8
BUN: 24 mg/dL — ABNORMAL HIGH (ref 8–23)
Calcium, Ion: 1.27 mmol/L (ref 1.15–1.40)
Chloride: 104 mmol/L (ref 98–111)
Creatinine, Ser: 1.3 mg/dL — ABNORMAL HIGH (ref 0.61–1.24)
Glucose, Bld: 109 mg/dL — ABNORMAL HIGH (ref 70–99)
HCT: 40 % (ref 39.0–52.0)
Hemoglobin: 13.6 g/dL (ref 13.0–17.0)
Potassium: 4.3 mmol/L (ref 3.5–5.1)
Sodium: 141 mmol/L (ref 135–145)
TCO2: 27 mmol/L (ref 22–32)

## 2022-12-27 SURGERY — INSERTION, RADIATION SOURCE, PROSTATE
Anesthesia: General | Site: Prostate

## 2022-12-27 MED ORDER — EPHEDRINE SULFATE-NACL 50-0.9 MG/10ML-% IV SOSY
PREFILLED_SYRINGE | INTRAVENOUS | Status: DC | PRN
  Administered 2022-12-27 (×2): 5 mg via INTRAVENOUS
  Administered 2022-12-27: 10 mg via INTRAVENOUS

## 2022-12-27 MED ORDER — CELECOXIB 200 MG PO CAPS
ORAL_CAPSULE | ORAL | Status: AC
  Filled 2022-12-27: qty 1

## 2022-12-27 MED ORDER — ONDANSETRON HCL 4 MG/2ML IJ SOLN
INTRAMUSCULAR | Status: DC | PRN
  Administered 2022-12-27: 4 mg via INTRAVENOUS

## 2022-12-27 MED ORDER — SODIUM CHLORIDE 0.9 % IR SOLN
Status: DC | PRN
  Administered 2022-12-27: 1000 mL via INTRAVESICAL

## 2022-12-27 MED ORDER — LACTATED RINGERS IV SOLN: INTRAVENOUS | Status: DC

## 2022-12-27 MED ORDER — ROCURONIUM BROMIDE 10 MG/ML (PF) SYRINGE
PREFILLED_SYRINGE | INTRAVENOUS | Status: AC
  Filled 2022-12-27: qty 10

## 2022-12-27 MED ORDER — ACETAMINOPHEN 160 MG/5ML PO SOLN: 325.0000 mg | ORAL | Status: DC | PRN

## 2022-12-27 MED ORDER — LIDOCAINE 2% (20 MG/ML) 5 ML SYRINGE
INTRAMUSCULAR | Status: DC | PRN
  Administered 2022-12-27: 100 mg via INTRAVENOUS

## 2022-12-27 MED ORDER — ONDANSETRON HCL 4 MG/2ML IJ SOLN
INTRAMUSCULAR | Status: AC
  Filled 2022-12-27: qty 2

## 2022-12-27 MED ORDER — PHENYLEPHRINE 80 MCG/ML (10ML) SYRINGE FOR IV PUSH (FOR BLOOD PRESSURE SUPPORT)
PREFILLED_SYRINGE | INTRAVENOUS | Status: DC | PRN
  Administered 2022-12-27 (×3): 80 ug via INTRAVENOUS

## 2022-12-27 MED ORDER — MIDAZOLAM HCL 2 MG/2ML IJ SOLN
INTRAMUSCULAR | Status: AC
  Filled 2022-12-27: qty 2

## 2022-12-27 MED ORDER — FENTANYL CITRATE (PF) 250 MCG/5ML IJ SOLN
INTRAMUSCULAR | Status: DC | PRN
  Administered 2022-12-27 (×2): 50 ug via INTRAVENOUS

## 2022-12-27 MED ORDER — PROPOFOL 10 MG/ML IV BOLUS
INTRAVENOUS | Status: DC | PRN
  Administered 2022-12-27: 170 mg via INTRAVENOUS

## 2022-12-27 MED ORDER — STERILE WATER FOR IRRIGATION IR SOLN
Status: DC | PRN
  Administered 2022-12-27: 500 mL

## 2022-12-27 MED ORDER — CELECOXIB 200 MG PO CAPS
200.0000 mg | ORAL_CAPSULE | Freq: Once | ORAL | Status: AC
  Administered 2022-12-27: 200 mg via ORAL

## 2022-12-27 MED ORDER — ONDANSETRON HCL 4 MG/2ML IJ SOLN: 4.0000 mg | Freq: Once | INTRAMUSCULAR | Status: DC | PRN

## 2022-12-27 MED ORDER — FLEET ENEMA 7-19 GM/118ML RE ENEM: 1.0000 | ENEMA | Freq: Once | RECTAL | Status: DC

## 2022-12-27 MED ORDER — ACETAMINOPHEN 500 MG PO TABS
1000.0000 mg | ORAL_TABLET | Freq: Once | ORAL | Status: AC
  Administered 2022-12-27: 1000 mg via ORAL

## 2022-12-27 MED ORDER — EPHEDRINE 5 MG/ML INJ
INTRAVENOUS | Status: AC
  Filled 2022-12-27: qty 5

## 2022-12-27 MED ORDER — ACETAMINOPHEN 325 MG PO TABS: 325.0000 mg | ORAL_TABLET | ORAL | Status: DC | PRN

## 2022-12-27 MED ORDER — MEPERIDINE HCL 25 MG/ML IJ SOLN: 6.2500 mg | INTRAMUSCULAR | Status: DC | PRN

## 2022-12-27 MED ORDER — SUGAMMADEX SODIUM 200 MG/2ML IV SOLN
INTRAVENOUS | Status: DC | PRN
  Administered 2022-12-27: 200 mg via INTRAVENOUS

## 2022-12-27 MED ORDER — DEXAMETHASONE SODIUM PHOSPHATE 10 MG/ML IJ SOLN
INTRAMUSCULAR | Status: DC | PRN
  Administered 2022-12-27: 5 mg via INTRAVENOUS

## 2022-12-27 MED ORDER — MIDAZOLAM HCL 2 MG/2ML IJ SOLN
INTRAMUSCULAR | Status: DC | PRN
  Administered 2022-12-27: 1 mg via INTRAVENOUS

## 2022-12-27 MED ORDER — IOHEXOL 300 MG/ML  SOLN
INTRAMUSCULAR | Status: DC | PRN
  Administered 2022-12-27: 3 mL

## 2022-12-27 MED ORDER — FENTANYL CITRATE (PF) 100 MCG/2ML IJ SOLN: 25.0000 ug | INTRAMUSCULAR | Status: DC | PRN

## 2022-12-27 MED ORDER — PHENYLEPHRINE 80 MCG/ML (10ML) SYRINGE FOR IV PUSH (FOR BLOOD PRESSURE SUPPORT)
PREFILLED_SYRINGE | INTRAVENOUS | Status: AC
  Filled 2022-12-27: qty 10

## 2022-12-27 MED ORDER — ROCURONIUM BROMIDE 10 MG/ML (PF) SYRINGE
PREFILLED_SYRINGE | INTRAVENOUS | Status: DC | PRN
  Administered 2022-12-27: 50 mg via INTRAVENOUS
  Administered 2022-12-27: 20 mg via INTRAVENOUS

## 2022-12-27 MED ORDER — CEFAZOLIN SODIUM-DEXTROSE 2-4 GM/100ML-% IV SOLN
2.0000 g | Freq: Once | INTRAVENOUS | Status: AC
  Administered 2022-12-27: 2 g via INTRAVENOUS

## 2022-12-27 MED ORDER — SODIUM CHLORIDE (PF) 0.9 % IJ SOLN
INTRAMUSCULAR | Status: DC | PRN
  Administered 2022-12-27: 10 mL

## 2022-12-27 MED ORDER — DEXAMETHASONE SODIUM PHOSPHATE 10 MG/ML IJ SOLN
INTRAMUSCULAR | Status: AC
  Filled 2022-12-27: qty 1

## 2022-12-27 MED ORDER — ACETAMINOPHEN 500 MG PO TABS
ORAL_TABLET | ORAL | Status: AC
  Filled 2022-12-27: qty 2

## 2022-12-27 MED ORDER — LIDOCAINE HCL (PF) 2 % IJ SOLN
INTRAMUSCULAR | Status: AC
  Filled 2022-12-27: qty 5

## 2022-12-27 MED ORDER — PROPOFOL 10 MG/ML IV BOLUS
INTRAVENOUS | Status: AC
  Filled 2022-12-27: qty 20

## 2022-12-27 MED ORDER — FENTANYL CITRATE (PF) 100 MCG/2ML IJ SOLN
INTRAMUSCULAR | Status: AC
  Filled 2022-12-27: qty 2

## 2022-12-27 MED ORDER — OXYCODONE HCL 5 MG PO TABS: 5.0000 mg | ORAL_TABLET | Freq: Once | ORAL | Status: DC | PRN

## 2022-12-27 MED ORDER — CEFAZOLIN SODIUM-DEXTROSE 2-4 GM/100ML-% IV SOLN
INTRAVENOUS | Status: AC
  Filled 2022-12-27: qty 100

## 2022-12-27 MED ORDER — OXYCODONE HCL 5 MG/5ML PO SOLN: 5.0000 mg | Freq: Once | ORAL | Status: DC | PRN

## 2022-12-27 SURGICAL SUPPLY — 47 items
BAG DRN RND TRDRP ANRFLXCHMBR (UROLOGICAL SUPPLIES) ×2
BAG URINE DRAIN 2000ML AR STRL (UROLOGICAL SUPPLIES) ×2 IMPLANT
BLADE CLIPPER SENSICLIP SURGIC (BLADE) ×2 IMPLANT
BLANKET WARM UPPER BOD BAIR (MISCELLANEOUS) ×2 IMPLANT
CATH COUDE FOLEY 2W 5CC 18FR (CATHETERS) IMPLANT
CATH FOLEY 2WAY SLVR 5CC 16FR (CATHETERS) ×2 IMPLANT
CATH ROBINSON RED A/P 16FR (CATHETERS) IMPLANT
CATH ROBINSON RED A/P 20FR (CATHETERS) ×2 IMPLANT
CLOTH BEACON ORANGE TIMEOUT ST (SAFETY) ×2 IMPLANT
CNTNR URN SCR LID CUP LEK RST (MISCELLANEOUS) ×2 IMPLANT
CONT SPEC 4OZ STRL OR WHT (MISCELLANEOUS) ×2
COVER BACK TABLE 60X90IN (DRAPES) ×2 IMPLANT
COVER MAYO STAND STRL (DRAPES) ×2 IMPLANT
DRSG TEGADERM 4X4.75 (GAUZE/BANDAGES/DRESSINGS) ×2 IMPLANT
DRSG TEGADERM 8X12 (GAUZE/BANDAGES/DRESSINGS) ×2 IMPLANT
GAUZE SPONGE 4X4 12PLY STRL (GAUZE/BANDAGES/DRESSINGS) IMPLANT
GEL ULTRASOUND 20GR AQUASONIC (MISCELLANEOUS) ×4 IMPLANT
GLOVE BIO SURGEON STRL SZ 6.5 (GLOVE) IMPLANT
GLOVE BIO SURGEON STRL SZ7.5 (GLOVE) IMPLANT
GLOVE BIO SURGEON STRL SZ8 (GLOVE) ×4 IMPLANT
GLOVE BIOGEL PI IND STRL 6.5 (GLOVE) IMPLANT
GLOVE SURG ORTHO 8.5 STRL (GLOVE) ×2 IMPLANT
GOWN STRL REUS W/TWL XL LVL3 (GOWN DISPOSABLE) ×2 IMPLANT
GRID BRACH TEMP 18GA 2.8X3X.75 (MISCELLANEOUS) ×2 IMPLANT
HOLDER FOLEY CATH W/STRAP (MISCELLANEOUS) ×2 IMPLANT
IMPL SPACEOAR VUE SYSTEM (Spacer) ×2 IMPLANT
IMPLANT SPACEOAR VUE SYSTEM (Spacer) ×2 IMPLANT
IV NS 1000ML (IV SOLUTION) ×2
IV NS 1000ML BAXH (IV SOLUTION) ×2 IMPLANT
KIT TURNOVER CYSTO (KITS) ×2 IMPLANT
NDL BRACHY 18G 5PK (NEEDLE) ×8 IMPLANT
NDL BRACHY 18G SINGLE (NEEDLE) IMPLANT
NDL PK MORGANSTERN STABILIZ (NEEDLE) ×2 IMPLANT
NEEDLE BRACHY 18G 5PK (NEEDLE) ×8 IMPLANT
NEEDLE BRACHY 18G SINGLE (NEEDLE) IMPLANT
NEEDLE PK MORGANSTERN STABILIZ (NEEDLE) ×2 IMPLANT
PACK CYSTO (CUSTOM PROCEDURE TRAY) ×2 IMPLANT
SHEATH ULTRASOUND LF (SHEATH) IMPLANT
SHEATH ULTRASOUND LTX NONSTRL (SHEATH) IMPLANT
SLEEVE SCD COMPRESS KNEE MED (STOCKING) ×2 IMPLANT
SUT BONE WAX W31G (SUTURE) IMPLANT
SYR 10ML LL (SYRINGE) ×2 IMPLANT
SYR CONTROL 10ML LL (SYRINGE) ×2 IMPLANT
TOWEL OR 17X24 6PK STRL BLUE (TOWEL DISPOSABLE) ×2 IMPLANT
UNDERPAD 30X36 HEAVY ABSORB (UNDERPADS AND DIAPERS) ×4 IMPLANT
WATER STERILE IRR 500ML POUR (IV SOLUTION) ×2 IMPLANT
brachysource i-125 seeds IMPLANT

## 2022-12-27 NOTE — Discharge Instructions (Addendum)
Radioactive Seed Implant Home Care Instructions   Activity:    Rest for the remainder of the day.  Do not drive or operate equipment today.  You may resume normal  activities in a few days as instructed by your physician, without risk of harmful radiation exposure to those around you, provided you follow the time and distance precautions on the Radiation Oncology Instruction Sheet.   Meals: Drink plenty of lipuids and eat light foods, such as gelatin or soup this evening .  You may return to normal meal plan tomorrow.  Return To Work: You may return to work as instructed by Designer, multimedia.  Special Instruction:   If any seeds are found, use tweezers to pick up seeds and place in a glass container of any kind and bring to your physician's office.  Call your physician if any of these symptoms occur:  Persistent or heavy bleeding Urine stream diminishes or stops completely after catheter is removed Fever equal to or greater than 101 degrees F Cloudy urine with a strong foul odor Severe pain  You may feel some burning pain and/or hesitancy when you urinate after the catheter is removed.  These symptoms may increase over the next few weeks, but should diminish within forur to six weeks.  Applying moist heat to the lower abdomen or a hot tub bath may help relieve the pain.  If the discomfort becomes severe, please call your physician for additional medications.  You were given Tylenol and NSAID at 1015am today, you may take Tylenol/ NSAID after 4:30 pm

## 2022-12-27 NOTE — Anesthesia Postprocedure Evaluation (Signed)
Anesthesia Post Note  Patient: [name], PhD  Procedure(s) Performed: RADIOACTIVE SEED IMPLANT/BRACHYTHERAPY IMPLANT (Prostate) SPACE OAR INSTILLATION (Perineum)     Patient location during evaluation: PACU Anesthesia Type: General Level of consciousness: awake and alert Pain management: pain level controlled Vital Signs Assessment: post-procedure vital signs reviewed and stable Respiratory status: spontaneous breathing, nonlabored ventilation, respiratory function stable and patient connected to nasal cannula oxygen Cardiovascular status: blood pressure returned to baseline and stable Postop Assessment: no apparent nausea or vomiting Anesthetic complications: no   No notable events documented.  Last Vitals:  Vitals:   12/27/22 1430 12/27/22 1459  BP: (!) 142/76 (!) 153/76  Pulse: (!) 42 (!) 42  Resp: (!) 21 15  Temp: (!) 36.3 C (!) 36.3 C  SpO2: 100% 100%    Last Pain:  Vitals:   12/27/22 1459  TempSrc:   PainSc: 0-No pain                 Najwa Spillane

## 2022-12-27 NOTE — Interval H&P Note (Signed)
History and Physical Interval Note:  12/27/2022 11:39 AM  Alan Brave, Alan Gonzalez  has presented today for surgery, with the diagnosis of PROSTATE CANCER.  The various methods of treatment have been discussed with the patient and family. After consideration of risks, benefits and other options for treatment, the patient has consented to  Procedure(s) with comments: RADIOACTIVE SEED IMPLANT/BRACHYTHERAPY IMPLANT (N/A) - 90 MINS SPACE OAR INSTILLATION (N/A) as a surgical intervention.  The patient's history has been reviewed, patient examined, no change in status, stable for surgery.  I have reviewed the patient's chart and labs.  Questions were answered to the patient's satisfaction.     Bertram Millard Shanine Kreiger

## 2022-12-27 NOTE — Anesthesia Preprocedure Evaluation (Addendum)
Anesthesia Evaluation  Patient identified by MRN, date of birth, ID band Patient awake    Reviewed: Allergy & Precautions, H&P , NPO status , Patient's Chart, lab work & pertinent test results  Airway Mallampati: II  TM Distance: >3 FB Neck ROM: Full    Dental no notable dental hx. (+) Teeth Intact, Dental Advisory Given   Pulmonary neg pulmonary ROS   Pulmonary exam normal breath sounds clear to auscultation       Cardiovascular Exercise Tolerance: Good hypertension, Pt. on medications negative cardio ROS Normal cardiovascular exam Rhythm:Regular Rate:Normal     Neuro/Psych  Neuromuscular disease  negative psych ROS   GI/Hepatic negative GI ROS, Neg liver ROS,,,  Endo/Other  negative endocrine ROS    Renal/GU Renal diseasenegative Renal ROS  negative genitourinary   Musculoskeletal negative musculoskeletal ROS (+)    Abdominal   Peds negative pediatric ROS (+)  Hematology negative hematology ROS (+)   Anesthesia Other Findings   Reproductive/Obstetrics negative OB ROS                             Anesthesia Physical Anesthesia Plan  ASA: 3  Anesthesia Plan: General   Post-op Pain Management: Minimal or no pain anticipated, Tylenol PO (pre-op)* and Celebrex PO (pre-op)*   Induction: Intravenous  PONV Risk Score and Plan: 2 and Ondansetron, Dexamethasone and Treatment may vary due to age or medical condition  Airway Management Planned: LMA and Oral ETT  Additional Equipment: None  Intra-op Plan:   Post-operative Plan:   Informed Consent: I have reviewed the patients History and Physical, chart, labs and discussed the procedure including the risks, benefits and alternatives for the proposed anesthesia with the patient or authorized representative who has indicated his/her understanding and acceptance.       Plan Discussed with: CRNA and Anesthesiologist  Anesthesia Plan  Comments: ( )       Anesthesia Quick Evaluation

## 2022-12-27 NOTE — Anesthesia Procedure Notes (Signed)
Procedure Name: Intubation Date/Time: 12/27/2022 12:23 PM  Performed by: Dairl Ponder, CRNAPre-anesthesia Checklist: Patient identified, Emergency Drugs available, Suction available and Patient being monitored Patient Re-evaluated:Patient Re-evaluated prior to induction Oxygen Delivery Method: Circle System Utilized Preoxygenation: Pre-oxygenation with 100% oxygen Induction Type: IV induction Ventilation: Mask ventilation without difficulty Laryngoscope Size: Mac and 4 Grade View: Grade III Tube type: Oral Tube size: 7.5 mm Number of attempts: 1 Airway Equipment and Method: Stylet and Oral airway Placement Confirmation: ETT inserted through vocal cords under direct vision, positive ETCO2 and breath sounds checked- equal and bilateral Secured at: 24 cm Tube secured with: Tape Dental Injury: Teeth and Oropharynx as per pre-operative assessment

## 2022-12-27 NOTE — Transfer of Care (Signed)
Immediate Anesthesia Transfer of Care Note  Patient: Alan Brave, PhD  Procedure(s) Performed: RADIOACTIVE SEED IMPLANT/BRACHYTHERAPY IMPLANT (Prostate) SPACE OAR INSTILLATION (Perineum)  Patient Location: PACU  Anesthesia Type:General  Level of Consciousness: drowsy and patient cooperative  Airway & Oxygen Therapy: Patient Spontanous Breathing and Patient connected to nasal cannula oxygen  Post-op Assessment: Report given to RN and Post -op Vital signs reviewed and stable  Post vital signs: Reviewed and stable  Last Vitals:  Vitals Value Taken Time  BP    Temp    Pulse    Resp    SpO2      Last Pain:  Vitals:   12/27/22 1011  TempSrc: Oral  PainSc: 0-No pain      Patients Stated Pain Goal: 6 (12/27/22 1011)  Complications: No notable events documented.

## 2022-12-27 NOTE — Op Note (Signed)
Preoperative diagnosis: Clinical stage TI C adenocarcinoma the prostate   Postoperative diagnosis: Same   Procedure: I-125 prostate seed implantation, SpaceOAR placement, flexible cystoscopy  Surgeon: Bertram Millard. Derald Lorge M.D.  Radiation Oncologist: Margaretmary Dys, M.D.  Anesthesia: Gen.   Indications: Patient  was diagnosed with high risk prostate cancer. We had extensive discussion with him about treatment options versus. He elected to proceed with seed boost followed by EBRT, in addition to LT ADT. He underwent consultation my office as well as with Dr. Kathrynn Running. He appeared to understand the advantages disadvantages potential risks of this treatment option. Full informed consent has been obtained.   Technique and findings: Patient was brought the operating room where he had successful induction of general anesthesia. He was placed in dorso-lithotomy position and prepped and draped in usual manner. Appropriate surgical timeout was performed. Radiation oncology department placed a transrectal ultrasound probe anchoring stand. Foley catheter with contrast in the balloon was inserted  initially without difficulty, but I was called down early as it appeared that the foley balloon was inflated w/in the prostatic urethra. . I removed the catheter and placed an 18 fro coude catheter w/o difficulty. Both catheter irrigation and TRUS revealed adequate catheter placement. Anchoring needles were placed within the prostate. Rectal tube was placed. Real-time contouring of the urethra prostate and rectum were performed and the dosing parameters were established. Targeted dose was 110 gray.  I was then called  to the operating suite suite for placement of the needles. A second timeout was performed. All needle passage was done with real-time transrectal ultrasound guidance with the sagittal plane. A total of 15 needles were placed.  61 active seeds were implanted.  I then proceeded with placement of SpaceOAR by  introducing a needle with the bevel angled inferiorly approximately 2 cm superior to the anus. This was angled downward and under direct ultrasound was placed within the space between the prostatic capsule and rectum. This was confirmed with a small amount of sterile saline injected and this was performed under direct ultrasound. I then attached the SpaceOAR to the needle and injected this in the space between the prostate and rectum with good placement noted. The Foley catheter was removed and flexible cystoscopy failed to show any seeds outside the prostate.  The patient was brought to recovery room in stable condition, having tolerated the procedure well.Marland Kitchen

## 2022-12-27 NOTE — Progress Notes (Signed)
Radiation Oncology         [phone]) [phone] ________________________________  Name: Alan Brave, PhD MRN: [id number]  Date: 12/27/2022  DOB: August 18, 1945       Prostate Seed Implant  WJ:XBJYNW, Katina Degree, MD  No ref. provider found  DIAGNOSIS:  77 y.o. gentleman with Stage T2 adenocarcinoma of the prostate with Gleason score of 4+5, and PSA of 14.9.    Oncology History  Prostate cancer (HCC)  08/23/2022 Cancer Staging   Staging form: Prostate, AJCC 8th Edition - Clinical stage from 08/23/2022: Stage IVA (cT2, cN1, cM0, PSA: 14.9, Grade Group: 5) - Signed by Marcello Fennel, PA-C on 11/20/2022 Histopathologic type: Adenocarcinoma, NOS Stage prefix: Initial diagnosis Prostate specific antigen (PSA) range: 10 to 19 Gleason primary pattern: 4 Gleason secondary pattern: 5 Gleason score: 9 Histologic grading system: 5 grade system Number of biopsy cores examined: 12 Number of biopsy cores positive: 12 Location of positive needle core biopsies: Both sides   08/28/2022 Initial Diagnosis   Prostate cancer (HCC)       ICD-10-CM   1. Pre-op testing  Z01.818 CBG per Guidelines for Diabetes Management for Patients Undergoing Surgery ([facility], [facility], and [facility] only)    CBG per protocol    I-Stat, Chem 8 on day of surgery per protocol    CBG per Guidelines for Diabetes Management for Patients Undergoing Surgery ([facility], [facility], and [facility] only)    CBG per protocol    I-Stat, Chem 8 on day of surgery per protocol      PROCEDURE: Insertion of radioactive I-125 seeds into the prostate gland.  RADIATION DOSE: 110 Gy, boost therapy.  TECHNIQUE: Alan Brave, PhD was brought to the operating room with the urologist. He was placed in the dorsolithotomy position. He was catheterized and a rectal tube was inserted. The perineum was shaved, prepped and draped. The ultrasound probe was then introduced by me into the rectum to see the prostate gland.  TREATMENT DEVICE: I attached the needle grid to the ultrasound probe  stand and anchor needles were placed.  3D PLANNING: The prostate was imaged in 3D using a sagittal sweep of the prostate probe. These images were transferred to the planning computer. There, the prostate, urethra and rectum were defined on each axial reconstructed image. Then, the software created an optimized 3D plan and a few seed positions were adjusted. The quality of the plan was reviewed using [facility] information for the target and the following two organs at risk:  Urethra and Rectum.  Then the accepted plan was printed and handed off to the radiation therapist.  Under my supervision, the custom loading of the seeds and spacers was carried out using the quick loader.  These pre-loaded needles were then placed into the needle holder.Marland Kitchen  PROSTATE VOLUME STUDY:  Using transrectal ultrasound the volume of the prostate was verified to be 48 cc.  SPECIAL TREATMENT PROCEDURE/SUPERVISION AND HANDLING: The pre-loaded needles were then delivered by the urologist under sagittal guidance. A total of 15 needles were used to deposit 61 seeds in the prostate gland. The individual seed activity was 0.395 mCi.  SpaceOAR:  Yes  COMPLEX SIMULATION: At the end of the procedure, an anterior radiograph of the pelvis was obtained to document seed positioning and count. Cystoscopy was performed by the urologist to check the urethra and bladder.  MICRODOSIMETRY: At the end of the procedure, the patient was emitting 0.10 mR/hr at 1 meter. Accordingly, he was considered safe for hospital discharge.  PLAN: The patient will  return to the radiation oncology clinic for post implant CT dosimetry in three weeks.   ________________________________  Artist Pais Kathrynn Running, M.D.

## 2022-12-30 ENCOUNTER — Encounter (HOSPITAL_BASED_OUTPATIENT_CLINIC_OR_DEPARTMENT_OTHER): Payer: Self-pay | Admitting: Urology

## 2023-01-02 NOTE — Progress Notes (Signed)
RN left message for call back to assess any new barriers or needs prior to start of daily radiation treatment.

## 2023-01-02 NOTE — Progress Notes (Signed)
Pt was a consult on 3/18 for his Stage T2 adenocarcinoma of the prostate with Gleason score of 4+5, and PSA of 14.9. Proceeded with ADT, brachy boost on 6/7, and will have his CT Simulation on 6/26.    RN placed call to follow up to assess any needs prior to starting his daily radiation.  Patient reports waking up around 9 times a night, very minimal amount of urine, difficult to start stream, and when stream starts it stops and starts. Abdomen with minimal tightness. He is currently now taking Flomax once a day, and Ibuprofen 800mg  TID.  Patient has a history of having to self cath due to retention.    RN reviewed with provider for additional recommendations of increasing to BID Flomax, and encouraged to take both doses at night.  Continue the ibuprofen to help with the inflammation and if the increased dose of Flomax isn't significantly helping, he was educated to do his own PVR test with his self cath at home where he knows to attempt emptying his bladder, and then self cath and measure how much urine is remaining in his bladder. If more than 10 ounces, he understands to start self-cathing before bed each night to see if that will allow him to get more rest.   Patient verbalized understanding and agreement.

## 2023-01-14 ENCOUNTER — Telehealth: Payer: Self-pay | Admitting: *Deleted

## 2023-01-14 NOTE — Telephone Encounter (Signed)
CALLED PATIENT TO REMIND OF SIM APPT. FOR 01-15-23- ARRIVAL TIME- 1:45 PM @ [facility], SPOKE WITH PATIENT AND HE IS AWARE OF THIS APPT.

## 2023-01-15 ENCOUNTER — Other Ambulatory Visit: Payer: Self-pay

## 2023-01-15 ENCOUNTER — Ambulatory Visit
Admission: RE | Admit: 2023-01-15 | Discharge: 2023-01-15 | Disposition: A | Discharge status: Home / Self Care | Payer: Medicare Other | Source: Ambulatory Visit | Attending: Radiation Oncology | Admitting: Radiation Oncology

## 2023-01-15 DIAGNOSIS — Z191 Hormone sensitive malignancy status: Secondary | ICD-10-CM | POA: Diagnosis not present

## 2023-01-15 DIAGNOSIS — R338 Other retention of urine: Secondary | ICD-10-CM | POA: Diagnosis not present

## 2023-01-15 DIAGNOSIS — N401 Enlarged prostate with lower urinary tract symptoms: Secondary | ICD-10-CM | POA: Diagnosis not present

## 2023-01-15 DIAGNOSIS — C61 Malignant neoplasm of prostate: Secondary | ICD-10-CM | POA: Insufficient documentation

## 2023-01-15 NOTE — Progress Notes (Signed)
  Radiation Oncology         [phone]) [phone] ________________________________  Name: Alan Brave, PhD MRN: [id number]  Date: 01/15/2023  DOB: [birth year]  SIMULATION AND TREATMENT PLANNING NOTE    ICD-10-CM   1. Prostate cancer (HCC)  C61       DIAGNOSIS:   77 y.o. gentleman with Stage T2 adenocarcinoma of the prostate with Gleason score of 4+5, and PSA of 14.9.   NARRATIVE:  The patient was brought to the CT Simulation planning suite.  Identity was confirmed.  All relevant records and images related to the planned course of therapy were reviewed.  The patient freely provided informed written consent to proceed with treatment after reviewing the details related to the planned course of therapy. The consent form was witnessed and verified by the simulation staff.  Then, the patient was set-up in a stable reproducible supine position for radiation therapy.  A vacuum lock pillow device was custom fabricated to position his legs in a reproducible immobilized position.  Then, I performed a urethrogram under sterile conditions to identify the prostatic apex.  CT images were obtained.  Surface markings were placed.  The CT images were loaded into the planning software.  Then the prostate target and avoidance structures including the rectum, bladder, bowel and hips were contoured.  Treatment planning then occurred.  The radiation prescription was entered and confirmed.  A total of one complex treatment devices were fabricated. I have requested : Intensity Modulated Radiotherapy (IMRT) is medically necessary for this case for the following reason:  Rectal sparing.Marland Kitchen  PLAN:  The patient will receive 45 Gy in 25 fractions of 1.8 Gy, to supplement an up-front prostate seed implant boost of 110 Gy to achieve a total nominal dose of 155 Gy.  During IMRT, the single PET-avid node will receive 62.5 Gy in 25 fractions of 2.5 Gy using simultaneous integrated boost.  ________________________________  Artist Pais.  Kathrynn Running, M.D.

## 2023-01-15 NOTE — Progress Notes (Signed)
  Radiation Oncology         [phone]) [phone] ________________________________  Name: Alan Brave, PhD MRN: [id number]  Date: 01/15/2023  DOB: 30-Mar-1946  COMPLEX SIMULATION NOTE  NARRATIVE:  The patient was brought to the CT Simulation planning suite today following prostate seed implantation approximately one month ago.  Identity was confirmed.  All relevant records and images related to the planned course of therapy were reviewed.  Then, the patient was set-up supine.  CT images were obtained.  The CT images were loaded into the planning software.  Then the prostate and rectum were contoured.  Treatment planning then occurred.  The implanted iodine 125 seeds were identified by the physics staff for projection of radiation distribution  I have requested : 3D Simulation  I have requested a DVH of the following structures: Prostate and rectum.    ________________________________  Artist Pais Kathrynn Running, M.D.

## 2023-01-16 ENCOUNTER — Encounter: Payer: Self-pay | Admitting: Radiation Oncology

## 2023-01-16 DIAGNOSIS — C61 Malignant neoplasm of prostate: Secondary | ICD-10-CM | POA: Diagnosis not present

## 2023-01-16 DIAGNOSIS — Z191 Hormone sensitive malignancy status: Secondary | ICD-10-CM | POA: Diagnosis not present

## 2023-01-16 NOTE — Progress Notes (Signed)
  Radiation Oncology         [phone]) [phone] ________________________________  Name: Alan Brave, Alan Gonzalez MRN: [id number]  Date: 01/16/2023  DOB: Dec 03, 1945  3D Planning Note   Prostate Brachytherapy Post-Implant Dosimetry  Diagnosis: 77 y.o. gentleman with Stage T2 adenocarcinoma of the prostate with Gleason score of 4+5, and PSA of 14.9.   Narrative: On a previous date, Alan Brave, Alan Gonzalez returned following prostate seed implantation for post implant planning. He underwent CT scan complex simulation to delineate the three-dimensional structures of the pelvis and demonstrate the radiation distribution.  Since that time, the seed localization, and complex isodose planning with dose volume histograms have now been completed.  Results:   Prostate Coverage - The dose of radiation delivered to the 90% or more of the prostate gland (D90) was 107.78% of the prescription dose. This exceeds our goal of greater than 90%. Rectal Sparing - The volume of rectal tissue receiving the prescription dose or higher was 0.0 cc. This falls under our thresholds tolerance of 1.0 cc.  Impression: The prostate seed implant appears to show adequate target coverage and appropriate rectal sparing.  Plan:  The patient will continue to follow with urology for ongoing PSA determinations. I would anticipate a high likelihood for local tumor control with minimal risk for rectal morbidity.  ________________________________  Artist Pais Kathrynn Running, M.D.

## 2023-01-22 DIAGNOSIS — C61 Malignant neoplasm of prostate: Secondary | ICD-10-CM | POA: Diagnosis not present

## 2023-01-24 DIAGNOSIS — C61 Malignant neoplasm of prostate: Secondary | ICD-10-CM | POA: Diagnosis not present

## 2023-01-24 DIAGNOSIS — Z191 Hormone sensitive malignancy status: Secondary | ICD-10-CM | POA: Diagnosis not present

## 2023-01-24 DIAGNOSIS — R339 Retention of urine, unspecified: Secondary | ICD-10-CM | POA: Diagnosis not present

## 2023-01-27 ENCOUNTER — Other Ambulatory Visit: Payer: Self-pay

## 2023-01-27 ENCOUNTER — Ambulatory Visit
Admission: RE | Admit: 2023-01-27 | Discharge: 2023-01-27 | Disposition: A | Discharge status: Home / Self Care | Payer: Medicare Other | Source: Ambulatory Visit | Attending: Radiation Oncology | Admitting: Radiation Oncology

## 2023-01-27 DIAGNOSIS — C61 Malignant neoplasm of prostate: Secondary | ICD-10-CM | POA: Diagnosis not present

## 2023-01-27 DIAGNOSIS — Z510 Encounter for antineoplastic radiation therapy: Secondary | ICD-10-CM | POA: Diagnosis not present

## 2023-01-27 DIAGNOSIS — Z191 Hormone sensitive malignancy status: Secondary | ICD-10-CM | POA: Diagnosis not present

## 2023-01-27 LAB — RAD ONC ARIA SESSION SUMMARY
Course Elapsed Days: 0
Plan Fractions Treated to Date: 1
Plan Prescribed Dose Per Fraction: 1.8 Gy
Plan Total Fractions Prescribed: 25
Plan Total Prescribed Dose: 45 Gy
Reference Point Dosage Given to Date: 1.8 Gy
Reference Point Session Dosage Given: 1.8 Gy
Session Number: 1

## 2023-01-28 ENCOUNTER — Other Ambulatory Visit: Payer: Self-pay

## 2023-01-28 ENCOUNTER — Ambulatory Visit
Admission: RE | Admit: 2023-01-28 | Discharge: 2023-01-28 | Disposition: A | Discharge status: Home / Self Care | Payer: Medicare Other | Source: Ambulatory Visit | Attending: Radiation Oncology | Admitting: Radiation Oncology

## 2023-01-28 DIAGNOSIS — C61 Malignant neoplasm of prostate: Secondary | ICD-10-CM | POA: Diagnosis not present

## 2023-01-28 DIAGNOSIS — Z510 Encounter for antineoplastic radiation therapy: Secondary | ICD-10-CM | POA: Diagnosis not present

## 2023-01-28 DIAGNOSIS — Z191 Hormone sensitive malignancy status: Secondary | ICD-10-CM | POA: Diagnosis not present

## 2023-01-28 LAB — RAD ONC ARIA SESSION SUMMARY
Course Elapsed Days: 1
Plan Fractions Treated to Date: 2
Plan Prescribed Dose Per Fraction: 1.8 Gy
Plan Total Fractions Prescribed: 25
Plan Total Prescribed Dose: 45 Gy
Reference Point Dosage Given to Date: 3.6 Gy
Reference Point Session Dosage Given: 1.8 Gy
Session Number: 2

## 2023-01-29 ENCOUNTER — Other Ambulatory Visit: Payer: Self-pay

## 2023-01-29 ENCOUNTER — Ambulatory Visit
Admission: RE | Admit: 2023-01-29 | Discharge: 2023-01-29 | Disposition: A | Discharge status: Home / Self Care | Payer: Medicare Other | Source: Ambulatory Visit | Attending: Radiation Oncology | Admitting: Radiation Oncology

## 2023-01-29 DIAGNOSIS — C61 Malignant neoplasm of prostate: Secondary | ICD-10-CM | POA: Diagnosis not present

## 2023-01-29 DIAGNOSIS — Z510 Encounter for antineoplastic radiation therapy: Secondary | ICD-10-CM | POA: Diagnosis not present

## 2023-01-29 DIAGNOSIS — Z191 Hormone sensitive malignancy status: Secondary | ICD-10-CM | POA: Diagnosis not present

## 2023-01-29 LAB — RAD ONC ARIA SESSION SUMMARY
Course Elapsed Days: 2
Plan Fractions Treated to Date: 3
Plan Prescribed Dose Per Fraction: 1.8 Gy
Plan Total Fractions Prescribed: 25
Plan Total Prescribed Dose: 45 Gy
Reference Point Dosage Given to Date: 5.4 Gy
Reference Point Session Dosage Given: 1.8 Gy
Session Number: 3

## 2023-01-30 ENCOUNTER — Ambulatory Visit
Admission: RE | Admit: 2023-01-30 | Discharge: 2023-01-30 | Disposition: A | Discharge status: Home / Self Care | Payer: Medicare Other | Source: Ambulatory Visit | Attending: Radiation Oncology | Admitting: Radiation Oncology

## 2023-01-30 ENCOUNTER — Other Ambulatory Visit: Payer: Self-pay

## 2023-01-30 DIAGNOSIS — Z510 Encounter for antineoplastic radiation therapy: Secondary | ICD-10-CM | POA: Diagnosis not present

## 2023-01-30 DIAGNOSIS — C61 Malignant neoplasm of prostate: Secondary | ICD-10-CM | POA: Diagnosis not present

## 2023-01-30 DIAGNOSIS — Z191 Hormone sensitive malignancy status: Secondary | ICD-10-CM | POA: Diagnosis not present

## 2023-01-30 LAB — RAD ONC ARIA SESSION SUMMARY
Course Elapsed Days: 3
Plan Fractions Treated to Date: 4
Plan Prescribed Dose Per Fraction: 1.8 Gy
Plan Total Fractions Prescribed: 25
Plan Total Prescribed Dose: 45 Gy
Reference Point Dosage Given to Date: 7.2 Gy
Reference Point Session Dosage Given: 1.8 Gy
Session Number: 4

## 2023-01-31 ENCOUNTER — Other Ambulatory Visit: Payer: Self-pay

## 2023-01-31 ENCOUNTER — Ambulatory Visit: Admission: RE | Admit: 2023-01-31 | Payer: Medicare Other | Source: Ambulatory Visit

## 2023-01-31 ENCOUNTER — Ambulatory Visit
Admission: RE | Admit: 2023-01-31 | Discharge: 2023-01-31 | Disposition: A | Discharge status: Home / Self Care | Payer: Medicare Other | Source: Ambulatory Visit | Attending: Radiation Oncology | Admitting: Radiation Oncology

## 2023-01-31 ENCOUNTER — Encounter: Payer: Self-pay | Admitting: Family Medicine

## 2023-01-31 DIAGNOSIS — C61 Malignant neoplasm of prostate: Secondary | ICD-10-CM | POA: Diagnosis not present

## 2023-01-31 DIAGNOSIS — Z510 Encounter for antineoplastic radiation therapy: Secondary | ICD-10-CM | POA: Diagnosis not present

## 2023-01-31 DIAGNOSIS — I10 Essential (primary) hypertension: Secondary | ICD-10-CM

## 2023-01-31 DIAGNOSIS — Z191 Hormone sensitive malignancy status: Secondary | ICD-10-CM | POA: Diagnosis not present

## 2023-01-31 LAB — RAD ONC ARIA SESSION SUMMARY
Course Elapsed Days: 4
Plan Fractions Treated to Date: 5
Plan Prescribed Dose Per Fraction: 1.8 Gy
Plan Total Fractions Prescribed: 25
Plan Total Prescribed Dose: 45 Gy
Reference Point Dosage Given to Date: 9 Gy
Reference Point Session Dosage Given: 1.8 Gy
Session Number: 5

## 2023-01-31 NOTE — Telephone Encounter (Signed)
Last refill by historical provider  

## 2023-02-03 ENCOUNTER — Other Ambulatory Visit: Payer: Self-pay

## 2023-02-03 ENCOUNTER — Ambulatory Visit: Admission: RE | Admit: 2023-02-03 | Payer: Medicare Other | Source: Ambulatory Visit

## 2023-02-03 DIAGNOSIS — Z510 Encounter for antineoplastic radiation therapy: Secondary | ICD-10-CM | POA: Diagnosis not present

## 2023-02-03 DIAGNOSIS — Z191 Hormone sensitive malignancy status: Secondary | ICD-10-CM | POA: Diagnosis not present

## 2023-02-03 DIAGNOSIS — C61 Malignant neoplasm of prostate: Secondary | ICD-10-CM | POA: Diagnosis not present

## 2023-02-03 LAB — RAD ONC ARIA SESSION SUMMARY
Course Elapsed Days: 7
Plan Fractions Treated to Date: 6
Plan Prescribed Dose Per Fraction: 1.8 Gy
Plan Total Fractions Prescribed: 25
Plan Total Prescribed Dose: 45 Gy
Reference Point Dosage Given to Date: 10.8 Gy
Reference Point Session Dosage Given: 1.8 Gy
Session Number: 6

## 2023-02-03 MED ORDER — AMLODIPINE BESYLATE 5 MG PO TABS: 5.0000 mg | ORAL_TABLET | Freq: Every day | ORAL | 3 refills | Status: DC

## 2023-02-04 ENCOUNTER — Ambulatory Visit
Admission: RE | Admit: 2023-02-04 | Discharge: 2023-02-04 | Disposition: A | Discharge status: Home / Self Care | Payer: Medicare Other | Source: Ambulatory Visit | Attending: Radiation Oncology | Admitting: Radiation Oncology

## 2023-02-04 ENCOUNTER — Other Ambulatory Visit: Payer: Self-pay

## 2023-02-04 DIAGNOSIS — Z510 Encounter for antineoplastic radiation therapy: Secondary | ICD-10-CM | POA: Diagnosis not present

## 2023-02-04 DIAGNOSIS — C61 Malignant neoplasm of prostate: Secondary | ICD-10-CM | POA: Diagnosis not present

## 2023-02-04 DIAGNOSIS — Z191 Hormone sensitive malignancy status: Secondary | ICD-10-CM | POA: Diagnosis not present

## 2023-02-04 LAB — RAD ONC ARIA SESSION SUMMARY
Course Elapsed Days: 8
Plan Fractions Treated to Date: 7
Plan Prescribed Dose Per Fraction: 1.8 Gy
Plan Total Fractions Prescribed: 25
Plan Total Prescribed Dose: 45 Gy
Reference Point Dosage Given to Date: 12.6 Gy
Reference Point Session Dosage Given: 1.8 Gy
Session Number: 7

## 2023-02-05 ENCOUNTER — Other Ambulatory Visit: Payer: Self-pay

## 2023-02-05 ENCOUNTER — Encounter: Payer: Self-pay | Admitting: Family Medicine

## 2023-02-05 ENCOUNTER — Ambulatory Visit
Admission: RE | Admit: 2023-02-05 | Discharge: 2023-02-05 | Disposition: A | Discharge status: Home / Self Care | Payer: Medicare Other | Source: Ambulatory Visit | Attending: Radiation Oncology | Admitting: Radiation Oncology

## 2023-02-05 DIAGNOSIS — C61 Malignant neoplasm of prostate: Secondary | ICD-10-CM | POA: Diagnosis not present

## 2023-02-05 DIAGNOSIS — Z191 Hormone sensitive malignancy status: Secondary | ICD-10-CM | POA: Diagnosis not present

## 2023-02-05 DIAGNOSIS — Z510 Encounter for antineoplastic radiation therapy: Secondary | ICD-10-CM | POA: Diagnosis not present

## 2023-02-05 LAB — RAD ONC ARIA SESSION SUMMARY
Course Elapsed Days: 9
Plan Fractions Treated to Date: 8
Plan Prescribed Dose Per Fraction: 1.8 Gy
Plan Total Fractions Prescribed: 25
Plan Total Prescribed Dose: 45 Gy
Reference Point Dosage Given to Date: 14.4 Gy
Reference Point Session Dosage Given: 1.8 Gy
Session Number: 8

## 2023-02-06 ENCOUNTER — Other Ambulatory Visit: Payer: Self-pay

## 2023-02-06 ENCOUNTER — Ambulatory Visit
Admission: RE | Admit: 2023-02-06 | Discharge: 2023-02-06 | Disposition: A | Discharge status: Home / Self Care | Payer: Medicare Other | Source: Ambulatory Visit | Attending: Radiation Oncology | Admitting: Radiation Oncology

## 2023-02-06 DIAGNOSIS — Z191 Hormone sensitive malignancy status: Secondary | ICD-10-CM | POA: Diagnosis not present

## 2023-02-06 DIAGNOSIS — Z510 Encounter for antineoplastic radiation therapy: Secondary | ICD-10-CM | POA: Diagnosis not present

## 2023-02-06 DIAGNOSIS — C61 Malignant neoplasm of prostate: Secondary | ICD-10-CM | POA: Diagnosis not present

## 2023-02-06 LAB — RAD ONC ARIA SESSION SUMMARY
Course Elapsed Days: 10
Plan Fractions Treated to Date: 9
Plan Prescribed Dose Per Fraction: 1.8 Gy
Plan Total Fractions Prescribed: 25
Plan Total Prescribed Dose: 45 Gy
Reference Point Dosage Given to Date: 16.2 Gy
Reference Point Session Dosage Given: 1.8 Gy
Session Number: 9

## 2023-02-07 ENCOUNTER — Ambulatory Visit
Admission: RE | Admit: 2023-02-07 | Discharge: 2023-02-07 | Disposition: A | Discharge status: Home / Self Care | Payer: Medicare Other | Source: Ambulatory Visit | Attending: Radiation Oncology | Admitting: Radiation Oncology

## 2023-02-07 ENCOUNTER — Other Ambulatory Visit: Payer: Self-pay

## 2023-02-07 DIAGNOSIS — Z510 Encounter for antineoplastic radiation therapy: Secondary | ICD-10-CM | POA: Diagnosis not present

## 2023-02-07 DIAGNOSIS — Z191 Hormone sensitive malignancy status: Secondary | ICD-10-CM | POA: Diagnosis not present

## 2023-02-07 DIAGNOSIS — C61 Malignant neoplasm of prostate: Secondary | ICD-10-CM | POA: Diagnosis not present

## 2023-02-07 LAB — RAD ONC ARIA SESSION SUMMARY
Course Elapsed Days: 11
Plan Fractions Treated to Date: 10
Plan Prescribed Dose Per Fraction: 1.8 Gy
Plan Total Fractions Prescribed: 25
Plan Total Prescribed Dose: 45 Gy
Reference Point Dosage Given to Date: 18 Gy
Reference Point Session Dosage Given: 1.8 Gy
Session Number: 10

## 2023-02-10 ENCOUNTER — Other Ambulatory Visit: Payer: Self-pay

## 2023-02-10 ENCOUNTER — Ambulatory Visit
Admission: RE | Admit: 2023-02-10 | Discharge: 2023-02-10 | Disposition: A | Discharge status: Home / Self Care | Payer: Medicare Other | Source: Ambulatory Visit | Attending: Radiation Oncology | Admitting: Radiation Oncology

## 2023-02-10 DIAGNOSIS — C61 Malignant neoplasm of prostate: Secondary | ICD-10-CM | POA: Diagnosis not present

## 2023-02-10 DIAGNOSIS — Z510 Encounter for antineoplastic radiation therapy: Secondary | ICD-10-CM | POA: Diagnosis not present

## 2023-02-10 DIAGNOSIS — Z191 Hormone sensitive malignancy status: Secondary | ICD-10-CM | POA: Diagnosis not present

## 2023-02-10 LAB — RAD ONC ARIA SESSION SUMMARY
Course Elapsed Days: 14
Plan Fractions Treated to Date: 11
Plan Prescribed Dose Per Fraction: 1.8 Gy
Plan Total Fractions Prescribed: 25
Plan Total Prescribed Dose: 45 Gy
Reference Point Dosage Given to Date: 19.8 Gy
Reference Point Session Dosage Given: 1.8 Gy
Session Number: 11

## 2023-02-11 ENCOUNTER — Ambulatory Visit
Admission: RE | Admit: 2023-02-11 | Discharge: 2023-02-11 | Disposition: A | Discharge status: Home / Self Care | Payer: Medicare Other | Source: Ambulatory Visit | Attending: Radiation Oncology | Admitting: Radiation Oncology

## 2023-02-11 ENCOUNTER — Other Ambulatory Visit: Payer: Self-pay

## 2023-02-11 DIAGNOSIS — C61 Malignant neoplasm of prostate: Secondary | ICD-10-CM | POA: Diagnosis not present

## 2023-02-11 DIAGNOSIS — Z510 Encounter for antineoplastic radiation therapy: Secondary | ICD-10-CM | POA: Diagnosis not present

## 2023-02-11 DIAGNOSIS — Z191 Hormone sensitive malignancy status: Secondary | ICD-10-CM | POA: Diagnosis not present

## 2023-02-11 LAB — RAD ONC ARIA SESSION SUMMARY
Course Elapsed Days: 15
Plan Fractions Treated to Date: 12
Plan Prescribed Dose Per Fraction: 1.8 Gy
Plan Total Fractions Prescribed: 25
Plan Total Prescribed Dose: 45 Gy
Reference Point Dosage Given to Date: 21.6 Gy
Reference Point Session Dosage Given: 1.8 Gy
Session Number: 12

## 2023-02-12 ENCOUNTER — Ambulatory Visit
Admission: RE | Admit: 2023-02-12 | Discharge: 2023-02-12 | Disposition: A | Discharge status: Home / Self Care | Payer: Medicare Other | Source: Ambulatory Visit | Attending: Radiation Oncology | Admitting: Radiation Oncology

## 2023-02-12 ENCOUNTER — Other Ambulatory Visit: Payer: Self-pay

## 2023-02-12 DIAGNOSIS — Z510 Encounter for antineoplastic radiation therapy: Secondary | ICD-10-CM | POA: Diagnosis not present

## 2023-02-12 DIAGNOSIS — C61 Malignant neoplasm of prostate: Secondary | ICD-10-CM | POA: Diagnosis not present

## 2023-02-12 DIAGNOSIS — Z191 Hormone sensitive malignancy status: Secondary | ICD-10-CM | POA: Diagnosis not present

## 2023-02-12 LAB — RAD ONC ARIA SESSION SUMMARY
Course Elapsed Days: 16
Plan Fractions Treated to Date: 13
Plan Prescribed Dose Per Fraction: 1.8 Gy
Plan Total Fractions Prescribed: 25
Plan Total Prescribed Dose: 45 Gy
Reference Point Dosage Given to Date: 23.4 Gy
Reference Point Session Dosage Given: 1.8 Gy
Session Number: 13

## 2023-02-13 ENCOUNTER — Other Ambulatory Visit: Payer: Self-pay

## 2023-02-13 ENCOUNTER — Ambulatory Visit
Admission: RE | Admit: 2023-02-13 | Discharge: 2023-02-13 | Disposition: A | Discharge status: Home / Self Care | Payer: Medicare Other | Source: Ambulatory Visit | Attending: Radiation Oncology | Admitting: Radiation Oncology

## 2023-02-13 DIAGNOSIS — Z191 Hormone sensitive malignancy status: Secondary | ICD-10-CM | POA: Diagnosis not present

## 2023-02-13 DIAGNOSIS — Z510 Encounter for antineoplastic radiation therapy: Secondary | ICD-10-CM | POA: Diagnosis not present

## 2023-02-13 DIAGNOSIS — C61 Malignant neoplasm of prostate: Secondary | ICD-10-CM | POA: Diagnosis not present

## 2023-02-13 LAB — RAD ONC ARIA SESSION SUMMARY
Course Elapsed Days: 17
Plan Fractions Treated to Date: 14
Plan Prescribed Dose Per Fraction: 1.8 Gy
Plan Total Fractions Prescribed: 25
Plan Total Prescribed Dose: 45 Gy
Reference Point Dosage Given to Date: 25.2 Gy
Reference Point Session Dosage Given: 1.8 Gy
Session Number: 14

## 2023-02-14 ENCOUNTER — Ambulatory Visit
Admission: RE | Admit: 2023-02-14 | Discharge: 2023-02-14 | Disposition: A | Discharge status: Home / Self Care | Payer: Medicare Other | Source: Ambulatory Visit | Attending: Radiation Oncology | Admitting: Radiation Oncology

## 2023-02-14 ENCOUNTER — Other Ambulatory Visit: Payer: Self-pay

## 2023-02-14 ENCOUNTER — Ambulatory Visit: Payer: Medicare Other

## 2023-02-14 DIAGNOSIS — C61 Malignant neoplasm of prostate: Secondary | ICD-10-CM | POA: Diagnosis not present

## 2023-02-14 DIAGNOSIS — Z510 Encounter for antineoplastic radiation therapy: Secondary | ICD-10-CM | POA: Diagnosis not present

## 2023-02-14 DIAGNOSIS — Z191 Hormone sensitive malignancy status: Secondary | ICD-10-CM | POA: Diagnosis not present

## 2023-02-14 LAB — RAD ONC ARIA SESSION SUMMARY
Course Elapsed Days: 18
Plan Fractions Treated to Date: 15
Plan Prescribed Dose Per Fraction: 1.8 Gy
Plan Total Fractions Prescribed: 25
Plan Total Prescribed Dose: 45 Gy
Reference Point Dosage Given to Date: 27 Gy
Reference Point Session Dosage Given: 1.8 Gy
Session Number: 15

## 2023-02-17 ENCOUNTER — Other Ambulatory Visit: Payer: Self-pay

## 2023-02-17 ENCOUNTER — Ambulatory Visit: Admission: RE | Admit: 2023-02-17 | Payer: Medicare Other | Source: Ambulatory Visit

## 2023-02-17 DIAGNOSIS — C61 Malignant neoplasm of prostate: Secondary | ICD-10-CM | POA: Diagnosis not present

## 2023-02-17 DIAGNOSIS — Z191 Hormone sensitive malignancy status: Secondary | ICD-10-CM | POA: Diagnosis not present

## 2023-02-17 DIAGNOSIS — Z510 Encounter for antineoplastic radiation therapy: Secondary | ICD-10-CM | POA: Diagnosis not present

## 2023-02-17 LAB — RAD ONC ARIA SESSION SUMMARY
Course Elapsed Days: 21
Plan Fractions Treated to Date: 16
Plan Prescribed Dose Per Fraction: 1.8 Gy
Plan Total Fractions Prescribed: 25
Plan Total Prescribed Dose: 45 Gy
Reference Point Dosage Given to Date: 28.8 Gy
Reference Point Session Dosage Given: 1.8 Gy
Session Number: 16

## 2023-02-18 ENCOUNTER — Other Ambulatory Visit: Payer: Self-pay

## 2023-02-18 ENCOUNTER — Ambulatory Visit: Admission: RE | Admit: 2023-02-18 | Payer: Medicare Other | Source: Ambulatory Visit

## 2023-02-18 DIAGNOSIS — Z191 Hormone sensitive malignancy status: Secondary | ICD-10-CM | POA: Diagnosis not present

## 2023-02-18 DIAGNOSIS — C61 Malignant neoplasm of prostate: Secondary | ICD-10-CM | POA: Diagnosis not present

## 2023-02-18 DIAGNOSIS — Z510 Encounter for antineoplastic radiation therapy: Secondary | ICD-10-CM | POA: Diagnosis not present

## 2023-02-18 LAB — RAD ONC ARIA SESSION SUMMARY
Course Elapsed Days: 22
Plan Fractions Treated to Date: 17
Plan Prescribed Dose Per Fraction: 1.8 Gy
Plan Total Fractions Prescribed: 25
Plan Total Prescribed Dose: 45 Gy
Reference Point Dosage Given to Date: 30.6 Gy
Reference Point Session Dosage Given: 1.8 Gy
Session Number: 17

## 2023-02-19 ENCOUNTER — Ambulatory Visit: Admission: RE | Admit: 2023-02-19 | Payer: Medicare Other | Source: Ambulatory Visit

## 2023-02-19 ENCOUNTER — Other Ambulatory Visit: Payer: Self-pay

## 2023-02-19 DIAGNOSIS — Z510 Encounter for antineoplastic radiation therapy: Secondary | ICD-10-CM | POA: Diagnosis not present

## 2023-02-19 DIAGNOSIS — Z191 Hormone sensitive malignancy status: Secondary | ICD-10-CM | POA: Diagnosis not present

## 2023-02-19 DIAGNOSIS — C61 Malignant neoplasm of prostate: Secondary | ICD-10-CM | POA: Diagnosis not present

## 2023-02-19 LAB — RAD ONC ARIA SESSION SUMMARY
Course Elapsed Days: 23
Plan Fractions Treated to Date: 18
Plan Prescribed Dose Per Fraction: 1.8 Gy
Plan Total Fractions Prescribed: 25
Plan Total Prescribed Dose: 45 Gy
Reference Point Dosage Given to Date: 32.4 Gy
Reference Point Session Dosage Given: 1.8 Gy
Session Number: 18

## 2023-02-20 ENCOUNTER — Other Ambulatory Visit: Payer: Self-pay

## 2023-02-20 ENCOUNTER — Ambulatory Visit
Admission: RE | Admit: 2023-02-20 | Discharge: 2023-02-20 | Disposition: A | Discharge status: Home / Self Care | Payer: Medicare Other | Source: Ambulatory Visit | Attending: Radiation Oncology | Admitting: Radiation Oncology

## 2023-02-20 DIAGNOSIS — C61 Malignant neoplasm of prostate: Secondary | ICD-10-CM | POA: Diagnosis not present

## 2023-02-20 DIAGNOSIS — Z191 Hormone sensitive malignancy status: Secondary | ICD-10-CM | POA: Diagnosis not present

## 2023-02-20 DIAGNOSIS — Z510 Encounter for antineoplastic radiation therapy: Secondary | ICD-10-CM | POA: Diagnosis not present

## 2023-02-20 LAB — RAD ONC ARIA SESSION SUMMARY
Course Elapsed Days: 24
Plan Fractions Treated to Date: 19
Plan Prescribed Dose Per Fraction: 1.8 Gy
Plan Total Fractions Prescribed: 25
Plan Total Prescribed Dose: 45 Gy
Reference Point Dosage Given to Date: 34.2 Gy
Reference Point Session Dosage Given: 1.8 Gy
Session Number: 19

## 2023-02-21 ENCOUNTER — Ambulatory Visit: Admission: RE | Admit: 2023-02-21 | Payer: Medicare Other | Source: Ambulatory Visit

## 2023-02-21 ENCOUNTER — Other Ambulatory Visit: Payer: Self-pay

## 2023-02-21 DIAGNOSIS — C61 Malignant neoplasm of prostate: Secondary | ICD-10-CM | POA: Diagnosis not present

## 2023-02-21 DIAGNOSIS — Z191 Hormone sensitive malignancy status: Secondary | ICD-10-CM | POA: Diagnosis not present

## 2023-02-21 DIAGNOSIS — Z510 Encounter for antineoplastic radiation therapy: Secondary | ICD-10-CM | POA: Diagnosis not present

## 2023-02-21 LAB — RAD ONC ARIA SESSION SUMMARY
Course Elapsed Days: 25
Plan Fractions Treated to Date: 20
Plan Prescribed Dose Per Fraction: 1.8 Gy
Plan Total Fractions Prescribed: 25
Plan Total Prescribed Dose: 45 Gy
Reference Point Dosage Given to Date: 36 Gy
Reference Point Session Dosage Given: 1.8 Gy
Session Number: 20

## 2023-02-24 ENCOUNTER — Other Ambulatory Visit: Payer: Self-pay

## 2023-02-24 ENCOUNTER — Ambulatory Visit: Admission: RE | Admit: 2023-02-24 | Payer: Medicare Other | Source: Ambulatory Visit

## 2023-02-24 DIAGNOSIS — Z191 Hormone sensitive malignancy status: Secondary | ICD-10-CM | POA: Diagnosis not present

## 2023-02-24 DIAGNOSIS — C61 Malignant neoplasm of prostate: Secondary | ICD-10-CM | POA: Diagnosis not present

## 2023-02-24 DIAGNOSIS — Z510 Encounter for antineoplastic radiation therapy: Secondary | ICD-10-CM | POA: Diagnosis not present

## 2023-02-24 LAB — RAD ONC ARIA SESSION SUMMARY
Course Elapsed Days: 28
Plan Fractions Treated to Date: 21
Plan Prescribed Dose Per Fraction: 1.8 Gy
Plan Total Fractions Prescribed: 25
Plan Total Prescribed Dose: 45 Gy
Reference Point Dosage Given to Date: 37.8 Gy
Reference Point Session Dosage Given: 1.8 Gy
Session Number: 21

## 2023-02-25 ENCOUNTER — Other Ambulatory Visit: Payer: Self-pay

## 2023-02-25 ENCOUNTER — Ambulatory Visit
Admission: RE | Admit: 2023-02-25 | Discharge: 2023-02-25 | Disposition: A | Discharge status: Home / Self Care | Payer: Medicare Other | Source: Ambulatory Visit | Attending: Radiation Oncology | Admitting: Radiation Oncology

## 2023-02-25 DIAGNOSIS — Z191 Hormone sensitive malignancy status: Secondary | ICD-10-CM | POA: Diagnosis not present

## 2023-02-25 DIAGNOSIS — R339 Retention of urine, unspecified: Secondary | ICD-10-CM | POA: Diagnosis not present

## 2023-02-25 DIAGNOSIS — Z510 Encounter for antineoplastic radiation therapy: Secondary | ICD-10-CM | POA: Diagnosis not present

## 2023-02-25 DIAGNOSIS — C61 Malignant neoplasm of prostate: Secondary | ICD-10-CM | POA: Diagnosis not present

## 2023-02-25 LAB — RAD ONC ARIA SESSION SUMMARY
Course Elapsed Days: 29
Plan Fractions Treated to Date: 22
Plan Prescribed Dose Per Fraction: 1.8 Gy
Plan Total Fractions Prescribed: 25
Plan Total Prescribed Dose: 45 Gy
Reference Point Dosage Given to Date: 39.6 Gy
Reference Point Session Dosage Given: 1.8 Gy
Session Number: 22

## 2023-02-26 ENCOUNTER — Ambulatory Visit
Admission: RE | Admit: 2023-02-26 | Discharge: 2023-02-26 | Disposition: A | Discharge status: Home / Self Care | Payer: Medicare Other | Source: Ambulatory Visit | Attending: Radiation Oncology | Admitting: Radiation Oncology

## 2023-02-26 ENCOUNTER — Other Ambulatory Visit: Payer: Self-pay

## 2023-02-26 DIAGNOSIS — Z510 Encounter for antineoplastic radiation therapy: Secondary | ICD-10-CM | POA: Diagnosis not present

## 2023-02-26 DIAGNOSIS — Z191 Hormone sensitive malignancy status: Secondary | ICD-10-CM | POA: Diagnosis not present

## 2023-02-26 DIAGNOSIS — C61 Malignant neoplasm of prostate: Secondary | ICD-10-CM | POA: Diagnosis not present

## 2023-02-26 LAB — RAD ONC ARIA SESSION SUMMARY
Course Elapsed Days: 30
Plan Fractions Treated to Date: 23
Plan Prescribed Dose Per Fraction: 1.8 Gy
Plan Total Fractions Prescribed: 25
Plan Total Prescribed Dose: 45 Gy
Reference Point Dosage Given to Date: 41.4 Gy
Reference Point Session Dosage Given: 1.8 Gy
Session Number: 23

## 2023-02-27 ENCOUNTER — Ambulatory Visit: Payer: Medicare Other

## 2023-02-28 ENCOUNTER — Other Ambulatory Visit: Payer: Self-pay

## 2023-02-28 ENCOUNTER — Ambulatory Visit: Payer: Medicare Other

## 2023-02-28 ENCOUNTER — Ambulatory Visit
Admission: RE | Admit: 2023-02-28 | Discharge: 2023-02-28 | Disposition: A | Discharge status: Home / Self Care | Payer: Medicare Other | Source: Ambulatory Visit | Attending: Radiation Oncology | Admitting: Radiation Oncology

## 2023-02-28 DIAGNOSIS — C61 Malignant neoplasm of prostate: Secondary | ICD-10-CM | POA: Diagnosis not present

## 2023-02-28 DIAGNOSIS — Z510 Encounter for antineoplastic radiation therapy: Secondary | ICD-10-CM | POA: Diagnosis not present

## 2023-02-28 DIAGNOSIS — Z191 Hormone sensitive malignancy status: Secondary | ICD-10-CM | POA: Diagnosis not present

## 2023-02-28 LAB — RAD ONC ARIA SESSION SUMMARY
Course Elapsed Days: 32
Plan Fractions Treated to Date: 24
Plan Prescribed Dose Per Fraction: 1.8 Gy
Plan Total Fractions Prescribed: 25
Plan Total Prescribed Dose: 45 Gy
Reference Point Dosage Given to Date: 43.2 Gy
Reference Point Session Dosage Given: 1.8 Gy
Session Number: 24

## 2023-03-03 ENCOUNTER — Ambulatory Visit
Admission: RE | Admit: 2023-03-03 | Discharge: 2023-03-03 | Disposition: A | Discharge status: Home / Self Care | Payer: Medicare Other | Source: Ambulatory Visit | Attending: Radiation Oncology | Admitting: Radiation Oncology

## 2023-03-03 ENCOUNTER — Other Ambulatory Visit: Payer: Self-pay

## 2023-03-03 DIAGNOSIS — Z510 Encounter for antineoplastic radiation therapy: Secondary | ICD-10-CM | POA: Diagnosis not present

## 2023-03-03 DIAGNOSIS — Z191 Hormone sensitive malignancy status: Secondary | ICD-10-CM | POA: Diagnosis not present

## 2023-03-03 DIAGNOSIS — C61 Malignant neoplasm of prostate: Secondary | ICD-10-CM

## 2023-03-03 LAB — RAD ONC ARIA SESSION SUMMARY
Course Elapsed Days: 35
Plan Fractions Treated to Date: 25
Plan Prescribed Dose Per Fraction: 1.8 Gy
Plan Total Fractions Prescribed: 25
Plan Total Prescribed Dose: 45 Gy
Reference Point Dosage Given to Date: 45 Gy
Reference Point Session Dosage Given: 1.8 Gy
Session Number: 25

## 2023-03-04 NOTE — Radiation Completion Notes (Addendum)
  Radiation Oncology         [phone] ________________________________  Name: Alan Brave, PhD MRN: [id number]  Date: 03/03/2023  DOB: 13-Apr-1946  Referring Physician: Marcine Matar, M.D. Date of Service: 2023-03-04 Radiation Oncologist: Margaretmary Bayley, M.D. [facility]     RADIATION ONCOLOGY END OF TREATMENT NOTE     Diagnosis: 77 y.o. gentleman with Stage T2 adenocarcinoma of the prostate with Gleason score of 4+5, and PSA of 14.9.   Intent: Curative     ==========DELIVERED PLANS==========  First Treatment Date: 2023-01-27 - Last Treatment Date: 2023-03-03   Plan Name: Prostate_Pelv Site: Prostate and solitary PET-avid node (the single PET-avid node was treated to 62.5 Gy in 25 fractions of 2.5 Gy using simultaneous integrated boost)  Technique: IMRT Mode: Photon Dose Per Fraction: 1.8 Gy Prescribed Dose (Delivered / Prescribed): 45 Gy / 45 Gy Prescribed Fxs (Delivered / Prescribed): 25 / 25   Plan Name: Prostate Seed Implant Site: Prostate Technique: Radioactive Seed Implant I-125 Mode: Brachytherapy Dose Per Fraction: 110 Gy Prescribed Dose (Delivered / Prescribed): 110 Gy / 110 Gy Prescribed Fxs (Delivered / Prescribed): 1 / 1     ==========ON TREATMENT VISIT DATES========== 2023-01-31, 2023-02-07, 2023-02-14, 2023-02-21, 2023-02-28     See weekly On Treatment Notes in Epic for details.  He tolerated the radiation treatments relatively well with increased LUTS (managed with Uroxatrol) and modest fatigue.  The patient will receive a call in about one month from the radiation oncology department. She will continue follow up with his urologist, Dr. Retta Diones as well.  ------------------------------------------------   Margaretmary Dys, MD [facility]  Radiation Oncology Direct Dial: [phone]  Fax: [phone] [email or web address]  Skype  LinkedIn

## 2023-03-07 NOTE — Progress Notes (Signed)
Patient was a RadOnc Consult on 10/07/22 for his stage T2 adenocarcinoma of the prostate with Gleason score of 4+5, and PSA of 14.9. Patient proceed with treatment recommendations of LT- ADT, brachy boost and daily radiation  and had his final radiation treatment on 03/03/23.   Patient is scheduled for a post treatment nurse call on 04/15/23 and had a follow up with [facility] with Dr. Berneice Heinrich in September to take over care since Dr. Lenoria Chime retirement.  RN provided education on PSA post treatment monitoring.  No additional needs at this time.

## 2023-03-28 DIAGNOSIS — R339 Retention of urine, unspecified: Secondary | ICD-10-CM | POA: Diagnosis not present

## 2023-04-01 DIAGNOSIS — K080 Exfoliation of teeth due to systemic causes: Secondary | ICD-10-CM | POA: Diagnosis not present

## 2023-04-03 ENCOUNTER — Encounter: Payer: Self-pay | Admitting: *Deleted

## 2023-04-03 ENCOUNTER — Other Ambulatory Visit: Payer: Self-pay | Admitting: Urology

## 2023-04-03 DIAGNOSIS — C61 Malignant neoplasm of prostate: Secondary | ICD-10-CM

## 2023-04-03 NOTE — Addendum Note (Signed)
Encounter addended by: Marcello Fennel, PA-C on: 04/03/2023 2:47 PM  Actions taken: Clinical Note Signed, Visit diagnoses modified

## 2023-04-15 ENCOUNTER — Ambulatory Visit
Admission: RE | Admit: 2023-04-15 | Discharge: 2023-04-15 | Disposition: A | Discharge status: Home / Self Care | Payer: Medicare Other | Source: Ambulatory Visit | Attending: Radiation Oncology | Admitting: Radiation Oncology

## 2023-04-15 DIAGNOSIS — C61 Malignant neoplasm of prostate: Secondary | ICD-10-CM | POA: Insufficient documentation

## 2023-04-15 NOTE — Progress Notes (Signed)
Radiation Oncology         [phone]) [phone] ________________________________  Name: Alan Brave, PhD MRN: [id number]  Date of Service: 04/15/2023  DOB: 02-23-1946  Post Treatment Telephone Note Diagnosis:  Stage T2 adenocarcinoma of the prostate with Gleason score of 4+5, and PSA of 14.9. (as documented in provider EOT note)  Pre Treatment IPSS Score: 14 (as documented in the provider consult note)  The patient was available for call today.   Symptoms of fatigue have not improved since completing therapy.  Symptoms of bladder changes have not improved since completing therapy. Current symptoms include polyuria, and medications for bladder symptoms include Alfuzosin.  Symptoms of bowel changes have improved since completing therapy. Current symptoms include none, and medications for bowel symptoms include none.   Post Treatment IPSS Score: IPSS Questionnaire (AUA-7): Patient is currently self-cath. (Producing 12-15oz urine 3x's in PM) Over the past month.   1)  How often have you had a sensation of not emptying your bladder completely after you finish urinating?  0 - Not at all  2)  How often have you had to urinate again less than two hours after you finished urinating? 0 - Not at all  3)  How often have you found you stopped and started again several times when you urinated?  0 - Not at all  4) How difficult have you found it to postpone urination?  0 - Not at all  5) How often have you had a weak urinary stream?  0 - Not at all  6) How often have you had to push or strain to begin urination?  0 - Not at all  7) How many times did you most typically get up to urinate from the time you went to bed until the time you got up in the morning?  3 - 3 times  Total score:  3. Which indicates mild symptoms  0-7 mildly symptomatic   8-19 moderately symptomatic   20-35 severely symptomatic    Patient has a scheduled follow up visit with his urologist, Dr. Berneice Heinrich, on 04/29/2023 for ongoing  surveillance. He was counseled that PSA levels will be drawn in the urology office, and was reassured that additional time is expected to improve bowel and bladder symptoms. He was encouraged to call back with concerns or questions regarding radiation.   This concludes the interaction.  Ruel Favors, LPN

## 2023-04-23 DIAGNOSIS — C61 Malignant neoplasm of prostate: Secondary | ICD-10-CM | POA: Diagnosis not present

## 2023-04-23 LAB — PSA: PSA: <0.0015

## 2023-04-28 ENCOUNTER — Inpatient Hospital Stay: Payer: Medicare Other | Attending: Adult Health | Admitting: *Deleted

## 2023-04-28 ENCOUNTER — Encounter: Payer: Self-pay | Admitting: *Deleted

## 2023-04-28 DIAGNOSIS — C61 Malignant neoplasm of prostate: Secondary | ICD-10-CM

## 2023-04-28 NOTE — Progress Notes (Signed)
SCP reviewed and completed. PT will see Dr. Berneice Heinrich on Oct.8 at [facility] to review PSA labs. Pt is still having to self -cath for voiding purposes. Last colonoscopy was 10/2015.

## 2023-04-29 DIAGNOSIS — C775 Secondary and unspecified malignant neoplasm of intrapelvic lymph nodes: Secondary | ICD-10-CM | POA: Diagnosis not present

## 2023-04-29 DIAGNOSIS — C61 Malignant neoplasm of prostate: Secondary | ICD-10-CM | POA: Diagnosis not present

## 2023-04-29 DIAGNOSIS — R338 Other retention of urine: Secondary | ICD-10-CM | POA: Diagnosis not present

## 2023-04-29 DIAGNOSIS — N5201 Erectile dysfunction due to arterial insufficiency: Secondary | ICD-10-CM | POA: Diagnosis not present

## 2023-05-13 DIAGNOSIS — R339 Retention of urine, unspecified: Secondary | ICD-10-CM | POA: Diagnosis not present

## 2023-05-14 ENCOUNTER — Encounter: Payer: Self-pay | Admitting: Family

## 2023-05-14 ENCOUNTER — Ambulatory Visit (INDEPENDENT_AMBULATORY_CARE_PROVIDER_SITE_OTHER): Payer: Medicare Other | Admitting: Family

## 2023-05-14 VITALS — BP 122/72 | HR 58 | Temp 98.4°F | Ht 78.0 in | Wt 208.8 lb

## 2023-05-14 DIAGNOSIS — M79674 Pain in right toe(s): Secondary | ICD-10-CM | POA: Diagnosis not present

## 2023-05-14 DIAGNOSIS — G5793 Unspecified mononeuropathy of bilateral lower limbs: Secondary | ICD-10-CM | POA: Diagnosis not present

## 2023-05-14 NOTE — Progress Notes (Signed)
Patient ID: Alan Brave, PhD, male    DOB: Mar 09, 1946, 77 y.o.   MRN: [id number]  Chief Complaint  Patient presents with   Ingrown Toenail    Pt c/o right big toe nail that's been ingrown for years.   *Discussed the use of AI scribe software for clinical note transcription with the patient, who gave verbal consent to proceed.  History of Present Illness   The patient, with a history of a previous toenail removal on his right toe, presents with chronic left toenail pain that has been present for approximately 30-40 years. The pain is described as deep and similar to an ingrown nail, particularly noticeable when pressure is applied or when the patient is active, such as when playing pickleball. The patient also reports discomfort when the toe comes into contact with sheets or blankets at night. The patient has been managing the pain by being careful with the toe and occasionally lifting the edge of the nail to alleviate pressure.  In addition to the toenail pain, the patient reports discomfort when walking barefoot, describing it as a sharp pain that affects the entire foot. This discomfort has been present for approximately 6-7 years and has been progressively worsening. The patient also reports a degree of numbness in the feet, with the sensation in the feet being noticeably different from the sensation in the hands. The patient suspects peripheral neuropathy and has been managing the discomfort by wearing sliders at all times.     Assessment & Plan:     Toenail pain -  Chronic discomfort, exacerbated by pressure from sheets and during physical activity. No current signs of infection. History of previous toenail removal. -Refer to podiatry for further evaluation and possible removal.  Peripheral Neuropathy - Reports of discomfort when walking barefoot. Both feet affected equally. No significant tingling or pins and needles sensation, but reports numbness, decreased sensation. -Refer to  podiatry for further evaluation and possible treatment options.      Subjective:    Outpatient Medications Prior to Visit  Medication Sig Dispense Refill   acyclovir (ZOVIRAX) 800 MG tablet TAKE 1 TABLET(800 MG) BY MOUTH TWICE DAILY (Patient taking differently: as needed.) 20 tablet 11   alfuzosin (UROXATRAL) 10 MG 24 hr tablet Take 10 mg by mouth every morning.     amLODipine (NORVASC) 5 MG tablet Take 1 tablet (5 mg total) by mouth daily. 90 tablet 3   arginine 500 MG tablet Take 1,200 mg by mouth daily.     cholecalciferol (VITAMIN D) 1000 units tablet Take 1,000 Units daily by mouth.     CINNAMON PO Take by mouth.     diazepam (VALIUM) 5 MG tablet Take 1 tablet (5 mg total) by mouth every 12 (twelve) hours as needed for muscle spasms. 30 tablet 1   relugolix (ORGOVYX) 120 MG tablet Take 120 mg by mouth every evening.     rosuvastatin (CRESTOR) 20 MG tablet TAKE 1 TABLET(20 MG) BY MOUTH DAILY (Patient taking differently: every evening.) 90 tablet 3   tadalafil (CIALIS) 5 MG tablet Take 1 tablet (5 mg total) by mouth every other day.     TURMERIC PO Take 2,000 mg by mouth daily.     No facility-administered medications prior to visit.   Past Medical History:  Diagnosis Date   Actinic keratosis 10/26/2008   Qualifier: Diagnosis of  By: Leveda Anna MD, Chrissie Noa     CERVICAL SPINE DISORDER, NOS 09/18/2006   Qualifier: Diagnosis of  By: Leveda Anna MD, Chrissie Noa  Diverticulitis    Diverticulitis of colon 09/18/2006   Pattern is one to two flairs per year of diverticulitis which has been easily managed by early augmentin treatment.     Elevated blood sugar 09/09/2014   Elevated PSA    ERECTILE DYSFUNCTION 10/26/2008   Qualifier: Diagnosis of  By: Leveda Anna MD, Chrissie Noa     Family history of hemochromatosis 09/09/2017   Father had symtomatic, lab proven hemochromatosis.  Likely needs genetic testing.     HYPERCHOLESTEROLEMIA 09/18/2006   Primary prevention: No known CAD, CVA, PVD or DM      Hyperlipidemia    Left ventricular diastolic dysfunction with preserved systolic function 05/16/2015   class I diastolic dysfunction by echo October 2016    Melanoma [facility])    Peripheral neuropathy 09/09/2014   toes   Prostate cancer (HCC)    Recurrent oral herpes simplex infection 09/09/2014   Seborrheic keratoses 08/27/2013   Thrombocytopenia (HCC) 07/26/2013   Has low normal platelet count and seems to have a qualitative platelet defect in that he bleeds very easily - previous normal work up.    Uric acid kidney stone 03/13/2015   Stone analysis done 03/2015 Uric acid stones occur in 10% of all kidney stones and are the second most-common cause of urinary stones after calcium oxalate and calcium phosphate calculi. The most important risk factor for uric acid crystallization and stone formation is a low urine pH (below 5.5) rather than an increased urinary uric acid excretion. Main causes of low urine pH are tubular disorders   Vitamin D deficiency disease 09/09/2014   Wears glasses    Wears hearing aid in both ears    Past Surgical History:  Procedure Laterality Date   basal cell area removed x 2     EYE SURGERY Left    eye hurt by shrub, surgery done   melanoma removed from left wrist     RADIOACTIVE SEED IMPLANT N/A 12/27/2022   Procedure: RADIOACTIVE SEED IMPLANT/BRACHYTHERAPY IMPLANT;  Surgeon: Marcine Matar, MD;  Location: [facility];  Service: Urology;  Laterality: N/A;  90 MINS   SPACE OAR INSTILLATION N/A 12/27/2022   Procedure: SPACE OAR INSTILLATION;  Surgeon: Marcine Matar, MD;  Location: [facility];  Service: Urology;  Laterality: N/A;   SPINE SURGERY     1990s L 4 to L 5   No Known Allergies    Objective:    Physical Exam Vitals and nursing note reviewed.  Constitutional:      General: He is not in acute distress.    Appearance: Normal appearance.  HENT:     Head: Normocephalic.  Cardiovascular:     Rate and Rhythm: Normal  rate and regular rhythm.  Pulmonary:     Effort: Pulmonary effort is normal.     Breath sounds: Normal breath sounds.  Musculoskeletal:        General: Normal range of motion.     Cervical back: Normal range of motion.  Skin:    General: Skin is warm and dry.     Comments: mild darkening noted on right great toe underneath toenail on left edge. no inflammation, erythema or swelling noted.  Neurological:     Mental Status: He is alert and oriented to person, place, and time.  Psychiatric:        Mood and Affect: Mood normal.    BP 122/72   Pulse (!) 58   Temp 98.4 F (36.9 C)   Ht 6\' 6"  (1.981  m)   Wt 208 lb 12.8 oz (94.7 kg)   SpO2 96%   BMI 24.13 kg/m  Wt Readings from Last 3 Encounters:  05/14/23 208 lb 12.8 oz (94.7 kg)  12/27/22 204 lb 12.8 oz (92.9 kg)  12/09/22 203 lb (92.1 kg)       Dulce Sellar, NP

## 2023-05-19 DIAGNOSIS — H25812 Combined forms of age-related cataract, left eye: Secondary | ICD-10-CM | POA: Diagnosis not present

## 2023-05-19 DIAGNOSIS — H43812 Vitreous degeneration, left eye: Secondary | ICD-10-CM | POA: Diagnosis not present

## 2023-05-19 DIAGNOSIS — H524 Presbyopia: Secondary | ICD-10-CM | POA: Diagnosis not present

## 2023-05-19 DIAGNOSIS — H43811 Vitreous degeneration, right eye: Secondary | ICD-10-CM | POA: Diagnosis not present

## 2023-05-19 DIAGNOSIS — H2511 Age-related nuclear cataract, right eye: Secondary | ICD-10-CM | POA: Diagnosis not present

## 2023-05-29 ENCOUNTER — Ambulatory Visit: Payer: Medicare Other | Admitting: Podiatry

## 2023-05-29 ENCOUNTER — Ambulatory Visit: Payer: Medicare Other

## 2023-05-29 ENCOUNTER — Encounter: Payer: Self-pay | Admitting: Podiatry

## 2023-05-29 DIAGNOSIS — L600 Ingrowing nail: Secondary | ICD-10-CM | POA: Diagnosis not present

## 2023-05-29 DIAGNOSIS — M7751 Other enthesopathy of right foot: Secondary | ICD-10-CM

## 2023-05-29 DIAGNOSIS — G609 Hereditary and idiopathic neuropathy, unspecified: Secondary | ICD-10-CM

## 2023-05-29 NOTE — Progress Notes (Signed)
No chief complaint on file.   HPI: 77 y.o. male presents today with primary complaint of chronic ingrowing toenail of the right foot.  Patient states that he had surgical treatment for this of the right great toe lateral border as a young adult.  Over the past year or so he has noticed this affecting the medial border now and sometimes noticing irritation of the left great toenail.  Denies any significant redness, drainage, swelling.  He does also report numbness and tingling to his feet, right worse than left.  He does have history of low back pain requiring surgery.  Past medical history notable for prostate cancer, treated with radiation therapy.  Past Medical History:  Diagnosis Date   Actinic keratosis 10/26/2008   Qualifier: Diagnosis of  By: Leveda Anna MD, Wen     CERVICAL SPINE DISORDER, NOS 09/18/2006   Qualifier: Diagnosis of  By: Leveda Anna MD, Virat     Diverticulitis    Diverticulitis of colon 09/18/2006   Pattern is one to two flairs per year of diverticulitis which has been easily managed by early augmentin treatment.     Elevated blood sugar 09/09/2014   Elevated PSA    ERECTILE DYSFUNCTION 10/26/2008   Qualifier: Diagnosis of  By: Leveda Anna MD, Chrissie Noa     Family history of hemochromatosis 09/09/2017   Father had symtomatic, lab proven hemochromatosis.  Likely needs genetic testing.     HYPERCHOLESTEROLEMIA 09/18/2006   Primary prevention: No known CAD, CVA, PVD or DM     Hyperlipidemia    Left ventricular diastolic dysfunction with preserved systolic function 05/16/2015   class I diastolic dysfunction by echo October 2016    Melanoma [facility])    Peripheral neuropathy 09/09/2014   toes   Prostate cancer (HCC)    Recurrent oral herpes simplex infection 09/09/2014   Seborrheic keratoses 08/27/2013   Thrombocytopenia (HCC) 07/26/2013   Has low normal platelet count and seems to have a qualitative platelet defect in that he bleeds very easily - previous normal work up.     Uric acid kidney stone 03/13/2015   Stone analysis done 03/2015 Uric acid stones occur in 10% of all kidney stones and are the second most-common cause of urinary stones after calcium oxalate and calcium phosphate calculi. The most important risk factor for uric acid crystallization and stone formation is a low urine pH (below 5.5) rather than an increased urinary uric acid excretion. Main causes of low urine pH are tubular disorders   Vitamin D deficiency disease 09/09/2014   Wears glasses    Wears hearing aid in both ears     Past Surgical History:  Procedure Laterality Date   basal cell area removed x 2     EYE SURGERY Left    eye hurt by shrub, surgery done   melanoma removed from left wrist     RADIOACTIVE SEED IMPLANT N/A 12/27/2022   Procedure: RADIOACTIVE SEED IMPLANT/BRACHYTHERAPY IMPLANT;  Surgeon: Marcine Matar, MD;  Location: [facility];  Service: Urology;  Laterality: N/A;  90 MINS   SPACE OAR INSTILLATION N/A 12/27/2022   Procedure: SPACE OAR INSTILLATION;  Surgeon: Marcine Matar, MD;  Location: [facility];  Service: Urology;  Laterality: N/A;   SPINE SURGERY     1990s L 4 to L 5    No Known Allergies  Review of Systems  Constitutional:  Positive for malaise/fatigue and weight loss. Negative for chills and fever.  HENT:  Negative for  congestion and sinus pain.   Eyes:  Negative for blurred vision and photophobia.  Respiratory:  Negative for cough and shortness of breath.   Cardiovascular:  Negative for chest pain, palpitations and leg swelling.  Gastrointestinal:  Negative for diarrhea, nausea and vomiting.  Musculoskeletal:  Negative for myalgias.  Neurological:  Negative for weakness.      Physical Exam: There were no vitals filed for this visit.  General: The patient is alert and oriented x3 in no acute distress.  Dermatology: Skin is warm, dry and supple bilateral lower extremities. Interspaces are clear of maceration  and debris.  Decreased plantar fat pad noted.  Tenderness on palpation of the right hallux nail medial border.  No associated redness, edema, drainage.  Some associated callus appreciated.  Vascular: Palpable pedal pulses bilaterally. Capillary refill within normal limits.  No appreciable edema.  No erythema or calor.  Neurological: Light touch sensation diminished bilaterally.   Musculoskeletal Exam: No symptomatic pedal deformities appreciated  Radiographic Exam:  Deferred  Assessment/Plan of Care: 1. Ingrown toenail of right foot   2. Idiopathic neuropathy      No orders of the defined types were placed in this encounter.  None  Discussed clinical findings with patient today.  #Ingrown toenail right great toe -Partial matrixectomy was offered to the patient today.  Patient elected to defer for now due to grandkids visiting tomorrow over the weekend and upcoming pickleball tournament -To feel that this is appropriate as this issue well chronic appears stable at this time.  Discussed Epsom salt soaks to alleviate any inflammation in the interim. -Right hallux medial nail corner palliatively trimmed. -Plan to have patient return for ingrown toenail removal in approximately 2 weeks  #Idiopathic neuropathy -Discussed possible underlying causes of neuropathy -Discussed possible treatments for neuropathy including medication.  After discussing risks and benefits, this will be deferred at this time as there is not significant pain associated with neuropathy currently  Follow up in approximately 2 weeks for Right hallux ingrown toenail removal with chemical matrixectomy.   Koren Sermersheim L. Marchia Bond, AACFAS [facility]     [address] [place], [place] [address]                Office [phone]  Fax [phone]

## 2023-06-11 DIAGNOSIS — R339 Retention of urine, unspecified: Secondary | ICD-10-CM | POA: Diagnosis not present

## 2023-06-12 ENCOUNTER — Ambulatory Visit: Payer: Medicare Other | Admitting: Podiatry

## 2023-06-12 ENCOUNTER — Encounter: Payer: Self-pay | Admitting: Podiatry

## 2023-06-12 DIAGNOSIS — L600 Ingrowing nail: Secondary | ICD-10-CM

## 2023-06-12 NOTE — Patient Instructions (Signed)

## 2023-06-12 NOTE — Progress Notes (Signed)
Chief Complaint  Patient presents with   Ingrown Toenail    Rt ingrown big toe    HPI: 77 y.o. male presents today for follow-up evaluation of right ingrown toenail.  He states he is rather procedure at this time.  He states that the nail did stay tender despite having the corner trimmed back last time.  He denies any nausea, vomiting, fever, chills, chest pain, shortness of breath.  Past Medical History:  Diagnosis Date   Actinic keratosis 10/26/2008   Qualifier: Diagnosis of  By: Leveda Anna MD, Adarsh     CERVICAL SPINE DISORDER, NOS 09/18/2006   Qualifier: Diagnosis of  By: Leveda Anna MD, Anil     Diverticulitis    Diverticulitis of colon 09/18/2006   Pattern is one to two flairs per year of diverticulitis which has been easily managed by early augmentin treatment.     Elevated blood sugar 09/09/2014   Elevated PSA    ERECTILE DYSFUNCTION 10/26/2008   Qualifier: Diagnosis of  By: Leveda Anna MD, Chrissie Noa     Family history of hemochromatosis 09/09/2017   Father had symtomatic, lab proven hemochromatosis.  Likely needs genetic testing.     HYPERCHOLESTEROLEMIA 09/18/2006   Primary prevention: No known CAD, CVA, PVD or DM     Hyperlipidemia    Left ventricular diastolic dysfunction with preserved systolic function 05/16/2015   class I diastolic dysfunction by echo October 2016    Melanoma [facility])    Peripheral neuropathy 09/09/2014   toes   Prostate cancer (HCC)    Recurrent oral herpes simplex infection 09/09/2014   Seborrheic keratoses 08/27/2013   Thrombocytopenia (HCC) 07/26/2013   Has low normal platelet count and seems to have a qualitative platelet defect in that he bleeds very easily - previous normal work up.    Uric acid kidney stone 03/13/2015   Stone analysis done 03/2015 Uric acid stones occur in 10% of all kidney stones and are the second most-common cause of urinary stones after calcium oxalate and calcium phosphate calculi. The most important risk factor for uric  acid crystallization and stone formation is a low urine pH (below 5.5) rather than an increased urinary uric acid excretion. Main causes of low urine pH are tubular disorders   Vitamin D deficiency disease 09/09/2014   Wears glasses    Wears hearing aid in both ears     Past Surgical History:  Procedure Laterality Date   basal cell area removed x 2     EYE SURGERY Left    eye hurt by shrub, surgery done   melanoma removed from left wrist     RADIOACTIVE SEED IMPLANT N/A 12/27/2022   Procedure: RADIOACTIVE SEED IMPLANT/BRACHYTHERAPY IMPLANT;  Surgeon: Marcine Matar, MD;  Location[facility];  Service: Urology;  Laterality: N/A;  90 MINS   SPACE OAR INSTILLATION N/A 12/27/2022   Procedure: SPACE OAR INSTILLATION;  Surgeon: Marcine Matar, MD;  Location: [facility];  Service: Urology;  Laterality: N/A;   SPINE SURGERY     1990s L 4 to L 5    No Known Allergies  ROS    Physical Exam: There were no vitals filed for this visit.  General: The patient is alert and oriented x3 in no acute distress.  Dermatology: Right hallux first toenail medial border is tender on palpation, minimal local edema, no significant erythema, no drainage.  Vascular: Palpable pedal pulses bilaterally. Capillary refill within normal limits.  No appreciable edema.  No  erythema or calor.  Neurological: Light touch sensation diminished to bilateral feet.   Musculoskeletal Exam: No pedal deformities noted  Assessment/Plan of Care: 1. Ingrown toenail of right foot      No orders of the defined types were placed in this encounter.  None  Discussed clinical findings with patient today.  Plan: -Verbal and written consent obtained to perform nail avulsion of the right hallux medial border with chemical matrixectomy after discussion of risk, benefits and alternative therapies. -Right hallux anesthetized injecting a total of 3 cc of a one-to-one ratio of 2% lidocaine plain  0.5% Marcaine plain -Right hallux was prepped with Betadine.  Behravan tourniquet was applied. -Nail avulsion of the right hallux medial border was performed, no underlying purulence was appreciated.  No remaining spicules appreciated -Chemical matrixectomy was performed using 3 x 8-second applications of 10% sodium hydroxide followed by vinegar lavage -Silvadene bandage applied. -Soaking, wound care, aftercare instructions discussed at length with patient.  Written instructions dispensed.  May follow-up in 2 weeks for nail check.   Gurnie Duris L. Marchia Bond, AACFAS [facility]     [address] [place], [place] [address]                Office [phone]  Fax [phone]

## 2023-06-15 ENCOUNTER — Other Ambulatory Visit: Payer: Self-pay | Admitting: Medical Genetics

## 2023-06-15 DIAGNOSIS — Z006 Encounter for examination for normal comparison and control in clinical research program: Secondary | ICD-10-CM

## 2023-06-26 ENCOUNTER — Ambulatory Visit: Payer: Medicare Other | Admitting: Podiatry

## 2023-06-26 ENCOUNTER — Encounter: Payer: Self-pay | Admitting: Podiatry

## 2023-06-26 DIAGNOSIS — M205X1 Other deformities of toe(s) (acquired), right foot: Secondary | ICD-10-CM

## 2023-06-26 DIAGNOSIS — L600 Ingrowing nail: Secondary | ICD-10-CM

## 2023-06-26 NOTE — Progress Notes (Signed)
Chief Complaint  Patient presents with   Foot Pain   Routine Post Op    Doing well post -op.  Soaking twice a day, changing bandage    HPI: 77 y.o. male presents today for follow-up evaluation of right ingrown toenail.  Underwent partial nail avulsion of right hallux medial border with chemical matrixectomy on 06/12/2023.  Doing well.  Denies any pain.  Has been keeping up with soaking instructions.  He does mention that he has had some right great toe stiffness and soreness to the plantar aspect however this is very mild and does not affect his activity at this point.  He denies any nausea, vomiting, fever, chills, chest pain, shortness of breath.  Past Medical History:  Diagnosis Date   Actinic keratosis 10/26/2008   Qualifier: Diagnosis of  By: Leveda Anna MD, Krystal     CERVICAL SPINE DISORDER, NOS 09/18/2006   Qualifier: Diagnosis of  By: Leveda Anna MD, Lenorris     Diverticulitis    Diverticulitis of colon 09/18/2006   Pattern is one to two flairs per year of diverticulitis which has been easily managed by early augmentin treatment.     Elevated blood sugar 09/09/2014   Elevated PSA    ERECTILE DYSFUNCTION 10/26/2008   Qualifier: Diagnosis of  By: Leveda Anna MD, Chrissie Noa     Family history of hemochromatosis 09/09/2017   Father had symtomatic, lab proven hemochromatosis.  Likely needs genetic testing.     HYPERCHOLESTEROLEMIA 09/18/2006   Primary prevention: No known CAD, CVA, PVD or DM     Hyperlipidemia    Left ventricular diastolic dysfunction with preserved systolic function 05/16/2015   class I diastolic dysfunction by echo October 2016    Mel[facility])    Peripheral neuropathy 09/09/2014   toes   Prostate cancer (HCC)    Recurrent oral herpes simplex infection 09/09/2014   Seborrheic keratoses 08/27/2013   Thrombocytopenia (HCC) 07/26/2013   Has low normal platelet count and seems to have a qualitative platelet defect in that he bleeds very easily - previous normal work  up.    Uric acid kidney stone 03/13/2015   Stone analysis done 03/2015 Uric acid stones occur in 10% of all kidney stones and are the second most-common cause of urinary stones after calcium oxalate and calcium phosphate calculi. The most important risk factor for uric acid crystallization and stone formation is a low urine pH (below 5.5) rather than an increased urinary uric acid excretion. Main causes of low urine pH are tubular disorders   Vitamin D deficiency disease 09/09/2014   Wears glasses    Wears hearing aid in both ears     Past Surgical History:  Procedure Laterality Date   basal cell area removed x 2     EYE SURGERY Left    eye hurt by shrub, surgery done   melanoma removed from left wrist     RADIOACTIVE SEED IMPLANT N/A 12/27/2022   Procedure: RADIOACTIVE SEED IMPLANT/BRACHYTHERAPY IMPLANT;  Surgeon: Marcine Matar, MD;  Location: [facility];  Service: Urology;  Laterality: N/A;  90 MINS   SPACE OAR INSTILLATION N/A 12/27/2022   Procedure: SPACE OAR INSTILLATION;  Surgeon: Marcine Matar, MD;  Location: [facility];  Service: Urology;  Laterality: N/A;   SPINE SURGERY     1990s L 4 to L 5    No Known Allergies  ROS    Physical Exam: There were no vitals filed for this visit.  General:  The patient is alert and oriented x3 in no acute distress.  Dermatology: Right hallux first toenail medial border site is healing well with scab formation to the medial border.  No erythema or edema present.  No tenderness on palpation.  Vascular: Palpable pedal pulses bilaterally. Capillary refill within normal limits.  No appreciable edema.  No erythema or calor.  Neurological: Light touch sensation diminished to bilateral feet.   Musculoskeletal Exam: Decreased right first MPJ dorsiflexion noted, this is nonpainful.  Mild tenderness on palpation of plantar aspect of subfirst metatarsal head.  Assessment/Plan of Care: 1. Ingrown toenail of right  foot   2. Acquired hallux limitus of right foot      No orders of the defined types were placed in this encounter.  None  Discussed clinical findings with patient today.  Plan: - Right hallux PNA site healing well - Discontinue topical antibacterial ointment - Apply Band-Aid as needed if experiencing drainage - May continue foot soaks for a couple days as needed otherwise may continue with regular activity as tolerated -Hallux limitus symptoms currently are quite mild according to the patient, we will monitor this for the future.  Otherwise recommend use of good stiff soled shoes.  Dancer's felt offloading pad dispensed and applied for the patient - Follow-up as needed.   Emerson Schreifels L. Marchia Bond, AACFAS [facility]     [address] [place], [place] [address]                Office [phone]  Fax [phone]   Discussed stiff soled shoes for right hallux limitus R hallux medial PNA site doing great

## 2023-07-09 DIAGNOSIS — R339 Retention of urine, unspecified: Secondary | ICD-10-CM | POA: Diagnosis not present

## 2023-07-21 ENCOUNTER — Other Ambulatory Visit (HOSPITAL_COMMUNITY)
Admission: RE | Admit: 2023-07-21 | Discharge: 2023-07-21 | Disposition: A | Discharge status: Home / Self Care | Payer: Self-pay | Source: Ambulatory Visit | Attending: Oncology | Admitting: Oncology

## 2023-07-21 DIAGNOSIS — Z006 Encounter for examination for normal comparison and control in clinical research program: Secondary | ICD-10-CM | POA: Insufficient documentation

## 2023-07-23 ENCOUNTER — Other Ambulatory Visit: Payer: Self-pay | Admitting: Family Medicine

## 2023-07-31 ENCOUNTER — Other Ambulatory Visit (HOSPITAL_COMMUNITY): Payer: Medicare Other

## 2023-08-04 LAB — GENECONNECT MOLECULAR SCREEN: Genetic Analysis Overall Interpretation: NEGATIVE

## 2023-08-08 DIAGNOSIS — R339 Retention of urine, unspecified: Secondary | ICD-10-CM | POA: Diagnosis not present

## 2023-09-03 DIAGNOSIS — N401 Enlarged prostate with lower urinary tract symptoms: Secondary | ICD-10-CM | POA: Diagnosis not present

## 2023-09-03 DIAGNOSIS — R338 Other retention of urine: Secondary | ICD-10-CM | POA: Diagnosis not present

## 2023-09-08 DIAGNOSIS — R339 Retention of urine, unspecified: Secondary | ICD-10-CM | POA: Diagnosis not present

## 2023-10-06 DIAGNOSIS — R339 Retention of urine, unspecified: Secondary | ICD-10-CM | POA: Diagnosis not present

## 2023-10-12 ENCOUNTER — Other Ambulatory Visit: Payer: Self-pay | Admitting: Family Medicine

## 2023-10-12 DIAGNOSIS — I10 Essential (primary) hypertension: Secondary | ICD-10-CM

## 2023-10-20 DIAGNOSIS — N3000 Acute cystitis without hematuria: Secondary | ICD-10-CM | POA: Diagnosis not present

## 2023-10-20 DIAGNOSIS — R8271 Bacteriuria: Secondary | ICD-10-CM | POA: Diagnosis not present

## 2023-10-20 DIAGNOSIS — R338 Other retention of urine: Secondary | ICD-10-CM | POA: Diagnosis not present

## 2023-10-21 ENCOUNTER — Encounter: Payer: Self-pay | Admitting: Family Medicine

## 2023-10-28 ENCOUNTER — Ambulatory Visit (INDEPENDENT_AMBULATORY_CARE_PROVIDER_SITE_OTHER): Payer: Medicare Other | Admitting: Family Medicine

## 2023-10-28 VITALS — BP 136/70 | HR 101 | Temp 97.7°F | Ht 78.0 in | Wt 209.8 lb

## 2023-10-28 DIAGNOSIS — Z0001 Encounter for general adult medical examination with abnormal findings: Secondary | ICD-10-CM | POA: Diagnosis not present

## 2023-10-28 DIAGNOSIS — K5732 Diverticulitis of large intestine without perforation or abscess without bleeding: Secondary | ICD-10-CM

## 2023-10-28 DIAGNOSIS — E785 Hyperlipidemia, unspecified: Secondary | ICD-10-CM | POA: Diagnosis not present

## 2023-10-28 DIAGNOSIS — E559 Vitamin D deficiency, unspecified: Secondary | ICD-10-CM

## 2023-10-28 DIAGNOSIS — E349 Endocrine disorder, unspecified: Secondary | ICD-10-CM

## 2023-10-28 DIAGNOSIS — R739 Hyperglycemia, unspecified: Secondary | ICD-10-CM

## 2023-10-28 DIAGNOSIS — I10 Essential (primary) hypertension: Secondary | ICD-10-CM

## 2023-10-28 DIAGNOSIS — D696 Thrombocytopenia, unspecified: Secondary | ICD-10-CM

## 2023-10-28 DIAGNOSIS — C61 Malignant neoplasm of prostate: Secondary | ICD-10-CM | POA: Diagnosis not present

## 2023-10-28 LAB — COMPREHENSIVE METABOLIC PANEL WITH GFR
ALT: 19 U/L (ref 0–53)
AST: 21 U/L (ref 0–37)
Albumin: 4.4 g/dL (ref 3.5–5.2)
Alkaline Phosphatase: 95 U/L (ref 39–117)
BUN: 24 mg/dL — ABNORMAL HIGH (ref 6–23)
CO2: 27 meq/L (ref 19–32)
Calcium: 9.6 mg/dL (ref 8.4–10.5)
Chloride: 106 meq/L (ref 96–112)
Creatinine, Ser: 1.09 mg/dL (ref 0.40–1.50)
GFR: 65.82 mL/min (ref >60.00)
Glucose, Bld: 109 mg/dL — ABNORMAL HIGH (ref 70–99)
Potassium: 4.2 meq/L (ref 3.5–5.1)
Sodium: 141 meq/L (ref 135–145)
Total Bilirubin: 0.4 mg/dL (ref 0.2–1.2)
Total Protein: 7.2 g/dL (ref 6.0–8.3)

## 2023-10-28 LAB — CBC
HCT: 41 % (ref 39.0–52.0)
Hemoglobin: 13.6 g/dL (ref 13.0–17.0)
MCHC: 33.1 g/dL (ref 30.0–36.0)
MCV: 95.5 fl (ref 78.0–100.0)
Platelets: 167 10*3/uL (ref 150.0–400.0)
RBC: 4.29 Mil/uL (ref 4.22–5.81)
RDW: 13.5 % (ref 11.5–15.5)
WBC: 4.1 10*3/uL (ref 4.0–10.5)

## 2023-10-28 LAB — LIPID PANEL
Cholesterol: 167 mg/dL (ref 0–200)
HDL: 63.3 mg/dL (ref >39.00)
LDL Cholesterol: 89 mg/dL (ref 0–99)
NonHDL: 104.03
Total CHOL/HDL Ratio: 3
Triglycerides: 76 mg/dL (ref 0.0–149.0)
VLDL: 15.2 mg/dL (ref 0.0–40.0)

## 2023-10-28 LAB — VITAMIN D 25 HYDROXY (VIT D DEFICIENCY, FRACTURES): VITD: 47.31 ng/mL (ref 30.00–100.00)

## 2023-10-28 LAB — HEMOGLOBIN A1C: Hgb A1c MFr Bld: 6.3 % (ref 4.6–6.5)

## 2023-10-28 LAB — TSH: TSH: 2.53 u[IU]/mL (ref 0.35–5.50)

## 2023-10-28 MED ORDER — AMOXICILLIN-POT CLAVULANATE 875-125 MG PO TABS: 1.0000 | ORAL_TABLET | Freq: Two times a day (BID) | ORAL | 3 refills | Status: AC

## 2023-10-28 NOTE — Assessment & Plan Note (Signed)
 At goal today on amlodipine 5 mg daily.  At goal at home as well.

## 2023-10-28 NOTE — Assessment & Plan Note (Signed)
 On Crestor 10 mg daily.  Doing well.  Check lipids.

## 2023-10-28 NOTE — Assessment & Plan Note (Signed)
 Check A1c.

## 2023-10-28 NOTE — Assessment & Plan Note (Signed)
 Check CBC

## 2023-10-28 NOTE — Progress Notes (Signed)
 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    Assessment/Plan:  Chronic Problems Addressed Today: Prostate cancer [facility]) And lengthy discussion with patient today regarding his prostate cancer and management.  He is currently following with oncology and urology for this.  He has went through radiation and is now currently on antitestosterone treatment with Orgovyx.  He will be following up with urology next week.  Currently on Keflex for urinary tract infection due to intermittent catheterization.  We will check DEXA to screen for osteoporosis given that he has been on antitestosterone therapy for the last year.  Dyslipidemia On Crestor 10 mg daily.  Doing well.  Check lipids.  Thrombocytopenia (HCC) Check CBC.   Vitamin D deficiency disease Check vitamin D.  He has cut back on vitamin D supplementation due to concern for oversupplementation.  Hypertension, essential At goal today on amlodipine 5 mg daily.  At goal at home as well.  Elevated blood sugar Check A1c.  Diverticulitis of colon Uses Augmentin as needed for flares.  Will refill pocket prescription today.  Typically tolerates well.  No recent flares.   Preventative Healthcare: Check labs.  Up-to-date on vaccines.  Patient Counseling(The following topics were reviewed and/or handout was given):  -Nutrition: Stressed importance of moderation in sodium/caffeine intake, saturated fat and cholesterol, caloric balance, sufficient intake of fresh fruits, vegetables, and fiber.  -Stressed the importance of regular exercise.   -Substance Abuse: Discussed cessation/primary prevention of tobacco, alcohol, or other drug use; driving or other dangerous activities under the influence; availability of treatment for abuse.   -Injury prevention: Discussed safety belts, safety helmets, smoke detector, smoking near bedding or upholstery.   -Sexuality:  Discussed sexually transmitted diseases, partner selection, use of condoms, avoidance of unintended pregnancy and contraceptive alternatives.   -Dental health: Discussed importance of regular tooth brushing, flossing, and dental visits.  -Health maintenance and immunizations reviewed. Please refer to Health maintenance section.  Return to care in 1 year for next preventative visit.     Subjective:  HPI:  He has no acute complaints today. Since our last visit he has been following with urology and oncology for prostate cancer. He was started on radiation therapy and anti androgen therapy. He is having some side effects with this including difficulty with urination and hot flashes. He is now intermittent cathing.   Lifestyle Diet: Balanced. Plenty of fruits and vegetables.  Exercise: Playing pickleball and going to gym multiple times per week.      05/14/2023   11:05 AM  Depression screen PHQ 2/9  Decreased Interest 0  Down, Depressed, Hopeless 0  PHQ - 2 Score 0  Altered sleeping 0  Tired, decreased energy 1  Change in appetite 0  Feeling bad or failure about yourself  0  Trouble concentrating 0  Moving slowly or fidgety/restless 0  Suicidal thoughts 0  PHQ-9 Score 1  Difficult doing work/chores Not difficult at all    Health Maintenance Due  Topic Date Due   COVID-19 Vaccine (7 - 2024-25 season) 07/08/2023   LIPID PANEL  10/22/2023   Medicare Annual Wellness (AWV)  12/09/2023     ROS: Per HPI, otherwise a complete review of systems was negative.   PMH:  The following were reviewed and entered/updated in epic: Past Medical History:  Diagnosis Date   Actinic keratosis 10/26/2008   Qualifier: Diagnosis of  By: Leveda Anna MD, Chrissie Noa     CERVICAL  SPINE DISORDER, NOS 09/18/2006   Qualifier: Diagnosis of  By: Leveda Anna MD, Deejay     Diverticulitis    Diverticulitis of colon 09/18/2006   Pattern is one to two flairs per year of diverticulitis which has been easily managed by  early augmentin treatment.     Elevated blood sugar 09/09/2014   Elevated PSA    ERECTILE DYSFUNCTION 10/26/2008   Qualifier: Diagnosis of  By: Leveda Anna MD, Chrissie Noa     Family history of hemochromatosis 09/09/2017   Father had symtomatic, lab proven hemochromatosis.  Likely needs genetic testing.     HYPERCHOLESTEROLEMIA 09/18/2006   Primary prevention: No known CAD, CVA, PVD or DM     Hyperlipidemia    Left ventricular diastolic dysfunction with preserved systolic function 05/16/2015   class I diastolic dysfunction by echo October 2016    Melanoma [facility])    Peripheral neuropathy 09/09/2014   toes   Prostate cancer (HCC)    Recurrent oral herpes simplex infection 09/09/2014   Seborrheic keratoses 08/27/2013   Thrombocytopenia (HCC) 07/26/2013   Has low normal platelet count and seems to have a qualitative platelet defect in that he bleeds very easily - previous normal work up.    Uric acid kidney stone 03/13/2015   Stone analysis done 03/2015 Uric acid stones occur in 10% of all kidney stones and are the second most-common cause of urinary stones after calcium oxalate and calcium phosphate calculi. The most important risk factor for uric acid crystallization and stone formation is a low urine pH (below 5.5) rather than an increased urinary uric acid excretion. Main causes of low urine pH are tubular disorders   Vitamin D deficiency disease 09/09/2014   Wears glasses    Wears hearing aid in both ears    Patient Active Problem List   Diagnosis Date Noted   Prostate cancer (HCC) 08/28/2022   Bradycardia 10/02/2021   Decreased hearing of both ears 04/16/2021   Family history of hemochromatosis 09/09/2017   Left ventricular diastolic dysfunction with preserved systolic function 05/16/2015   Hypertension, essential 05/16/2015   Uric acid kidney stone 03/13/2015   Cold sore 09/09/2014   Peripheral neuropathy 09/09/2014   Vitamin D deficiency disease 09/09/2014   Elevated blood sugar  09/09/2014   Seborrheic keratoses 08/27/2013   Thrombocytopenia (HCC) 07/26/2013   ERECTILE DYSFUNCTION 10/26/2008   ACTINIC KERATOSIS 10/26/2008   Dyslipidemia 09/18/2006   Diverticulitis of colon 09/18/2006   Musculoskeletal disorder and symptoms referable to neck 09/18/2006   Chronic bilateral low back pain without sciatica 09/18/2006   Past Surgical History:  Procedure Laterality Date   basal cell area removed x 2     EYE SURGERY Left    eye hurt by shrub, surgery done   melanoma removed from left wrist     RADIOACTIVE SEED IMPLANT N/A 12/27/2022   Procedure: RADIOACTIVE SEED IMPLANT/BRACHYTHERAPY IMPLANT;  Surgeon: Marcine Matar, MD;  Location: [facility];  Service: Urology;  Laterality: N/A;  90 MINS   SPACE OAR INSTILLATION N/A 12/27/2022   Procedure: SPACE OAR INSTILLATION;  Surgeon: Marcine Matar, MD;  Location: [facility];  Service: Urology;  Laterality: N/A;   SPINE SURGERY     1990s L 4 to L 5    Family History  Problem Relation Age of Onset   Cancer Mother        Vulvar   COPD Father    Cancer Sister        breast   Heart disease  Sister    Heart disease Brother    Thyroid disease Sister     Medications- reviewed and updated Current Outpatient Medications  Medication Sig Dispense Refill   acyclovir (ZOVIRAX) 800 MG tablet TAKE 1 TABLET(800 MG) BY MOUTH TWICE DAILY (Patient taking differently: as needed.) 20 tablet 11   alfuzosin (UROXATRAL) 10 MG 24 hr tablet Take 10 mg by mouth every morning.     amLODipine (NORVASC) 5 MG tablet TAKE 1 TABLET BY MOUTH EVERY DAY 90 tablet 0   amoxicillin-clavulanate (AUGMENTIN) 875-125 MG tablet Take 1 tablet by mouth 2 (two) times daily. As needed for diverticulitis 20 tablet 3   arginine 500 MG tablet Take 1,200 mg by mouth daily.     Calcium Carbonate-Vitamin D (CALCIUM-VITAMIN D PO) Take by mouth.     cholecalciferol (VITAMIN D) 1000 units tablet Take 1,000 Units daily by mouth.      CINNAMON PO Take by mouth.     diazepam (VALIUM) 5 MG tablet Take 1 tablet (5 mg total) by mouth every 12 (twelve) hours as needed for muscle spasms. 30 tablet 1   MAGNESIUM CITRATE PO Take 100 mg by mouth daily.     relugolix (ORGOVYX) 120 MG tablet Take 120 mg by mouth every evening.     rosuvastatin (CRESTOR) 20 MG tablet TAKE 1 TABLET(20 MG) BY MOUTH DAILY 90 tablet 3   tadalafil (CIALIS) 5 MG tablet Take 1 tablet (5 mg total) by mouth every other day.     TURMERIC PO Take 2,000 mg by mouth daily.     No current facility-administered medications for this visit.    Allergies-reviewed and updated No Known Allergies  Social History   Socioeconomic History   Marital status: Married    Spouse name: Johnny Bridge   Number of children: 3   Years of education: PHD   Highest education level: Not on file  Occupational History   Occupation: PHD-physcologist     Employer: [employer]  Tobacco Use   Smoking status: Never   Smokeless tobacco: Never  Vaping Use   Vaping status: Never Used  Substance and Sexual Activity   Alcohol use: Yes    Comment: 1 glass wine per week   Drug use: No   Sexual activity: Yes    Comment: monagomous  Other Topics Concern   Not on file  Social History Narrative   Emergency Contact: wife, Johnny Bridge, [phone]   Diet: Pt has a varied diet of protein, starch, and vegetables/fruits   Seatbelts: Pt reports wearing seatbelt when in vehicles.    Sun Exposure/Protection: Pt reports wearing ball cap, long sleeve   Hobbies: golf, exercise, outdoors, reading       Patient lives with wife Johnny Bridge) in two level home 03/23/2020   Transportation: Patient has own vehicle and drives himself 03/23/2020   Important Relationships Spouse, kids, grandkids, friends  03/23/2020   Pets: None 03/23/2020   Education / Work:  PhD/ Psychologist 03/23/2020   Interests / Fun: Golf, travel, keep busy, pickle ball 04/19/2021   Current Stressors: Minor life hassles 03/23/2020    Religious / Personal Beliefs: Catholic 03/23/2020  Social Drivers of Corporate investment banker Strain: Low Risk  (12/05/2022)   Overall Financial Resource Strain (CARDIA)    Difficulty of Paying Living Expenses: Not hard at all  Food Insecurity: No Food Insecurity (12/05/2022)   Hunger Vital Sign    Worried About Running Out of Food in the Last Year: Never true    Ran Out of Food in the Last Year: Never true  Transportation Needs: Unknown (12/05/2022)   PRAPARE - Administrator, Civil Service (Medical): No    Lack of Transportation (Non-Medical): Not on file  Physical Activity: Sufficiently Active (12/05/2022)   Exercise Vital Sign    Days of Exercise per Week: 7 days    Minutes of Exercise per Session: 150+ min  Stress: No Stress Concern Present (12/05/2022)   [employer] of Occupational Health - Occupational Stress Questionnaire    Feeling of Stress : Not at all  Social Connections: Moderately Isolated (12/05/2022)   Social Connection and Isolation Panel [NHANES]    Frequency of Communication with Friends and Family: More than three times a week    Frequency of Social Gatherings with Friends and Family: More than three times a week    Attends Religious Services: Never    Database administrator or Organizations: No    Attends Engineer, structural: Never    Marital Status: Married        Objective:  Physical Exam: BP 136/70   Pulse (!) 101   Temp 97.7 F (36.5 C) (Temporal)   Ht 6\' 6"  (1.981 m)   Wt 209 lb 12.8 oz (95.2 kg)   SpO2 97%   BMI 24.24 kg/m   Body mass index is 24.24 kg/m. Wt Readings from Last 3 Encounters:  10/28/23 209 lb 12.8 oz (95.2 kg)  05/14/23 208 lb 12.8 oz (94.7 kg)  12/27/22 204 lb 12.8 oz (92.9 kg)  Gen: NAD, resting comfortably HEENT: TMs normal bilaterally. OP clear. No thyromegaly noted.  CV: RRR with no murmurs  appreciated Pulm: NWOB, CTAB with no crackles, wheezes, or rhonchi GI: Normal bowel sounds present. Soft, Nontender, Nondistended. MSK: no edema, cyanosis, or clubbing noted Skin: warm, dry Neuro: CN2-12 grossly intact. Strength 5/5 in upper and lower extremities. Reflexes symmetric and intact bilaterally.  Psych: Normal affect and thought content     Lundynn Cohoon M. Jimmey Ralph, MD 10/28/2023 8:40 AM

## 2023-10-28 NOTE — Assessment & Plan Note (Signed)
 Uses Augmentin as needed for flares.  Will refill pocket prescription today.  Typically tolerates well.  No recent flares.

## 2023-10-28 NOTE — Assessment & Plan Note (Signed)
 Check vitamin D.  He has cut back on vitamin D supplementation due to concern for oversupplementation.

## 2023-10-28 NOTE — Patient Instructions (Signed)
 It was very nice to see you today!  We will check blood work today.  I will refill your Augmentin.  We will order a bone density scan.  Please keep up the great work with your diet and exercise.  I will see back in year for your next physical.  Come back sooner if needed.  Return in about 1 year (around 10/27/2024) for Annual Physical.   Take care, Dr Jimmey Ralph  PLEASE NOTE:  If you had any lab tests, please let us know if you have not heard back within a few days. You may see your results on mychart before we have a chance to review them but we will give you a call once they are reviewed by [place].   If we ordered any referrals today, please let us know if you have not heard from their office within the next week.   If you had any urgent prescriptions sent in today, please check with the pharmacy within an hour of our visit to make sure the prescription was transmitted appropriately.   Please try these tips to maintain a healthy lifestyle:  Eat at least 3 REAL meals and 1-2 snacks per day.  Aim for no more than 5 hours between eating.  If you eat breakfast, please do so within one hour of getting up.   Each meal should contain half fruits/vegetables, one quarter protein, and one quarter carbs (no bigger than a computer mouse)  Cut down on sweet beverages. This includes juice, soda, and sweet tea.   Drink at least 1 glass of water with each meal and aim for at least 8 glasses per day  Exercise at least 150 minutes every week.

## 2023-10-28 NOTE — Assessment & Plan Note (Addendum)
 And lengthy discussion with patient today regarding his prostate cancer and management.  He is currently following with oncology and urology for this.  He has went through radiation and is now currently on antitestosterone treatment with Orgovyx.  He will be following up with urology next week.  Currently on Keflex for urinary tract infection due to intermittent catheterization.  We will check DEXA to screen for osteoporosis given that he has been on antitestosterone therapy for the last year.

## 2023-10-30 ENCOUNTER — Encounter: Payer: Self-pay | Admitting: Family Medicine

## 2023-10-30 NOTE — Progress Notes (Signed)
 His A1c is in the prediabetic range but trending up.  He does not need to start any medications for this however should continue to work on diet and exercise and we can recheck again in a year or so.  The rest of his labs are all at goal.  We can recheck everything in a year or so.

## 2023-10-31 NOTE — Telephone Encounter (Signed)
 Noted.

## 2023-11-03 DIAGNOSIS — R339 Retention of urine, unspecified: Secondary | ICD-10-CM | POA: Diagnosis not present

## 2023-11-04 DIAGNOSIS — C61 Malignant neoplasm of prostate: Secondary | ICD-10-CM | POA: Diagnosis not present

## 2023-11-04 DIAGNOSIS — R338 Other retention of urine: Secondary | ICD-10-CM | POA: Diagnosis not present

## 2023-11-04 DIAGNOSIS — C775 Secondary and unspecified malignant neoplasm of intrapelvic lymph nodes: Secondary | ICD-10-CM | POA: Diagnosis not present

## 2023-11-05 DIAGNOSIS — L570 Actinic keratosis: Secondary | ICD-10-CM | POA: Diagnosis not present

## 2023-11-05 DIAGNOSIS — L905 Scar conditions and fibrosis of skin: Secondary | ICD-10-CM | POA: Diagnosis not present

## 2023-11-05 DIAGNOSIS — Z85828 Personal history of other malignant neoplasm of skin: Secondary | ICD-10-CM | POA: Diagnosis not present

## 2023-11-05 DIAGNOSIS — D225 Melanocytic nevi of trunk: Secondary | ICD-10-CM | POA: Diagnosis not present

## 2023-11-05 DIAGNOSIS — L821 Other seborrheic keratosis: Secondary | ICD-10-CM | POA: Diagnosis not present

## 2023-11-11 ENCOUNTER — Ambulatory Visit (HOSPITAL_BASED_OUTPATIENT_CLINIC_OR_DEPARTMENT_OTHER)
Admission: RE | Admit: 2023-11-11 | Discharge: 2023-11-11 | Disposition: A | Discharge status: Home / Self Care | Source: Ambulatory Visit | Attending: Family Medicine | Admitting: Family Medicine

## 2023-11-11 ENCOUNTER — Encounter: Payer: Self-pay | Admitting: Family Medicine

## 2023-11-11 DIAGNOSIS — Z0001 Encounter for general adult medical examination with abnormal findings: Secondary | ICD-10-CM | POA: Diagnosis not present

## 2023-11-11 DIAGNOSIS — E349 Endocrine disorder, unspecified: Secondary | ICD-10-CM

## 2023-11-11 DIAGNOSIS — Z8546 Personal history of malignant neoplasm of prostate: Secondary | ICD-10-CM | POA: Insufficient documentation

## 2023-11-11 DIAGNOSIS — Z79818 Long term (current) use of other agents affecting estrogen receptors and estrogen levels: Secondary | ICD-10-CM | POA: Insufficient documentation

## 2023-11-11 DIAGNOSIS — Z13820 Encounter for screening for osteoporosis: Secondary | ICD-10-CM | POA: Diagnosis not present

## 2023-11-11 DIAGNOSIS — E291 Testicular hypofunction: Secondary | ICD-10-CM | POA: Diagnosis not present

## 2023-11-11 DIAGNOSIS — C61 Malignant neoplasm of prostate: Secondary | ICD-10-CM | POA: Insufficient documentation

## 2023-11-11 NOTE — Progress Notes (Signed)
 Great news!  Bone density scan shows normal bone density.  We can recheck again in a couple of years.

## 2023-11-12 ENCOUNTER — Encounter: Payer: Self-pay | Admitting: Family Medicine

## 2023-11-13 NOTE — Telephone Encounter (Signed)
 It is still a good idea to continue with calcium  supplementation with the goal of least 1200 mg daily to help promote healthy bones.  He should also make sure that he is getting 800 international units of vitamin D  daily.

## 2023-11-13 NOTE — Telephone Encounter (Signed)
**Note De-identified  [place] Obfuscation** Please advise 

## 2023-12-01 DIAGNOSIS — R338 Other retention of urine: Secondary | ICD-10-CM | POA: Diagnosis not present

## 2023-12-01 DIAGNOSIS — N401 Enlarged prostate with lower urinary tract symptoms: Secondary | ICD-10-CM | POA: Diagnosis not present

## 2023-12-01 DIAGNOSIS — R339 Retention of urine, unspecified: Secondary | ICD-10-CM | POA: Diagnosis not present

## 2023-12-08 DIAGNOSIS — R8290 Unspecified abnormal findings in urine: Secondary | ICD-10-CM | POA: Diagnosis not present

## 2023-12-15 ENCOUNTER — Ambulatory Visit: Payer: Medicare Other

## 2023-12-17 ENCOUNTER — Ambulatory Visit (INDEPENDENT_AMBULATORY_CARE_PROVIDER_SITE_OTHER)

## 2023-12-17 VITALS — Ht 77.0 in | Wt 209.0 lb

## 2023-12-17 DIAGNOSIS — Z0000 Encounter for general adult medical examination without abnormal findings: Secondary | ICD-10-CM | POA: Diagnosis not present

## 2023-12-17 NOTE — Patient Instructions (Signed)
 Alan Gonzalez , Thank you for taking time out of your busy schedule to complete your Annual Wellness Visit with me. I enjoyed our conversation and look forward to speaking with you again next year. I, as well as your care team,  appreciate your ongoing commitment to your health goals. Please review the following plan we discussed and let me know if I can assist you in the future. Your Game plan/ To Do List    Referrals: If you haven't heard from the office you've been referred to, please reach out to them at the phone provided.   Follow up Visits: Next Medicare AWV with our clinical staff: 12/28/24   Have you seen your provider in the last 6 months (3 months if uncontrolled diabetes)? Yes Next Office Visit with your provider: 10/29/24  Clinician Recommendations:  Aim for 30 minutes of exercise or brisk walking, 6-8 glasses of water , and 5 servings of fruits and vegetables each day.       This is a list of the screening recommended for you and due dates:  Health Maintenance  Topic Date Due   COVID-19 Vaccine (7 - 2024-25 season) 07/08/2023   Medicare Annual Wellness Visit  12/09/2023   Flu Shot  02/20/2024   Lipid (cholesterol) test  10/27/2024   DTaP/Tdap/Td vaccine (4 - Td or Tdap) 10/02/2031   Pneumonia Vaccine  Completed   Hepatitis C Screening  Completed   Zoster (Shingles) Vaccine  Completed   HPV Vaccine  Aged Out   Meningitis B Vaccine  Aged Out   Colon Cancer Screening  Discontinued    Advanced directives: (Copy Requested) Please bring a copy of your health care power of attorney and living will to the office to be added to your chart at your convenience. You can mail to [facility] [address] [place], [place] [address] or email to [email or web address] Advance Care Planning is important because it:  [x]  Makes sure you receive the medical care that is consistent with your values, goals, and preferences  [x]  It provides guidance to your family and  loved ones and reduces their decisional burden about whether or not they are making the right decisions based on your wishes.  Follow the link provided in your after visit summary or read over the paperwork we have mailed to you to help you started getting your Advance Directives in place. If you need assistance in completing these, please reach out to us  so that we can help you!  See attachments for Preventive Care and Fall Prevention Tips.

## 2023-12-17 NOTE — Progress Notes (Signed)
 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 who presents for a Medicare Wellness preventive visit.  As a reminder, Annual Wellness Visits don't include a physical exam, and some assessments may be limited, especially if this visit is performed virtually. We may recommend an in-person follow-up visit with your provider if needed.  Visit Complete: Virtual I connected with  Alan Freund, PhD on 12/17/23 by a audio enabled telemedicine application and verified that I am speaking with the correct person using two identifiers.  Patient Location: Home  Provider Location: Home Office  I discussed the limitations of evaluation and management by telemedicine. The patient expressed understanding and agreed to proceed.  Vital Signs: Because this visit was a virtual/telehealth visit, some criteria may be missing or patient reported. Any vitals not documented were not able to be obtained and vitals that have been documented are patient reported.  VideoDeclined- This patient declined Librarian, academic. Therefore the visit was completed with audio only.  Persons Participating in Visit: Patient.  AWV Questionnaire: No: Patient Medicare AWV questionnaire was not completed prior to this visit.  Cardiac Risk Factors include: advanced age (>63men, >37 women);dyslipidemia;male gender;hypertension     Objective:     Today's Vitals   12/17/23 1439  Weight: 209 lb (94.8 kg)  Height: 6\' 5"  (1.956 m)   Body mass index is 24.78 kg/m.     12/17/2023    2:44 PM 12/27/2022   10:05 AM 12/09/2022    4:16 PM 11/21/2022    9:54 AM 10/07/2022    8:43 AM 05/21/2022    3:38 AM 04/19/2021    1:51 PM  Advanced Directives  Does Patient Have a Medical Advance Directive? Yes Yes Yes Yes Yes No Yes  Type of Estate agent of [place];Living will Living will Healthcare Power of [place];Living will Living will;Healthcare Power of [employer] Power of  [place];Living will  Healthcare Power of [place];Living will  Does patient want to make changes to medical advance directive?   No - Patient declined    No - Patient declined  Copy of Healthcare Power of Attorney in Chart? No - copy requested  Yes - validated most recent copy scanned in chart (See row information)    No - copy requested  Would patient like information on creating a medical advance directive?      No - Patient declined     Current Medications (verified) Outpatient Encounter Medications as of 12/17/2023  Medication Sig   acyclovir  (ZOVIRAX ) 800 MG tablet TAKE 1 TABLET(800 MG) BY MOUTH TWICE DAILY (Patient taking differently: as needed.)   alfuzosin  (UROXATRAL ) 10 MG 24 hr tablet Take 10 mg by mouth every morning.   amLODipine  (NORVASC ) 5 MG tablet TAKE 1 TABLET BY MOUTH EVERY DAY   amoxicillin -clavulanate (AUGMENTIN ) 875-125 MG tablet Take 1 tablet by mouth 2 (two) times daily. As needed for diverticulitis   arginine 500 MG tablet Take 1,200 mg by mouth daily.   Calcium  Carbonate-Vitamin D  (CALCIUM -VITAMIN D  PO) Take by mouth.   cholecalciferol (VITAMIN D ) 1000 units tablet Take 1,000 Units daily by mouth.   CINNAMON PO Take by mouth.   diazepam  (VALIUM ) 5 MG tablet Take 1 tablet (5 mg total) by mouth every 12 (twelve) hours as needed for muscle spasms.   MAGNESIUM CITRATE PO Take 100 mg by mouth daily.   relugolix (ORGOVYX) 120 MG tablet Take 120 mg by mouth every evening.   rosuvastatin  (CRESTOR ) 20 MG tablet TAKE 1  TABLET(20 MG) BY MOUTH DAILY   tadalafil  (CIALIS ) 5 MG tablet Take 1 tablet (5 mg total) by mouth every other day.   TURMERIC PO Take 2,000 mg by mouth daily.   No facility-administered encounter medications on file as of 12/17/2023.    Allergies (verified) Patient has no known allergies.   History: Past Medical History:  Diagnosis Date   Actinic keratosis 10/26/2008   Qualifier: Diagnosis of  By: Bronson Canny MD, Alex     CERVICAL SPINE DISORDER, NOS  09/18/2006   Qualifier: Diagnosis of  By: Bronson Canny MD, Idan     Diverticulitis    Diverticulitis of colon 09/18/2006   Pattern is one to two flairs per year of diverticulitis which has been easily managed by early augmentin  treatment.     Elevated blood sugar 09/09/2014   Elevated PSA    ERECTILE DYSFUNCTION 10/26/2008   Qualifier: Diagnosis of  By: Bronson Canny MD, Alan Gonzalez     Family history of hemochromatosis 09/09/2017   Father had symtomatic, lab proven hemochromatosis.  Likely needs genetic testing.     HYPERCHOLESTEROLEMIA 09/18/2006   Primary prevention: No known CAD, CVA, PVD or DM     Hyperlipidemia    Left ventricular diastolic dysfunction with preserved systolic function 05/16/2015   class I diastolic dysfunction by echo October 2016    Melanoma [facility])    Peripheral neuropathy 09/09/2014   toes   Prostate cancer (HCC)    Recurrent oral herpes simplex infection 09/09/2014   Seborrheic keratoses 08/27/2013   Thrombocytopenia (HCC) 07/26/2013   Has low normal platelet count and seems to have a qualitative platelet defect in that he bleeds very easily - previous normal work up.    Uric acid kidney stone 03/13/2015   Stone analysis done 03/2015 Uric acid stones occur in 10% of all kidney stones and are the second most-common cause of urinary stones after calcium  oxalate and calcium  phosphate calculi. The most important risk factor for uric acid crystallization and stone formation is a low urine pH (below 5.5) rather than an increased urinary uric acid excretion. Main causes of low urine pH are tubular disorders   Vitamin D  deficiency disease 09/09/2014   Wears glasses    Wears hearing aid in both ears    Past Surgical History:  Procedure Laterality Date   basal cell area removed x 2     EYE SURGERY Left    eye hurt by shrub, surgery done   melanoma removed from left wrist     RADIOACTIVE SEED IMPLANT N/A 12/27/2022   Procedure: RADIOACTIVE SEED IMPLANT/BRACHYTHERAPY IMPLANT;   Surgeon: Trent Frizzle, MD;  Location: [facility];  Service: Urology;  Laterality: N/A;  90 MINS   SPACE OAR INSTILLATION N/A 12/27/2022   Procedure: SPACE OAR INSTILLATION;  Surgeon: Trent Frizzle, MD;  [facility];  Service: Urology;  Laterality: N/A;   SPINE SURGERY     1990s L 4 to L 5   Family History  Problem Relation Age of Onset   Cancer Mother        Vulvar   COPD Father    Cancer Sister        breast   Heart disease Sister    Heart disease Brother    Thyroid  disease Sister    Social History   Socioeconomic History   Marital status: Married    Spouse name: Alan Gonzalez   Number of children: 3   Years of education: PHD   Highest education level:  Not on file  Occupational History   Occupation: PHD-physcologist     Employer: [employer]  Tobacco Use   Smoking status: Never   Smokeless tobacco: Never  Vaping Use   Vaping status: Never Used  Substance and Sexual Activity   Alcohol use: Not Currently    Comment: 1 glass wine per week   Drug use: No   Sexual activity: Yes    Comment: monagomous  Other Topics Concern   Not on file  Social History Narrative   Emergency Contact: wife, Alan Gonzalez [phone]   Diet: Pt has a varied diet of protein, starch, and vegetables/fruits   Seatbelts: Pt reports wearing seatbelt when in vehicles.    Sun Exposure/Protection: Pt reports wearing ball cap, long sleeve   Hobbies: golf, exercise, outdoors, reading       Patient lives with wife Alan Gonzalez) in two level home 03/23/2020   Transportation: Patient has own vehicle and drives himself 03/23/2020   Important Relationships Spouse, kids, grandkids, friends  03/23/2020   Pets: None 03/23/2020   Education / Work:  PhD/ Psychologist 03/23/2020   Interests / Fun: Golf, travel, keep busy, pickle ball 04/19/2021   Current Stressors: Minor life hassles 03/23/2020   Religious / Personal Beliefs: Catholic 03/23/2020                                                                                                    Social Drivers of Health   Financial Resource Strain: Low Risk  (12/17/2023)   Overall Financial Resource Strain (CARDIA)    Difficulty of Paying Living Expenses: Not hard at all  Food Insecurity: No Food Insecurity (12/17/2023)   Hunger Vital Sign    Worried About Running Out of Food in the Last Year: Never true    Ran Out of Food in the Last Year: Never true  Transportation Needs: No Transportation Needs (12/17/2023)   PRAPARE - Administrator, Civil Service (Medical): No    Lack of Transportation (Non-Medical): No  Physical Activity: Sufficiently Active (12/17/2023)   Exercise Vital Sign    Days of Exercise per Week: 5 days    Minutes of Exercise per Session: 150+ min  Stress: No Stress Concern Present (12/17/2023)   [employer] of Occupational Health - Occupational Stress Questionnaire    Feeling of Stress : Not at all  Social Connections: Moderately Integrated (12/17/2023)   Social Connection and Isolation Panel [NHANES]    Frequency of Communication with Friends and Family: More than three times a week    Frequency of Social Gatherings with Friends and Family: More than three times a week    Attends Religious Services: Never    Database administrator or Organizations: Yes    Attends Banker Meetings: 1 to 4 times per year    Marital Status: Married    Tobacco Counseling Counseling given: Not Answered    Clinical Intake:  Pre-visit preparation completed: Yes  Pain : No/denies pain     BMI - recorded: 24.78 Nutritional Status: BMI of 19-24  Normal Diabetes: No  Lab Results  Component Value Date   HGBA1C 6.3 10/28/2023   HGBA1C 6.0 10/22/2022   HGBA1C 5.4 09/25/2020     How often do you need to have someone help you when you read instructions, pamphlets, or other written materials from your doctor or pharmacy?: 1 - Never  Interpreter Needed?:  No  Information entered by :: Lamont Pilsner, LPN   Activities of Daily Living     12/17/2023    2:41 PM 12/27/2022   10:12 AM  In your present state of health, do you have any difficulty performing the following activities:  Hearing? 1 1  Comment hearing aids   Vision? 0 0  Difficulty concentrating or making decisions? 0 0  Walking or climbing stairs? 0 0  Dressing or bathing? 0 0  Doing errands, shopping? 0   Preparing Food and eating ? N   Using the Toilet? N   In the past six months, have you accidently leaked urine? N   Do you have problems with loss of bowel control? N   Managing your Medications? N   Managing your Finances? N   Housekeeping or managing your Housekeeping? N     Patient Care Team: Rodney Clamp, MD as PCP - General (Family Medicine) Maris Sickle, MD (Ophthalmology) Levora Reas (Dentistry) Katheleen Palmer, RN as Oncology Nurse Navigator Manny, Harvey Linen., MD as Consulting Physician (Urology) Kenith Payer, MD as Consulting Physician (Radiation Oncology) Neda Balk, RN as Registered Nurse  Indicate any recent Medical Services you may have received from other than [facility] providers in the past year (date may be approximate).     Assessment:    This is a routine wellness examination for Alan Gonzalez.  Hearing/Vision screen Hearing Screening - Comments:: Pt has hearing aids  Vision Screening - Comments:: Wears rx glasses - up to date with routine eye exams with Dr Candi Chafe     Goals Addressed             This Visit's Progress    Patient Stated       Overcome prostrate DX        Depression Screen      12/17/2023    2:44 PM 05/14/2023   11:05 AM 12/09/2022    4:15 PM 10/22/2022    9:45 AM 10/07/2022    8:53 AM 10/01/2021    8:32 AM 04/19/2021    1:52 PM  PHQ 2/9 Scores  PHQ - 2 Score 0 0 0 0 0 0 0  PHQ- 9 Score  1    0     Fall Risk      12/17/2023    2:45 PM 05/14/2023   11:05 AM 12/05/2022    7:44 PM 10/22/2022    9:45 AM 04/19/2021     1:52 PM  Fall Risk   Falls in the past year? 0 1 0 0 0  Number falls in past yr: 0 0 0 0 0  Injury with Fall? 0 1 0 0 0  Risk for fall due to : No Fall Risks History of fall(s) Impaired vision No Fall Risks No Fall Risks  Follow up Falls prevention discussed Falls evaluation completed Falls prevention discussed  Falls prevention discussed    MEDICARE RISK AT HOME:   Medicare Risk at Home Any stairs in or around the home?: Yes If so, are there any without handrails?: No Home free of loose throw rugs in walkways, pet beds, electrical cords, etc?: Yes Adequate lighting in your  home to reduce risk of falls?: Yes Life alert?: No Use of a cane, walker or w/c?: No Grab bars in the bathroom?: Yes Shower chair or bench in shower?: Yes Elevated toilet seat or a handicapped toilet?: Yes  TIMED UP AND GO:  Was the test performed?  No  Cognitive Function: 6CIT completed    08/23/2013    4:00 PM 04/23/2012   10:00 AM  MMSE - Mini Mental State Exam  Orientation to time 5 5  Orientation to Place 5 5  Registration 3 3  Attention/ Calculation 5 5  Recall 3 3  Language- name 2 objects 2 2  Language- repeat 1 1  Language- follow 3 step command 3 3  Language- read & follow direction 1 1  Write a sentence 1 1  Copy design 1 1  Total score 30 30        12/17/2023    2:46 PM 12/09/2022    4:17 PM 04/19/2021    1:56 PM 03/23/2020    1:52 PM 05/10/2019    9:01 AM  6CIT Screen  What Year? 0 points 0 points 0 points 0 points 0 points  What month? 0 points 0 points 0 points 0 points 0 points  What time? 0 points 0 points 0 points 0 points 0 points  Count back from 20 0 points 0 points 0 points 0 points 0 points  Months in reverse 0 points 0 points 0 points 0 points 0 points  Repeat phrase 0 points 0 points 0 points 0 points 0 points  Total Score 0 points 0 points 0 points 0 points 0 points    Immunizations Immunization History  Administered Date(s) Administered   Fluad Quad(high Dose  65+) 05/04/2019   Influenza Split 04/09/2012   Influenza Whole 05/24/2008   Influenza, High Dose Seasonal PF 05/13/2023   Influenza,inj,Quad PF,6+ Mos 06/02/2014, 05/03/2015   Influenza-Unspecified 05/03/2013, 06/05/2016, 04/25/2017, 04/21/2018, 05/05/2019, 05/10/2020   Novavax(Covid-19) Vaccine 05/13/2023   PFIZER(Purple Top)SARS-COV-2 Vaccination 08/10/2019, 08/30/2019, 11/07/2020, 03/29/2021   Pfizer Covid-19 Vaccine Bivalent Booster 44yrs & up 03/22/2021   Pneumococcal Conjugate-13 08/27/2013   Pneumococcal Polysaccharide-23 08/21/2012   Respiratory Syncytial Virus Vaccine,Recomb Aduvanted(Arexvy) 05/13/2023   Td 02/20/1999   Tdap 08/09/2011, 10/01/2021   Unspecified SARS-COV-2 Vaccination 04/16/2020   Zoster Recombinant(Shingrix) 09/13/2020, 03/03/2021   Zoster, Live 10/27/2008    Screening Tests Health Maintenance  Topic Date Due   COVID-19 Vaccine (7 - 2024-25 season) 07/08/2023   INFLUENZA VACCINE  02/20/2024   LIPID PANEL  10/27/2024   Medicare Annual Wellness (AWV)  12/16/2024   DTaP/Tdap/Td (4 - Td or Tdap) 10/02/2031   Pneumonia Vaccine 11+ Years old  Completed   Hepatitis C Screening  Completed   Zoster Vaccines- Shingrix  Completed   HPV VACCINES  Aged Out   Meningococcal B Vaccine  Aged Out   Colonoscopy  Discontinued    Health Maintenance  Health Maintenance Due  Topic Date Due   COVID-19 Vaccine (7 - 2024-25 season) 07/08/2023   Health Maintenance Items Addressed: See Nurse Notes  Additional Screening:  Vision Screening: Recommended annual ophthalmology exams for early detection of glaucoma and other disorders of the eye.  Dental Screening: Recommended annual dental exams for proper oral hygiene  Community Resource Referral / Chronic Care Management: CRR required this visit?  No   CCM required this visit?  No   Plan:    I have personally reviewed and noted the following in the patient's chart:   Medical and  social history Use of alcohol,  tobacco or illicit drugs  Current medications and supplements including opioid prescriptions. Patient is not currently taking opioid prescriptions. Functional ability and status Nutritional status Physical activity Advanced directives List of other physicians Hospitalizations, surgeries, and ER visits in previous 12 months Vitals Screenings to include cognitive, depression, and falls Referrals and appointments  In addition, I have reviewed and discussed with patient certain preventive protocols, quality metrics, and best practice recommendations. A written personalized care plan for preventive services as well as general preventive health recommendations were provided to patient.   Bruno Capri, LPN   5/64/3329   After Visit Summary: (MyChart) Due to this being a telephonic visit, the after visit summary with patients personalized plan was offered to patient via MyChart   Notes: Nothing significant to report at this time.

## 2023-12-29 DIAGNOSIS — R339 Retention of urine, unspecified: Secondary | ICD-10-CM | POA: Diagnosis not present

## 2024-01-12 ENCOUNTER — Other Ambulatory Visit: Payer: Self-pay | Admitting: *Deleted

## 2024-01-12 DIAGNOSIS — I10 Essential (primary) hypertension: Secondary | ICD-10-CM

## 2024-01-12 MED ORDER — AMLODIPINE BESYLATE 5 MG PO TABS
5.0000 mg | ORAL_TABLET | Freq: Every day | ORAL | 1 refills | Status: AC
Start: 2024-01-12 — End: ?

## 2024-01-16 ENCOUNTER — Ambulatory Visit (INDEPENDENT_AMBULATORY_CARE_PROVIDER_SITE_OTHER): Admitting: Internal Medicine

## 2024-01-16 ENCOUNTER — Ambulatory Visit: Payer: Self-pay

## 2024-01-16 VITALS — BP 134/71 | HR 50 | Temp 97.9°F | Ht 77.0 in | Wt 210.0 lb

## 2024-01-16 DIAGNOSIS — A6920 Lyme disease, unspecified: Secondary | ICD-10-CM

## 2024-01-16 MED ORDER — DOXYCYCLINE HYCLATE 100 MG PO TABS: 100.0000 mg | ORAL_TABLET | Freq: Two times a day (BID) | ORAL | 0 refills | Status: AC

## 2024-01-16 NOTE — Progress Notes (Signed)
 ==============================  [facility]: [phone]   -- Medical Office Visit --  Patient: Alan Na, PhD      Age: 78 y.o.       Sex:  male  Date:   01/16/2024 Today's Healthcare Provider: Bernardino KANDICE Cone, MD  ==============================   Chief Complaint: Rash (Pt stated that he has a rash on the Rt leg and has been there for the past 7/10 days)   Discussed the use of AI scribe software for clinical note transcription with the patient, who gave verbal consent to proceed.  History of Present Illness [name], PhD Alan Gonzalez is a 78 year old male who presents with a rash on the right back of the knee.  The rash on the right back of the knee was first noticed between seven and ten days ago. It has not been steadily worsening and is possibly improving. The rash has not significantly spread or migrated. He does not recall any sensation of a bite but was working outside around the time the rash appeared, wearing long pants. Initially, the rash was more blue and purple in color two to three days ago and has since improved.  He has not experienced any other symptoms apart from his usual hot flashes related to prostate cancer treatment. No joint pain or other systemic symptoms.  Background Reviewed: Problem List: has Dyslipidemia; Diverticulitis of colon; ERECTILE DYSFUNCTION; ACTINIC KERATOSIS; Musculoskeletal disorder and symptoms referable to neck; Chronic bilateral low back pain without sciatica; Thrombocytopenia (HCC); Seborrheic keratoses; Cold sore; Peripheral neuropathy; Vitamin D  deficiency disease; Elevated blood sugar; Uric acid kidney stone; Left ventricular diastolic dysfunction with preserved systolic function; Hypertension, essential; Family history of hemochromatosis; Decreased hearing of both ears; Bradycardia; and Prostate cancer (HCC) on their problem list. Past Medical History:  has a past medical history of Actinic  keratosis (10/26/2008), CERVICAL SPINE DISORDER, NOS (09/18/2006), Diverticulitis, Diverticulitis of colon (09/18/2006), Elevated blood sugar (09/09/2014), Elevated PSA, ERECTILE DYSFUNCTION (10/26/2008), Family history of hemochromatosis (09/09/2017), HYPERCHOLESTEROLEMIA (09/18/2006), Hyperlipidemia, Left ventricular diastolic dysfunction with preserved systolic function (05/16/2015), Melanoma (HCC), Peripheral neuropathy (09/09/2014), Prostate cancer (HCC), Recurrent oral herpes simplex infection (09/09/2014), Seborrheic keratoses (08/27/2013), Thrombocytopenia (HCC) (07/26/2013), Uric acid kidney stone (03/13/2015), Vitamin D  deficiency disease (09/09/2014), Wears glasses, and Wears hearing aid in both ears. Past Surgical History:   has a past surgical history that includes Spine surgery; Eye surgery (Left); melanoma removed from left wrist; basal cell area removed x 2; Radioactive seed implant (N/A, 12/27/2022); and SPACE OAR INSTILLATION (N/A, 12/27/2022). Social History:   reports that he has never smoked. He has never used smokeless tobacco. He reports that he does not currently use alcohol. He reports that he does not use drugs. Family History:  family history includes COPD in his father; Cancer in his mother and sister; Heart disease in his brother and sister; Thyroid  disease in his sister. Allergies:  has no known allergies.   Medication Reconciliation: Current Outpatient Medications on File Prior to Visit  Medication Sig   acyclovir  (ZOVIRAX ) 800 MG tablet TAKE 1 TABLET(800 MG) BY MOUTH TWICE DAILY (Patient taking differently: as needed.)   alfuzosin  (UROXATRAL ) 10 MG 24 hr tablet Take 10 mg by mouth every morning.   amLODipine  (NORVASC ) 5 MG tablet Take 1 tablet (5 mg total) by mouth daily.   amoxicillin -clavulanate (AUGMENTIN ) 875-125 MG tablet Take 1 tablet by mouth 2 (two) times daily. As needed for diverticulitis   arginine 500 MG tablet Take 1,200 mg by  mouth daily.   Calcium   Carbonate-Vitamin D  (CALCIUM -VITAMIN D  PO) Take by mouth.   cholecalciferol (VITAMIN D ) 1000 units tablet Take 1,000 Units daily by mouth.   CINNAMON PO Take by mouth.   diazepam  (VALIUM ) 5 MG tablet Take 1 tablet (5 mg total) by mouth every 12 (twelve) hours as needed for muscle spasms.   MAGNESIUM CITRATE PO Take 100 mg by mouth daily.   relugolix (ORGOVYX) 120 MG tablet Take 120 mg by mouth every evening.   rosuvastatin  (CRESTOR ) 20 MG tablet TAKE 1 TABLET(20 MG) BY MOUTH DAILY   tadalafil  (CIALIS ) 5 MG tablet Take 1 tablet (5 mg total) by mouth every other day.   TURMERIC PO Take 2,000 mg by mouth daily.   No current facility-administered medications on file prior to visit.  There are no discontinued medications.   Physical Exam:    01/16/2024    3:48 PM 12/17/2023    2:39 PM 10/28/2023    8:03 AM  Vitals with BMI  Height 6' 5 6' 5 6' 6  Weight 210 lbs 209 lbs 209 lbs 13 oz  BMI 24.9 24.78 24.25  Systolic 134  136  Diastolic 71  70  Pulse 50  101  Vital signs reviewed.  Nursing notes reviewed. Weight trend reviewed. Physical Exam General Appearance:  No acute distress appreciable.   Well-groomed, healthy-appearing male.  Well proportioned with no abnormal fat distribution.  Good muscle tone. Pulmonary:  Normal work of breathing at rest, no respiratory distress apparent. SpO2: 97 %  Musculoskeletal: All extremities are intact.  Neurological:  Awake, alert, oriented, and engaged.  No obvious focal neurological deficits or cognitive impairments.  Sensorium seems unclouded.   Speech is clear and coherent with logical content. Psychiatric:  Appropriate mood, pleasant and cooperative demeanor, thoughtful and engaged during the exam Physical Exam SKIN: No pus in the rash on the right posterior knee. Hard nodule present under central bite on right posterior knee. No erythema around the bite on right posterior knee. Photographs Taken 01/17/2024 :     Results:    01/16/2024    3:45  PM 12/17/2023    2:44 PM 05/14/2023   11:05 AM 12/09/2022    4:15 PM  PHQ 2/9 Scores  PHQ - 2 Score 0 0 0 0  PHQ- 9 Score   1        ASSESSMENT & PLAN   Assessment & Plan Erythema migrans (Lyme disease) An erythema migrans rash is present on the right back of the knee for 7-10 days, initially blue and purple, now improving. Differential diagnosis includes Lyme disease, copperhead bite, brown recluse bite, and other tick-borne diseases such as amblyoma Americana. Empirical treatment with doxycycline  is initiated due to its high cure rate for Lyme disease and unreliable and delayed serology tests. Potential progression to joint pain, strokes, and heart problems if untreated was discussed.  Overall  analysis supports Lyme disease or a similar tick-borne illness. A brown recluse bite was considered, but most heal without intervention and no eschar is present. Prescribe doxycycline  100 mg, 42 tablets, twice daily for 21 days. Advise taking doxycycline  with a full glass of water  to prevent esophagitis and to avoid sun exposure. Discuss serology testing for Lyme disease and other tick-borne illnesses, noting it is not required as it does not change management. Submit a photo of the rash to AI for further analysis. Advise monitoring for joint pain and other symptoms of Lyme disease. Send prescription to [facility] on physical church  and Monday.   ORDER ASSOCIATIONS  #   DIAGNOSIS / CONDITION ICD-10 ENCOUNTER ORDER     ICD-10-CM   1. Erythema migrans (Lyme disease)  A69.20 doxycycline  (VIBRA -TABS) 100 MG tablet           Orders Placed in Encounter:   Lab Orders  No laboratory test(s) ordered today   Imaging Orders  No imaging studies ordered today   Referral Orders  No referral(s) requested today   Meds ordered this encounter  Medications   doxycycline  (VIBRA -TABS) 100 MG tablet    Sig: Take 1 tablet (100 mg total) by mouth 2 (two) times daily.    Dispense:  42 tablet    Refill:  0     No orders of the defined types were placed in this encounter.  ED Discharge Orders          Ordered    doxycycline  (VIBRA -TABS) 100 MG tablet  2 times daily        01/16/24 1609              This document was synthesized by artificial intelligence (Abridge) using HIPAA-compliant recording of the clinical interaction;   We discussed the use of AI scribe software for clinical note transcription with the patient, who gave verbal consent to proceed. additional Info: This encounter employed state-of-the-art, real-time, collaborative documentation. The patient actively reviewed and assisted in updating their electronic medical record on a shared screen, ensuring transparency and facilitating joint problem-solving for the problem list, overview, and plan. This approach promotes accurate, informed care. The treatment plan was discussed and reviewed in detail, including medication safety, potential side effects, and all patient questions. We confirmed understanding and comfort with the plan. Follow-up instructions were established, including contacting the office for any concerns, returning if symptoms worsen, persist, or new symptoms develop, and precautions for potential emergency department visits.

## 2024-01-16 NOTE — Telephone Encounter (Signed)
 FYI Only or Action Required?: Action required by provider: request for appointment.  Patient was last seen in primary care on 10/28/2023 by Kennyth Worth HERO, MD. Called Nurse Triage reporting Rash and Insect Bite. Symptoms began several weeks ago. Interventions attempted: Nothing. Symptoms are: gradually worsening.  Triage Disposition: See Physician Within 24 Hours  Patient/caregiver understands and will follow disposition?: YesCopied from CRM #[id number]. Topic: Clinical - Red Word Triage >> Jan 16, 2024  8:08 AM Marissa P wrote: Red Word that prompted transfer to Nurse Triage: Patient has a rash located on back of right thigh, dark colored, white mark in middle not itchy or anything like that. 7-10 days now. Concerned about lime disease. Reason for Disposition  [1] Red or very tender (to touch) area AND [2] getting larger over 48 hours after the bite  Answer Assessment - Initial Assessment Questions 1. TYPE of INSECT: What type of insect was it?      Not sure  2. ONSET: When did you get bitten?      7-10 days ago  3. LOCATION: Where is the insect bite located?      Right-back thigh 4. REDNESS: Is the area red or pink? If Yes, ask: What size is area of redness? (inches or cm). When did the redness start?     Red-2 inches 5. PAIN: Is there any pain? If Yes, ask: How bad is it?  (Scale 1-10; or mild, moderate, severe)     denies 6. ITCHING: Does it itch? If Yes, ask: How bad is the itch?    - MILD: doesn't interfere with normal activities   - MODERATE-SEVERE: interferes with work, school, sleep, or other activities      Denies  7. SWELLING: How big is the swelling? (inches, cm, or compare to coins)     Denies  8. OTHER SYMPTOMS: Do you have any other symptoms?  (e.g., difficulty breathing, hives)     Fatigue  Pt has prostate cancer and always feels fatigued. Pt stated the rash looks worse as time goes on. Rash has a bullseye. Pt did not remember if bitten but stated it  looks like it could be a tick bite.  Protocols used: Insect Bite-A-AH

## 2024-01-16 NOTE — Telephone Encounter (Signed)
 Noted, appt today.

## 2024-01-27 DIAGNOSIS — R339 Retention of urine, unspecified: Secondary | ICD-10-CM | POA: Diagnosis not present

## 2024-02-09 DIAGNOSIS — R338 Other retention of urine: Secondary | ICD-10-CM | POA: Diagnosis not present

## 2024-02-09 DIAGNOSIS — R300 Dysuria: Secondary | ICD-10-CM | POA: Diagnosis not present

## 2024-02-09 DIAGNOSIS — C775 Secondary and unspecified malignant neoplasm of intrapelvic lymph nodes: Secondary | ICD-10-CM | POA: Diagnosis not present

## 2024-02-09 DIAGNOSIS — C61 Malignant neoplasm of prostate: Secondary | ICD-10-CM | POA: Diagnosis not present

## 2024-03-16 DIAGNOSIS — C775 Secondary and unspecified malignant neoplasm of intrapelvic lymph nodes: Secondary | ICD-10-CM | POA: Diagnosis not present

## 2024-03-16 DIAGNOSIS — R338 Other retention of urine: Secondary | ICD-10-CM | POA: Diagnosis not present

## 2024-03-16 DIAGNOSIS — C61 Malignant neoplasm of prostate: Secondary | ICD-10-CM | POA: Diagnosis not present

## 2024-03-16 DIAGNOSIS — R300 Dysuria: Secondary | ICD-10-CM | POA: Diagnosis not present

## 2024-03-16 DIAGNOSIS — C519 Malignant neoplasm of vulva, unspecified: Secondary | ICD-10-CM | POA: Diagnosis not present

## 2024-03-16 DIAGNOSIS — R310 Gross hematuria: Secondary | ICD-10-CM | POA: Diagnosis not present

## 2024-03-23 DIAGNOSIS — R339 Retention of urine, unspecified: Secondary | ICD-10-CM | POA: Diagnosis not present

## 2024-03-31 DIAGNOSIS — N200 Calculus of kidney: Secondary | ICD-10-CM | POA: Diagnosis not present

## 2024-03-31 DIAGNOSIS — K5730 Diverticulosis of large intestine without perforation or abscess without bleeding: Secondary | ICD-10-CM | POA: Diagnosis not present

## 2024-03-31 DIAGNOSIS — R310 Gross hematuria: Secondary | ICD-10-CM | POA: Diagnosis not present

## 2024-03-31 DIAGNOSIS — N281 Cyst of kidney, acquired: Secondary | ICD-10-CM | POA: Diagnosis not present

## 2024-04-30 ENCOUNTER — Encounter: Payer: Self-pay | Admitting: Family Medicine

## 2024-05-04 DIAGNOSIS — C61 Malignant neoplasm of prostate: Secondary | ICD-10-CM | POA: Diagnosis not present

## 2024-05-04 DIAGNOSIS — R339 Retention of urine, unspecified: Secondary | ICD-10-CM | POA: Diagnosis not present

## 2024-05-04 DIAGNOSIS — N3040 Irradiation cystitis without hematuria: Secondary | ICD-10-CM | POA: Diagnosis not present

## 2024-05-11 DIAGNOSIS — K080 Exfoliation of teeth due to systemic causes: Secondary | ICD-10-CM | POA: Diagnosis not present

## 2024-05-22 ENCOUNTER — Emergency Department (HOSPITAL_BASED_OUTPATIENT_CLINIC_OR_DEPARTMENT_OTHER)

## 2024-05-22 ENCOUNTER — Other Ambulatory Visit: Payer: Self-pay

## 2024-05-22 ENCOUNTER — Emergency Department (HOSPITAL_BASED_OUTPATIENT_CLINIC_OR_DEPARTMENT_OTHER)
Admission: EM | Admit: 2024-05-22 | Discharge: 2024-05-22 | Disposition: A | Discharge status: Home / Self Care | Attending: Emergency Medicine | Admitting: Emergency Medicine

## 2024-05-22 DIAGNOSIS — S51012A Laceration without foreign body of left elbow, initial encounter: Secondary | ICD-10-CM | POA: Insufficient documentation

## 2024-05-22 DIAGNOSIS — Z043 Encounter for examination and observation following other accident: Secondary | ICD-10-CM | POA: Diagnosis not present

## 2024-05-22 DIAGNOSIS — W51XXXA Accidental striking against or bumped into by another person, initial encounter: Secondary | ICD-10-CM | POA: Diagnosis not present

## 2024-05-22 DIAGNOSIS — M25532 Pain in left wrist: Secondary | ICD-10-CM | POA: Diagnosis not present

## 2024-05-22 DIAGNOSIS — M19032 Primary osteoarthritis, left wrist: Secondary | ICD-10-CM | POA: Diagnosis not present

## 2024-05-22 DIAGNOSIS — S60222A Contusion of left hand, initial encounter: Secondary | ICD-10-CM | POA: Insufficient documentation

## 2024-05-22 DIAGNOSIS — Z79899 Other long term (current) drug therapy: Secondary | ICD-10-CM | POA: Diagnosis not present

## 2024-05-22 DIAGNOSIS — S59902A Unspecified injury of left elbow, initial encounter: Secondary | ICD-10-CM | POA: Diagnosis not present

## 2024-05-22 NOTE — ED Triage Notes (Signed)
 Pt POV reporting L wrist pain after falling while playing pickle ball. No obvious deformity, did not hit head.

## 2024-05-22 NOTE — ED Notes (Signed)
 RN reviewed discharge instructions with pt. Pt verbalized understanding and had no further questions. VSS upon discharge.

## 2024-05-22 NOTE — ED Provider Notes (Signed)
 [place] EMERGENCY DEPARTMENT AT [facility] Provider Note   CSN: [id number] Arrival date & time: 05/22/24  1722     Patient presents with: Wrist Pain   Alan Na, PhD is a 78 y.o. male who presents with concern for left wrist pain that began after pickleball today.  He states his pickleball partner ran into him, causing him to fall onto his left wrist.  Denies any numbness or tingling to hand.  Denies any difficulty with range of motion of the wrist.  He denies hitting his head or having any loss consciousness.    Wrist Pain       Prior to Admission medications   Medication Sig Start Date End Date Taking? Authorizing Provider  acyclovir  (ZOVIRAX ) 800 MG tablet TAKE 1 TABLET(800 MG) BY MOUTH TWICE DAILY Patient taking differently: as needed. 04/16/21   Scarlet Elsie LABOR, MD  alfuzosin  (UROXATRAL ) 10 MG 24 hr tablet Take 10 mg by mouth every morning. 01/18/23   [provider]  amLODipine  (NORVASC ) 5 MG tablet Take 1 tablet (5 mg total) by mouth daily. 01/12/24   Kennyth Worth HERO, MD  amoxicillin -clavulanate (AUGMENTIN ) 875-125 MG tablet Take 1 tablet by mouth 2 (two) times daily. As needed for diverticulitis 10/28/23   Kennyth Worth HERO, MD  arginine 500 MG tablet Take 1,200 mg by mouth daily.    [provider]  Calcium  Carbonate-Vitamin D  (CALCIUM -VITAMIN D  PO) Take by mouth.    [provider]  cholecalciferol (VITAMIN D ) 1000 units tablet Take 1,000 Units daily by mouth.    [provider]  CINNAMON PO Take by mouth.    [provider]  diazepam  (VALIUM ) 5 MG tablet Take 1 tablet (5 mg total) by mouth every 12 (twelve) hours as needed for muscle spasms. 10/22/22   Kennyth Worth HERO, MD  doxycycline  (VIBRA -TABS) 100 MG tablet Take 1 tablet (100 mg total) by mouth 2 (two) times daily. 01/16/24   Alan Bernardino MATSU, MD  MAGNESIUM CITRATE PO Take 100 mg by mouth daily.    [provider]  relugolix (ORGOVYX) 120 MG tablet Take  120 mg by mouth every evening.    [provider]  rosuvastatin  (CRESTOR ) 20 MG tablet TAKE 1 TABLET(20 MG) BY MOUTH DAILY 07/24/23   Kennyth Worth HERO, MD  tadalafil  (CIALIS ) 5 MG tablet Take 1 tablet (5 mg total) by mouth every other day. 10/22/22   Kennyth Worth HERO, MD  TURMERIC PO Take 2,000 mg by mouth daily.    [provider]    Allergies: Patient has no known allergies.    Review of Systems  Musculoskeletal:        Left wrist pain    Updated Vital Signs BP 139/73   Pulse 69   Temp 98 F (36.7 C) (Temporal)   Resp 19   Ht 6' 5 (1.956 m)   Wt 93 kg   SpO2 98%   BMI 24.31 kg/m   Physical Exam Vitals and nursing note reviewed.  Constitutional:      Appearance: Normal appearance.  HENT:     Head: Normocephalic and atraumatic.  Cardiovascular:     Comments: 2+ radial pulse bilaterally Pulmonary:     Effort: Pulmonary effort is normal.  Musculoskeletal:     Comments: Left upper extremity:  General Skin tear to the left elbow.  Bleeding well controlled, no foreign debris. No erythema, edema. Mild contusion to thenar eminence of left hand  Palpation Mildly tender over the soft tissues  of the radius side of the wrist No tenderness to palpation of the radius or ulna Nontender of the carpal bones diffusely, no snuffbox TTP Nontender over the 1st through 5th metacarpals, 1st through 5th phalanges  ROM Full flexion extension at the wrist Full flexion extension at the 1st through 5th MCPs, PIPs, DIPs   Sensation: Sensation intact throughout the 1st-5th digits   Neurological:     General: No focal deficit present.     Mental Status: He is alert.  Psychiatric:        Mood and Affect: Mood normal.        Behavior: Behavior normal.     (all labs ordered are listed, but only abnormal results are displayed) Labs Reviewed - No data to display  EKG: None  Radiology: DG Wrist Complete Left Result Date: 05/22/2024 CLINICAL DATA:  [name] EXAM: LEFT  WRIST - COMPLETE 3+ VIEW COMPARISON:  None Available. FINDINGS: Frontal, oblique, and lateral views of the left wrist are obtained. No acute fracture, subluxation, or dislocation. Mild joint space narrowing within the radial aspect of the carpus. Soft tissues are unremarkable. IMPRESSION: 1. No acute displaced fracture. 2. 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 Electronically Signed   By: Alan Gonzalez M.D.   On: 05/22/2024 18:15     Procedures   Medications Ordered in the ED - No data to display                                  Medical Decision Making Amount and/or Complexity of Data Reviewed Radiology: ordered.    Differential diagnosis includes but is not limited to fracture, dislocation, sprain, strain, contusion, laceration, nerve injury, vascular injury, compartment syndrome  ED Course:  Upon initial evaluation, patient is well-appearing, no acute distress.  Normal vital signs.  Has mild pain to the soft tissues over the radial aspect of the left hand and into the thenar eminence.  No point bony tenderness to palpation over the radius, ulna, carpals, 1st through 5th metacarpals or phalanges.  He has full range of motion of the left elbow, left wrist, left 1st through 5th digits.  Neurovascularly intact in the left upper extremity. Skin tear to the left forearm was cleaned with water  prior to arrival per patient.  Appears clean on exam.  I have dressed the area with antibiotic ointment and it was dressed with Band-Aid.  His tetanus was last updated in 2023, no indication for tdap today.   Imaging Studies ordered: I ordered imaging studies including x-ray left wrist I independently visualized the imaging with scope of interpretation limited to determining acute life threatening conditions related to emergency care. Imaging showed  IMPRESSION:  1. No acute displaced fracture.  2. 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   I agree  with the radiologist interpretation   Medications Given: None  Imaging was reviewed which revealed no acute abnormality.  Suspect he may have a mild sprain of the wrist due to area of pain.  He is neurovascularly intact, pain well-controlled.  Patient stable and appropriate for discharge home.     Impression: Left wrist pain  Disposition:  Patient discharged home with instructions to take Tylenol  as needed for pain.  Ice and elevate the wrist to help with pain and swelling, then switch to heat after 24 hours. Return precautions given and patient verbalized understanding.    Record Review:  External records from outside source obtained and reviewed including tetanus records     This chart was dictated using voice recognition software, Dragon. Despite the best efforts of this provider to proofread and correct errors, errors may still occur which can change documentation meaning.       Final diagnoses:  Acute pain of left wrist    ED Discharge Orders     None          Veta Palma, DEVONNA 05/22/24 1904    Jerrol Agent, MD 05/22/24 1907

## 2024-05-22 NOTE — Discharge Instructions (Addendum)
 Your x-ray does not show any fracture or dislocation.  You may have a mild wrist sprain. Avoid movements and activities that are painful in the first couple of days after the injury. Elevate the area of injury if able to help limit swelling. Apply ice to the area for the first 24 hours, then switch to heat.   You may take up to 1000mg  of tylenol  every 6 hours as needed for pain. Do not take more then 4g per day.   Gradually return to activity as pain allows. Try to engage in non-painful types of physical activity/exercise to increase blood flow to your area of injury.  Return to the ER for any numbness, uncontrolled pain, any other new or concerning symptoms

## 2024-05-31 DIAGNOSIS — H25812 Combined forms of age-related cataract, left eye: Secondary | ICD-10-CM | POA: Diagnosis not present

## 2024-05-31 DIAGNOSIS — H04123 Dry eye syndrome of bilateral lacrimal glands: Secondary | ICD-10-CM | POA: Diagnosis not present

## 2024-05-31 DIAGNOSIS — H43813 Vitreous degeneration, bilateral: Secondary | ICD-10-CM | POA: Diagnosis not present

## 2024-05-31 DIAGNOSIS — H2511 Age-related nuclear cataract, right eye: Secondary | ICD-10-CM | POA: Diagnosis not present

## 2024-06-01 ENCOUNTER — Other Ambulatory Visit: Payer: Self-pay | Admitting: Urology

## 2024-06-09 DIAGNOSIS — R339 Retention of urine, unspecified: Secondary | ICD-10-CM | POA: Diagnosis not present

## 2024-07-05 ENCOUNTER — Other Ambulatory Visit: Payer: Self-pay | Admitting: Family Medicine

## 2024-07-05 DIAGNOSIS — I10 Essential (primary) hypertension: Secondary | ICD-10-CM

## 2024-08-12 ENCOUNTER — Other Ambulatory Visit: Payer: Self-pay | Admitting: Family Medicine

## 2024-08-12 DIAGNOSIS — I10 Essential (primary) hypertension: Secondary | ICD-10-CM

## 2024-08-24 ENCOUNTER — Other Ambulatory Visit: Payer: Self-pay | Admitting: Family Medicine

## 2024-10-29 ENCOUNTER — Encounter: Admitting: Family Medicine

## 2024-12-28 ENCOUNTER — Ambulatory Visit
